# Patient Record
Sex: Female | Born: 1955
Health system: Southern US, Community
[De-identification: ages and names within clinical notes are randomized; demographics above are authoritative.]

## PROBLEM LIST (undated history)

## (undated) DIAGNOSIS — Z9889 Other specified postprocedural states: Secondary | ICD-10-CM

## (undated) DIAGNOSIS — T4145XA Adverse effect of unspecified anesthetic, initial encounter: Secondary | ICD-10-CM

## (undated) DIAGNOSIS — S83519A Sprain of anterior cruciate ligament of unspecified knee, initial encounter: Secondary | ICD-10-CM

## (undated) DIAGNOSIS — H2101 Hyphema, right eye: Secondary | ICD-10-CM

## (undated) DIAGNOSIS — Z853 Personal history of malignant neoplasm of breast: Secondary | ICD-10-CM

## (undated) DIAGNOSIS — R112 Nausea with vomiting, unspecified: Secondary | ICD-10-CM

## (undated) DIAGNOSIS — T8859XA Other complications of anesthesia, initial encounter: Secondary | ICD-10-CM

## (undated) DIAGNOSIS — E079 Disorder of thyroid, unspecified: Secondary | ICD-10-CM

## (undated) DIAGNOSIS — I10 Essential (primary) hypertension: Secondary | ICD-10-CM

## (undated) DIAGNOSIS — M1990 Unspecified osteoarthritis, unspecified site: Secondary | ICD-10-CM

## (undated) DIAGNOSIS — C801 Malignant (primary) neoplasm, unspecified: Secondary | ICD-10-CM

## (undated) DIAGNOSIS — M5124 Other intervertebral disc displacement, thoracic region: Secondary | ICD-10-CM

## (undated) HISTORY — DX: Essential (primary) hypertension: I10

## (undated) HISTORY — PX: ABDOMINAL HYSTERECTOMY: SHX81

## (undated) HISTORY — PX: BREAST SURGERY: SHX581

## (undated) HISTORY — DX: Unspecified osteoarthritis, unspecified site: M19.90

## (undated) HISTORY — DX: Personal history of malignant neoplasm of breast: Z85.3

## (undated) HISTORY — DX: Other intervertebral disc displacement, thoracic region: M51.24

## (undated) HISTORY — DX: Hyphema, right eye: H21.01

## (undated) HISTORY — PX: COSMETIC SURGERY: SHX468

## (undated) HISTORY — DX: Disorder of thyroid, unspecified: E07.9

## (undated) HISTORY — PX: SPINE SURGERY: SHX786

## (undated) HISTORY — DX: Sprain of anterior cruciate ligament of unspecified knee, initial encounter: S83.519A

## (undated) HISTORY — DX: Malignant (primary) neoplasm, unspecified: C80.1

---
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
## 1982-10-20 HISTORY — PX: BACK SURGERY: SHX140

## 1983-08-21 DIAGNOSIS — M5124 Other intervertebral disc displacement, thoracic region: Secondary | ICD-10-CM

## 1983-08-21 HISTORY — DX: Other intervertebral disc displacement, thoracic region: M51.24

## 1996-05-27 DIAGNOSIS — D229 Melanocytic nevi, unspecified: Secondary | ICD-10-CM

## 1996-05-27 HISTORY — DX: Melanocytic nevi, unspecified: D22.9

## 1999-05-03 ENCOUNTER — Encounter: Payer: Self-pay | Admitting: *Deleted

## 1999-05-03 ENCOUNTER — Ambulatory Visit (HOSPITAL_COMMUNITY): Admission: RE | Admit: 1999-05-03 | Discharge: 1999-05-03 | Payer: Self-pay

## 1999-07-07 ENCOUNTER — Encounter: Payer: Self-pay | Admitting: Internal Medicine

## 1999-07-07 ENCOUNTER — Emergency Department (HOSPITAL_COMMUNITY): Admission: EM | Admit: 1999-07-07 | Discharge: 1999-07-07 | Payer: Self-pay | Admitting: Internal Medicine

## 1999-09-16 ENCOUNTER — Encounter (INDEPENDENT_AMBULATORY_CARE_PROVIDER_SITE_OTHER): Payer: Self-pay | Admitting: Specialist

## 1999-09-16 ENCOUNTER — Inpatient Hospital Stay (HOSPITAL_COMMUNITY): Admission: AD | Admit: 1999-09-16 | Discharge: 1999-09-18 | Payer: Self-pay | Admitting: Obstetrics and Gynecology

## 2000-04-30 ENCOUNTER — Other Ambulatory Visit: Admission: RE | Admit: 2000-04-30 | Discharge: 2000-04-30 | Payer: Self-pay | Admitting: Obstetrics and Gynecology

## 2000-08-07 ENCOUNTER — Emergency Department (HOSPITAL_COMMUNITY): Admission: EM | Admit: 2000-08-07 | Discharge: 2000-08-08 | Payer: Self-pay | Admitting: Emergency Medicine

## 2000-10-20 DIAGNOSIS — S83519A Sprain of anterior cruciate ligament of unspecified knee, initial encounter: Secondary | ICD-10-CM

## 2000-10-20 HISTORY — PX: OTHER SURGICAL HISTORY: SHX169

## 2000-10-20 HISTORY — DX: Sprain of anterior cruciate ligament of unspecified knee, initial encounter: S83.519A

## 2001-02-21 ENCOUNTER — Emergency Department (HOSPITAL_COMMUNITY): Admission: EM | Admit: 2001-02-21 | Discharge: 2001-02-21 | Payer: Self-pay | Admitting: Emergency Medicine

## 2001-02-21 ENCOUNTER — Encounter: Payer: Self-pay | Admitting: Emergency Medicine

## 2001-03-25 ENCOUNTER — Ambulatory Visit (HOSPITAL_BASED_OUTPATIENT_CLINIC_OR_DEPARTMENT_OTHER): Admission: RE | Admit: 2001-03-25 | Discharge: 2001-03-25 | Payer: Self-pay | Admitting: Orthopedic Surgery

## 2001-05-06 ENCOUNTER — Other Ambulatory Visit: Admission: RE | Admit: 2001-05-06 | Discharge: 2001-05-06 | Payer: Self-pay | Admitting: Obstetrics and Gynecology

## 2002-10-20 HISTORY — PX: TUBAL LIGATION: SHX77

## 2003-01-11 ENCOUNTER — Encounter: Payer: Self-pay | Admitting: Obstetrics and Gynecology

## 2003-01-11 ENCOUNTER — Encounter: Admission: RE | Admit: 2003-01-11 | Discharge: 2003-01-11 | Payer: Self-pay | Admitting: Obstetrics and Gynecology

## 2003-02-03 ENCOUNTER — Encounter: Admission: RE | Admit: 2003-02-03 | Discharge: 2003-02-03 | Payer: Self-pay | Admitting: Obstetrics and Gynecology

## 2003-02-03 ENCOUNTER — Encounter: Payer: Self-pay | Admitting: Obstetrics and Gynecology

## 2003-02-07 ENCOUNTER — Encounter: Payer: Self-pay | Admitting: Obstetrics and Gynecology

## 2003-02-07 ENCOUNTER — Ambulatory Visit (HOSPITAL_COMMUNITY): Admission: RE | Admit: 2003-02-07 | Discharge: 2003-02-07 | Payer: Self-pay | Admitting: Obstetrics and Gynecology

## 2003-03-24 ENCOUNTER — Encounter: Payer: Self-pay | Admitting: Obstetrics and Gynecology

## 2003-03-24 ENCOUNTER — Encounter: Admission: RE | Admit: 2003-03-24 | Discharge: 2003-03-24 | Payer: Self-pay | Admitting: Obstetrics and Gynecology

## 2003-05-17 ENCOUNTER — Inpatient Hospital Stay (HOSPITAL_COMMUNITY): Admission: AD | Admit: 2003-05-17 | Discharge: 2003-05-17 | Payer: Self-pay | Admitting: Internal Medicine

## 2003-07-07 ENCOUNTER — Inpatient Hospital Stay (HOSPITAL_COMMUNITY): Admission: AD | Admit: 2003-07-07 | Discharge: 2003-07-09 | Payer: Self-pay | Admitting: Obstetrics and Gynecology

## 2003-09-13 ENCOUNTER — Ambulatory Visit (HOSPITAL_COMMUNITY): Admission: RE | Admit: 2003-09-13 | Discharge: 2003-09-13 | Payer: Self-pay | Admitting: Obstetrics and Gynecology

## 2009-10-20 DIAGNOSIS — C801 Malignant (primary) neoplasm, unspecified: Secondary | ICD-10-CM

## 2009-10-20 HISTORY — DX: Malignant (primary) neoplasm, unspecified: C80.1

## 2009-11-29 ENCOUNTER — Encounter: Admission: RE | Admit: 2009-11-29 | Discharge: 2009-11-29 | Payer: Self-pay | Admitting: Obstetrics and Gynecology

## 2009-11-30 ENCOUNTER — Encounter: Admission: RE | Admit: 2009-11-30 | Discharge: 2009-11-30 | Payer: Self-pay | Admitting: Obstetrics and Gynecology

## 2009-12-07 ENCOUNTER — Ambulatory Visit: Payer: Self-pay | Admitting: Oncology

## 2009-12-10 ENCOUNTER — Encounter: Admission: RE | Admit: 2009-12-10 | Discharge: 2009-12-10 | Payer: Self-pay | Admitting: Obstetrics and Gynecology

## 2009-12-12 LAB — CBC WITH DIFFERENTIAL/PLATELET
BASO%: 0.4 % (ref 0.0–2.0)
Basophils Absolute: 0 10*3/uL (ref 0.0–0.1)
EOS%: 3.1 % (ref 0.0–7.0)
HCT: 40.2 % (ref 34.8–46.6)
HGB: 13.8 g/dL (ref 11.6–15.9)
LYMPH%: 25.3 % (ref 14.0–49.7)
MCH: 32.3 pg (ref 25.1–34.0)
MCHC: 34.4 g/dL (ref 31.5–36.0)
NEUT%: 64 % (ref 38.4–76.8)
Platelets: 236 10*3/uL (ref 145–400)

## 2009-12-12 LAB — COMPREHENSIVE METABOLIC PANEL
ALT: 20 U/L (ref 0–35)
AST: 19 U/L (ref 0–37)
BUN: 15 mg/dL (ref 6–23)
CO2: 29 mEq/L (ref 19–32)
Calcium: 8.9 mg/dL (ref 8.4–10.5)
Chloride: 106 mEq/L (ref 96–112)
Creatinine, Ser: 0.85 mg/dL (ref 0.40–1.20)
Total Bilirubin: 0.8 mg/dL (ref 0.3–1.2)

## 2009-12-12 LAB — LACTATE DEHYDROGENASE: LDH: 136 U/L (ref 94–250)

## 2009-12-13 ENCOUNTER — Ambulatory Visit (HOSPITAL_COMMUNITY): Admission: RE | Admit: 2009-12-13 | Discharge: 2009-12-13 | Payer: Self-pay | Admitting: Oncology

## 2009-12-17 ENCOUNTER — Ambulatory Visit (HOSPITAL_COMMUNITY): Admission: RE | Admit: 2009-12-17 | Discharge: 2009-12-17 | Payer: Self-pay | Admitting: Surgery

## 2009-12-17 HISTORY — PX: PORTACATH PLACEMENT: SHX2246

## 2009-12-20 ENCOUNTER — Ambulatory Visit: Admission: RE | Admit: 2009-12-20 | Discharge: 2009-12-20 | Payer: Self-pay | Admitting: Oncology

## 2009-12-20 ENCOUNTER — Encounter: Payer: Self-pay | Admitting: Oncology

## 2010-01-02 ENCOUNTER — Ambulatory Visit: Payer: Self-pay | Admitting: Oncology

## 2010-01-02 LAB — BASIC METABOLIC PANEL
BUN: 18 mg/dL (ref 6–23)
Chloride: 102 mEq/L (ref 96–112)
Potassium: 3.9 mEq/L (ref 3.5–5.3)
Sodium: 136 mEq/L (ref 135–145)

## 2010-01-02 LAB — CBC WITH DIFFERENTIAL/PLATELET
BASO%: 0.2 % (ref 0.0–2.0)
EOS%: 1.6 % (ref 0.0–7.0)
MCH: 32 pg (ref 25.1–34.0)
MCHC: 34 g/dL (ref 31.5–36.0)
MONO#: 0.1 10*3/uL (ref 0.1–0.9)
NEUT%: 87.8 % — ABNORMAL HIGH (ref 38.4–76.8)
RBC: 4.28 10*6/uL (ref 3.70–5.45)
RDW: 13.2 % (ref 11.2–14.5)
WBC: 11.5 10*3/uL — ABNORMAL HIGH (ref 3.9–10.3)
lymph#: 1.1 10*3/uL (ref 0.9–3.3)

## 2010-01-02 LAB — TECHNOLOGIST REVIEW

## 2010-01-04 LAB — CBC WITH DIFFERENTIAL/PLATELET
Basophils Absolute: 0.1 10*3/uL (ref 0.0–0.1)
Eosinophils Absolute: 0.1 10*3/uL (ref 0.0–0.5)
HGB: 13.6 g/dL (ref 11.6–15.9)
LYMPH%: 8.9 % — ABNORMAL LOW (ref 14.0–49.7)
MCV: 92 fL (ref 79.5–101.0)
MONO#: 2.3 10*3/uL — ABNORMAL HIGH (ref 0.1–0.9)
NEUT#: 16.7 10*3/uL — ABNORMAL HIGH (ref 1.5–6.5)
Platelets: 148 10*3/uL (ref 145–400)
RBC: 4.36 10*6/uL (ref 3.70–5.45)
WBC: 21 10*3/uL — ABNORMAL HIGH (ref 3.9–10.3)

## 2010-01-04 LAB — BASIC METABOLIC PANEL
CO2: 22 mEq/L (ref 19–32)
Calcium: 9.8 mg/dL (ref 8.4–10.5)
Creatinine, Ser: 0.99 mg/dL (ref 0.40–1.20)
Potassium: 4 mEq/L (ref 3.5–5.3)
Sodium: 137 mEq/L (ref 135–145)

## 2010-01-11 LAB — CBC WITH DIFFERENTIAL/PLATELET
Basophils Absolute: 0 10*3/uL (ref 0.0–0.1)
Eosinophils Absolute: 0 10*3/uL (ref 0.0–0.5)
HGB: 12 g/dL (ref 11.6–15.9)
MCV: 92 fL (ref 79.5–101.0)
MONO#: 0.4 10*3/uL (ref 0.1–0.9)
NEUT#: 4.4 10*3/uL (ref 1.5–6.5)
RDW: 13.3 % (ref 11.2–14.5)
WBC: 6 10*3/uL (ref 3.9–10.3)
lymph#: 1.2 10*3/uL (ref 0.9–3.3)
nRBC: 0 % (ref 0–0)

## 2010-01-11 LAB — BASIC METABOLIC PANEL
CO2: 26 mEq/L (ref 19–32)
Chloride: 107 mEq/L (ref 96–112)
Glucose, Bld: 103 mg/dL — ABNORMAL HIGH (ref 70–99)
Potassium: 4 mEq/L (ref 3.5–5.3)
Sodium: 140 mEq/L (ref 135–145)

## 2010-01-25 LAB — CBC WITH DIFFERENTIAL/PLATELET
BASO%: 0.7 % (ref 0.0–2.0)
EOS%: 0.3 % (ref 0.0–7.0)
HCT: 37.7 % (ref 34.8–46.6)
LYMPH%: 7.8 % — ABNORMAL LOW (ref 14.0–49.7)
MCH: 32.9 pg (ref 25.1–34.0)
MCHC: 34.6 g/dL (ref 31.5–36.0)
MONO#: 0 10*3/uL — ABNORMAL LOW (ref 0.1–0.9)
NEUT%: 91.1 % — ABNORMAL HIGH (ref 38.4–76.8)
Platelets: 235 10*3/uL (ref 145–400)
RBC: 3.96 10*6/uL (ref 3.70–5.45)
WBC: 6.9 10*3/uL (ref 3.9–10.3)

## 2010-01-25 LAB — BASIC METABOLIC PANEL
Calcium: 8.5 mg/dL (ref 8.4–10.5)
Creatinine, Ser: 0.78 mg/dL (ref 0.40–1.20)
Sodium: 139 mEq/L (ref 135–145)

## 2010-02-01 ENCOUNTER — Ambulatory Visit: Payer: Self-pay | Admitting: Oncology

## 2010-02-01 LAB — CBC WITH DIFFERENTIAL/PLATELET
Basophils Absolute: 0 10*3/uL (ref 0.0–0.1)
EOS%: 0.7 % (ref 0.0–7.0)
Eosinophils Absolute: 0.1 10*3/uL (ref 0.0–0.5)
HCT: 37.4 % (ref 34.8–46.6)
HGB: 12.6 g/dL (ref 11.6–15.9)
MCH: 31.5 pg (ref 25.1–34.0)
MONO#: 0.9 10*3/uL (ref 0.1–0.9)
NEUT#: 5.3 10*3/uL (ref 1.5–6.5)
NEUT%: 70.9 % (ref 38.4–76.8)
RDW: 14.4 % (ref 11.2–14.5)
WBC: 7.4 10*3/uL (ref 3.9–10.3)
lymph#: 1.2 10*3/uL (ref 0.9–3.3)

## 2010-02-01 LAB — BASIC METABOLIC PANEL
BUN: 17 mg/dL (ref 6–23)
CO2: 24 mEq/L (ref 19–32)
Chloride: 104 mEq/L (ref 96–112)
Potassium: 4.1 mEq/L (ref 3.5–5.3)

## 2010-02-08 LAB — COMPREHENSIVE METABOLIC PANEL
Alkaline Phosphatase: 98 U/L (ref 39–117)
BUN: 16 mg/dL (ref 6–23)
Glucose, Bld: 106 mg/dL — ABNORMAL HIGH (ref 70–99)
Sodium: 142 mEq/L (ref 135–145)
Total Bilirubin: 0.3 mg/dL (ref 0.3–1.2)

## 2010-02-08 LAB — CBC WITH DIFFERENTIAL/PLATELET
Basophils Absolute: 0 10*3/uL (ref 0.0–0.1)
EOS%: 0.1 % (ref 0.0–7.0)
Eosinophils Absolute: 0 10*3/uL (ref 0.0–0.5)
LYMPH%: 17.6 % (ref 14.0–49.7)
MCH: 31.4 pg (ref 25.1–34.0)
MCV: 93.7 fL (ref 79.5–101.0)
MONO%: 6.3 % (ref 0.0–14.0)
NEUT#: 5.4 10*3/uL (ref 1.5–6.5)
Platelets: 100 10*3/uL — ABNORMAL LOW (ref 145–400)
RBC: 3.82 10*6/uL (ref 3.70–5.45)

## 2010-02-15 LAB — CBC WITH DIFFERENTIAL/PLATELET
Basophils Absolute: 0 10*3/uL (ref 0.0–0.1)
Eosinophils Absolute: 0 10*3/uL (ref 0.0–0.5)
HGB: 12.2 g/dL (ref 11.6–15.9)
LYMPH%: 5.3 % — ABNORMAL LOW (ref 14.0–49.7)
MCV: 94 fL (ref 79.5–101.0)
MONO%: 1 % (ref 0.0–14.0)
NEUT#: 10.3 10*3/uL — ABNORMAL HIGH (ref 1.5–6.5)
NEUT%: 93.6 % — ABNORMAL HIGH (ref 38.4–76.8)
Platelets: 204 10*3/uL (ref 145–400)

## 2010-02-15 LAB — BASIC METABOLIC PANEL
BUN: 23 mg/dL (ref 6–23)
CO2: 21 mEq/L (ref 19–32)
Calcium: 9 mg/dL (ref 8.4–10.5)
Creatinine, Ser: 0.78 mg/dL (ref 0.40–1.20)
Glucose, Bld: 158 mg/dL — ABNORMAL HIGH (ref 70–99)
Sodium: 138 mEq/L (ref 135–145)

## 2010-02-22 LAB — CBC WITH DIFFERENTIAL/PLATELET
Basophils Absolute: 0 10*3/uL (ref 0.0–0.1)
Eosinophils Absolute: 0 10*3/uL (ref 0.0–0.5)
HCT: 36.6 % (ref 34.8–46.6)
HGB: 12.5 g/dL (ref 11.6–15.9)
MCH: 32.1 pg (ref 25.1–34.0)
MONO#: 1.5 10*3/uL — ABNORMAL HIGH (ref 0.1–0.9)
NEUT#: 9.6 10*3/uL — ABNORMAL HIGH (ref 1.5–6.5)
NEUT%: 75 % (ref 38.4–76.8)
WBC: 12.8 10*3/uL — ABNORMAL HIGH (ref 3.9–10.3)
lymph#: 1.7 10*3/uL (ref 0.9–3.3)

## 2010-02-22 LAB — BASIC METABOLIC PANEL
BUN: 23 mg/dL (ref 6–23)
CO2: 27 mEq/L (ref 19–32)
Chloride: 103 mEq/L (ref 96–112)
Creatinine, Ser: 0.8 mg/dL (ref 0.40–1.20)
Glucose, Bld: 79 mg/dL (ref 70–99)

## 2010-02-25 ENCOUNTER — Ambulatory Visit (HOSPITAL_COMMUNITY): Admission: RE | Admit: 2010-02-25 | Discharge: 2010-02-25 | Payer: Self-pay | Admitting: Oncology

## 2010-03-01 LAB — CBC WITH DIFFERENTIAL/PLATELET
Eosinophils Absolute: 0 10*3/uL (ref 0.0–0.5)
HCT: 35 % (ref 34.8–46.6)
LYMPH%: 14.9 % (ref 14.0–49.7)
MCV: 96.1 fL (ref 79.5–101.0)
MONO%: 6.5 % (ref 0.0–14.0)
NEUT#: 5.4 10*3/uL (ref 1.5–6.5)
NEUT%: 77.8 % — ABNORMAL HIGH (ref 38.4–76.8)
Platelets: 117 10*3/uL — ABNORMAL LOW (ref 145–400)
RBC: 3.65 10*6/uL — ABNORMAL LOW (ref 3.70–5.45)

## 2010-03-05 ENCOUNTER — Ambulatory Visit: Payer: Self-pay | Admitting: Oncology

## 2010-03-07 LAB — CBC WITH DIFFERENTIAL/PLATELET
BASO%: 0.1 % (ref 0.0–2.0)
EOS%: 0 % (ref 0.0–7.0)
HCT: 34.8 % (ref 34.8–46.6)
LYMPH%: 5.9 % — ABNORMAL LOW (ref 14.0–49.7)
MCH: 33.5 pg (ref 25.1–34.0)
MCHC: 34.8 g/dL (ref 31.5–36.0)
MCV: 96.1 fL (ref 79.5–101.0)
MONO%: 0.6 % (ref 0.0–14.0)
NEUT%: 93.4 % — ABNORMAL HIGH (ref 38.4–76.8)
Platelets: 118 10*3/uL — ABNORMAL LOW (ref 145–400)
RBC: 3.62 10*6/uL — ABNORMAL LOW (ref 3.70–5.45)
WBC: 7.9 10*3/uL (ref 3.9–10.3)

## 2010-03-07 LAB — COMPREHENSIVE METABOLIC PANEL
ALT: 22 U/L (ref 0–35)
Alkaline Phosphatase: 82 U/L (ref 39–117)
Creatinine, Ser: 0.84 mg/dL (ref 0.40–1.20)
Sodium: 138 mEq/L (ref 135–145)
Total Bilirubin: 0.5 mg/dL (ref 0.3–1.2)
Total Protein: 6.9 g/dL (ref 6.0–8.3)

## 2010-03-15 LAB — CBC WITH DIFFERENTIAL/PLATELET
BASO%: 0.2 % (ref 0.0–2.0)
Eosinophils Absolute: 0 10*3/uL (ref 0.0–0.5)
HCT: 34 % — ABNORMAL LOW (ref 34.8–46.6)
LYMPH%: 17.1 % (ref 14.0–49.7)
MCHC: 33.8 g/dL (ref 31.5–36.0)
MCV: 95.8 fL (ref 79.5–101.0)
MONO#: 1.2 10*3/uL — ABNORMAL HIGH (ref 0.1–0.9)
MONO%: 12.3 % (ref 0.0–14.0)
NEUT%: 70.3 % (ref 38.4–76.8)
Platelets: 138 10*3/uL — ABNORMAL LOW (ref 145–400)
RBC: 3.55 10*6/uL — ABNORMAL LOW (ref 3.70–5.45)
WBC: 10 10*3/uL (ref 3.9–10.3)

## 2010-03-20 HISTORY — PX: KNEE ARTHROSCOPY WITH ANTERIOR CRUCIATE LIGAMENT (ACL) REPAIR: SHX5644

## 2010-03-22 LAB — CBC WITH DIFFERENTIAL/PLATELET
Basophils Absolute: 0.1 10*3/uL (ref 0.0–0.1)
EOS%: 0.3 % (ref 0.0–7.0)
Eosinophils Absolute: 0 10*3/uL (ref 0.0–0.5)
HCT: 31.5 % — ABNORMAL LOW (ref 34.8–46.6)
HGB: 10.9 g/dL — ABNORMAL LOW (ref 11.6–15.9)
MCH: 34 pg (ref 25.1–34.0)
MCV: 98.4 fL (ref 79.5–101.0)
NEUT#: 4.3 10*3/uL (ref 1.5–6.5)
NEUT%: 71.3 % (ref 38.4–76.8)
lymph#: 1.2 10*3/uL (ref 0.9–3.3)

## 2010-03-29 LAB — CBC WITH DIFFERENTIAL/PLATELET
Basophils Absolute: 0 10*3/uL (ref 0.0–0.1)
EOS%: 0 % (ref 0.0–7.0)
Eosinophils Absolute: 0 10*3/uL (ref 0.0–0.5)
HGB: 10.9 g/dL — ABNORMAL LOW (ref 11.6–15.9)
LYMPH%: 6.2 % — ABNORMAL LOW (ref 14.0–49.7)
MCH: 33.4 pg (ref 25.1–34.0)
MCV: 99.1 fL (ref 79.5–101.0)
MONO%: 1.4 % (ref 0.0–14.0)
NEUT#: 6.4 10*3/uL (ref 1.5–6.5)
NEUT%: 92.4 % — ABNORMAL HIGH (ref 38.4–76.8)
Platelets: 124 10*3/uL — ABNORMAL LOW (ref 145–400)
RDW: 18 % — ABNORMAL HIGH (ref 11.2–14.5)

## 2010-03-29 LAB — COMPREHENSIVE METABOLIC PANEL
ALT: 16 U/L (ref 0–35)
Albumin: 4.3 g/dL (ref 3.5–5.2)
CO2: 20 mEq/L (ref 19–32)
Calcium: 8.8 mg/dL (ref 8.4–10.5)
Chloride: 108 mEq/L (ref 96–112)
Potassium: 4 mEq/L (ref 3.5–5.3)
Sodium: 140 mEq/L (ref 135–145)
Total Protein: 7 g/dL (ref 6.0–8.3)

## 2010-04-05 ENCOUNTER — Ambulatory Visit: Payer: Self-pay | Admitting: Oncology

## 2010-04-05 LAB — CBC WITH DIFFERENTIAL/PLATELET
BASO%: 0.3 % (ref 0.0–2.0)
LYMPH%: 15.4 % (ref 14.0–49.7)
MCHC: 33.8 g/dL (ref 31.5–36.0)
MONO#: 0.4 10*3/uL (ref 0.1–0.9)
Platelets: 146 10*3/uL (ref 145–400)
RBC: 3.32 10*6/uL — ABNORMAL LOW (ref 3.70–5.45)
WBC: 6.4 10*3/uL (ref 3.9–10.3)
lymph#: 1 10*3/uL (ref 0.9–3.3)

## 2010-04-11 ENCOUNTER — Encounter: Payer: Self-pay | Admitting: Oncology

## 2010-04-11 ENCOUNTER — Ambulatory Visit: Admission: RE | Admit: 2010-04-11 | Discharge: 2010-04-11 | Payer: Self-pay | Admitting: Oncology

## 2010-04-12 LAB — CBC WITH DIFFERENTIAL/PLATELET
BASO%: 1.1 % (ref 0.0–2.0)
HCT: 32.3 % — ABNORMAL LOW (ref 34.8–46.6)
MCHC: 34.4 g/dL (ref 31.5–36.0)
MONO#: 0.5 10*3/uL (ref 0.1–0.9)
NEUT%: 79.2 % — ABNORMAL HIGH (ref 38.4–76.8)
WBC: 8.8 10*3/uL (ref 3.9–10.3)
lymph#: 1.2 10*3/uL (ref 0.9–3.3)

## 2010-04-19 LAB — COMPREHENSIVE METABOLIC PANEL
Albumin: 4.2 g/dL (ref 3.5–5.2)
Alkaline Phosphatase: 83 U/L (ref 39–117)
CO2: 21 mEq/L (ref 19–32)
Glucose, Bld: 123 mg/dL — ABNORMAL HIGH (ref 70–99)
Potassium: 4.1 mEq/L (ref 3.5–5.3)
Sodium: 139 mEq/L (ref 135–145)
Total Protein: 7.1 g/dL (ref 6.0–8.3)

## 2010-04-19 LAB — CBC WITH DIFFERENTIAL/PLATELET
BASO%: 0.6 % (ref 0.0–2.0)
EOS%: 0 % (ref 0.0–7.0)
Eosinophils Absolute: 0 10*3/uL (ref 0.0–0.5)
MCH: 34.8 pg — ABNORMAL HIGH (ref 25.1–34.0)
MCV: 103 fL — ABNORMAL HIGH (ref 79.5–101.0)
MONO%: 2.8 % (ref 0.0–14.0)
NEUT#: 10.3 10*3/uL — ABNORMAL HIGH (ref 1.5–6.5)
RBC: 3.21 10*6/uL — ABNORMAL LOW (ref 3.70–5.45)
RDW: 18.5 % — ABNORMAL HIGH (ref 11.2–14.5)

## 2010-04-23 ENCOUNTER — Ambulatory Visit (HOSPITAL_COMMUNITY): Admission: RE | Admit: 2010-04-23 | Discharge: 2010-04-23 | Payer: Self-pay | Admitting: Oncology

## 2010-04-26 LAB — CBC WITH DIFFERENTIAL/PLATELET
Eosinophils Absolute: 0 10*3/uL (ref 0.0–0.5)
MONO#: 1.1 10*3/uL — ABNORMAL HIGH (ref 0.1–0.9)
NEUT#: 6.6 10*3/uL — ABNORMAL HIGH (ref 1.5–6.5)
RBC: 3.12 10*6/uL — ABNORMAL LOW (ref 3.70–5.45)
RDW: 16.1 % — ABNORMAL HIGH (ref 11.2–14.5)
WBC: 9 10*3/uL (ref 3.9–10.3)
lymph#: 1.3 10*3/uL (ref 0.9–3.3)

## 2010-05-03 LAB — CBC WITH DIFFERENTIAL/PLATELET
Eosinophils Absolute: 0 10*3/uL (ref 0.0–0.5)
HCT: 32.7 % — ABNORMAL LOW (ref 34.8–46.6)
LYMPH%: 17.6 % (ref 14.0–49.7)
MONO#: 0.6 10*3/uL (ref 0.1–0.9)
NEUT#: 5.9 10*3/uL (ref 1.5–6.5)
NEUT%: 74.2 % (ref 38.4–76.8)
Platelets: 103 10*3/uL — ABNORMAL LOW (ref 145–400)
WBC: 8 10*3/uL (ref 3.9–10.3)
lymph#: 1.4 10*3/uL (ref 0.9–3.3)
nRBC: 0 % (ref 0–0)

## 2010-05-22 ENCOUNTER — Encounter (INDEPENDENT_AMBULATORY_CARE_PROVIDER_SITE_OTHER): Payer: Self-pay | Admitting: Surgery

## 2010-05-22 ENCOUNTER — Inpatient Hospital Stay (HOSPITAL_COMMUNITY): Admission: RE | Admit: 2010-05-22 | Discharge: 2010-05-24 | Payer: Self-pay | Admitting: Surgery

## 2010-05-22 ENCOUNTER — Other Ambulatory Visit: Payer: Self-pay | Admitting: Surgery

## 2010-05-22 HISTORY — PX: MASTECTOMY: SHX3

## 2010-05-28 ENCOUNTER — Ambulatory Visit: Admission: RE | Admit: 2010-05-28 | Discharge: 2010-08-19 | Payer: Self-pay | Admitting: Radiation Oncology

## 2010-05-30 ENCOUNTER — Ambulatory Visit: Payer: Self-pay | Admitting: Oncology

## 2010-06-03 LAB — COMPREHENSIVE METABOLIC PANEL
Albumin: 3.8 g/dL (ref 3.5–5.2)
CO2: 26 mEq/L (ref 19–32)
Calcium: 9.3 mg/dL (ref 8.4–10.5)
Glucose, Bld: 112 mg/dL — ABNORMAL HIGH (ref 70–99)
Potassium: 4 mEq/L (ref 3.5–5.3)
Sodium: 141 mEq/L (ref 135–145)
Total Protein: 6.2 g/dL (ref 6.0–8.3)

## 2010-06-03 LAB — CBC WITH DIFFERENTIAL/PLATELET
Eosinophils Absolute: 0.2 10*3/uL (ref 0.0–0.5)
HGB: 11.4 g/dL — ABNORMAL LOW (ref 11.6–15.9)
LYMPH%: 15.3 % (ref 14.0–49.7)
MONO#: 0.3 10*3/uL (ref 0.1–0.9)
NEUT#: 3.2 10*3/uL (ref 1.5–6.5)
Platelets: 192 10*3/uL (ref 145–400)
RBC: 3.16 10*6/uL — ABNORMAL LOW (ref 3.70–5.45)
RDW: 13.6 % (ref 11.2–14.5)
WBC: 4.5 10*3/uL (ref 3.9–10.3)

## 2010-06-03 LAB — LACTATE DEHYDROGENASE: LDH: 153 U/L (ref 94–250)

## 2010-06-28 LAB — CBC WITH DIFFERENTIAL/PLATELET
BASO%: 0.7 % (ref 0.0–2.0)
LYMPH%: 23 % (ref 14.0–49.7)
MCH: 33.3 pg (ref 25.1–34.0)
MCHC: 33.3 g/dL (ref 31.5–36.0)
MCV: 100 fL (ref 79.5–101.0)
MONO%: 7.6 % (ref 0.0–14.0)
Platelets: 144 10*3/uL — ABNORMAL LOW (ref 145–400)
RBC: 3.81 10*6/uL (ref 3.70–5.45)

## 2010-06-28 LAB — COMPREHENSIVE METABOLIC PANEL
ALT: 9 U/L (ref 0–35)
Alkaline Phosphatase: 53 U/L (ref 39–117)
Glucose, Bld: 79 mg/dL (ref 70–99)
Sodium: 142 mEq/L (ref 135–145)
Total Bilirubin: 0.4 mg/dL (ref 0.3–1.2)
Total Protein: 6.1 g/dL (ref 6.0–8.3)

## 2010-07-03 ENCOUNTER — Ambulatory Visit: Payer: Self-pay | Admitting: Oncology

## 2010-07-05 LAB — CBC WITH DIFFERENTIAL/PLATELET
Basophils Absolute: 0 10*3/uL (ref 0.0–0.1)
Eosinophils Absolute: 0.1 10*3/uL (ref 0.0–0.5)
LYMPH%: 29.4 % (ref 14.0–49.7)
MCV: 99.7 fL (ref 79.5–101.0)
MONO%: 6.5 % (ref 0.0–14.0)
NEUT#: 2.4 10*3/uL (ref 1.5–6.5)
NEUT%: 60.4 % (ref 38.4–76.8)
Platelets: 146 10*3/uL (ref 145–400)
RBC: 3.97 10*6/uL (ref 3.70–5.45)
nRBC: 0 % (ref 0–0)

## 2010-07-12 LAB — CBC WITH DIFFERENTIAL/PLATELET
BASO%: 0.4 % (ref 0.0–2.0)
Basophils Absolute: 0 10*3/uL (ref 0.0–0.1)
EOS%: 2.8 % (ref 0.0–7.0)
Eosinophils Absolute: 0.1 10*3/uL (ref 0.0–0.5)
HCT: 39.1 % (ref 34.8–46.6)
HGB: 13.1 g/dL (ref 11.6–15.9)
LYMPH%: 22.3 % (ref 14.0–49.7)
MCH: 32.9 pg (ref 25.1–34.0)
MCHC: 33.5 g/dL (ref 31.5–36.0)
MCV: 98.2 fL (ref 79.5–101.0)
MONO#: 0.3 10*3/uL (ref 0.1–0.9)
MONO%: 6.5 % (ref 0.0–14.0)
NEUT#: 3.4 10*3/uL (ref 1.5–6.5)
NEUT%: 68 % (ref 38.4–76.8)
Platelets: 139 10*3/uL — ABNORMAL LOW (ref 145–400)
RBC: 3.98 10*6/uL (ref 3.70–5.45)
RDW: 12 % (ref 11.2–14.5)
WBC: 4.9 10*3/uL (ref 3.9–10.3)
lymph#: 1.1 10*3/uL (ref 0.9–3.3)

## 2010-07-19 LAB — CBC WITH DIFFERENTIAL/PLATELET
Basophils Absolute: 0 10*3/uL (ref 0.0–0.1)
Eosinophils Absolute: 0.2 10*3/uL (ref 0.0–0.5)
HCT: 37.6 % (ref 34.8–46.6)
HGB: 12.7 g/dL (ref 11.6–15.9)
LYMPH%: 22.6 % (ref 14.0–49.7)
MONO#: 0.3 10*3/uL (ref 0.1–0.9)
NEUT#: 2.4 10*3/uL (ref 1.5–6.5)
NEUT%: 63.4 % (ref 38.4–76.8)
Platelets: 138 10*3/uL — ABNORMAL LOW (ref 145–400)
RBC: 3.88 10*6/uL (ref 3.70–5.45)
WBC: 3.7 10*3/uL — ABNORMAL LOW (ref 3.9–10.3)

## 2010-07-19 LAB — COMPREHENSIVE METABOLIC PANEL
BUN: 17 mg/dL (ref 6–23)
CO2: 29 mEq/L (ref 19–32)
Glucose, Bld: 96 mg/dL (ref 70–99)
Sodium: 141 mEq/L (ref 135–145)
Total Bilirubin: 0.8 mg/dL (ref 0.3–1.2)
Total Protein: 6.9 g/dL (ref 6.0–8.3)

## 2010-07-25 ENCOUNTER — Ambulatory Visit: Payer: Self-pay | Admitting: Internal Medicine

## 2010-07-25 ENCOUNTER — Ambulatory Visit: Admission: RE | Admit: 2010-07-25 | Discharge: 2010-07-25 | Payer: Self-pay | Admitting: Oncology

## 2010-07-25 ENCOUNTER — Encounter: Payer: Self-pay | Admitting: Oncology

## 2010-07-26 LAB — CBC WITH DIFFERENTIAL/PLATELET
BASO%: 0.2 % (ref 0.0–2.0)
Basophils Absolute: 0 10*3/uL (ref 0.0–0.1)
EOS%: 3.1 % (ref 0.0–7.0)
Eosinophils Absolute: 0.1 10*3/uL (ref 0.0–0.5)
HCT: 39.1 % (ref 34.8–46.6)
HGB: 13.4 g/dL (ref 11.6–15.9)
LYMPH%: 18.2 % (ref 14.0–49.7)
MCH: 33.1 pg (ref 25.1–34.0)
MCHC: 34.3 g/dL (ref 31.5–36.0)
MCV: 96.5 fL (ref 79.5–101.0)
MONO#: 0.5 10*3/uL (ref 0.1–0.9)
MONO%: 10 % (ref 0.0–14.0)
NEUT#: 3.1 10*3/uL (ref 1.5–6.5)
NEUT%: 68.5 % (ref 38.4–76.8)
Platelets: 151 10*3/uL (ref 145–400)
RBC: 4.05 10*6/uL (ref 3.70–5.45)
RDW: 12.1 % (ref 11.2–14.5)
WBC: 4.5 10*3/uL (ref 3.9–10.3)
lymph#: 0.8 10*3/uL — ABNORMAL LOW (ref 0.9–3.3)

## 2010-08-02 ENCOUNTER — Ambulatory Visit: Payer: Self-pay | Admitting: Oncology

## 2010-08-02 LAB — CBC WITH DIFFERENTIAL/PLATELET
BASO%: 0.7 % (ref 0.0–2.0)
Basophils Absolute: 0 10*3/uL (ref 0.0–0.1)
EOS%: 2.9 % (ref 0.0–7.0)
Eosinophils Absolute: 0.1 10*3/uL (ref 0.0–0.5)
HCT: 38.5 % (ref 34.8–46.6)
HGB: 13 g/dL (ref 11.6–15.9)
LYMPH%: 17.4 % (ref 14.0–49.7)
MCH: 32.4 pg (ref 25.1–34.0)
MCHC: 33.8 g/dL (ref 31.5–36.0)
MCV: 96 fL (ref 79.5–101.0)
MONO#: 0.4 10*3/uL (ref 0.1–0.9)
MONO%: 7.9 % (ref 0.0–14.0)
NEUT#: 3.2 10*3/uL (ref 1.5–6.5)
NEUT%: 71.1 % (ref 38.4–76.8)
Platelets: 135 10*3/uL — ABNORMAL LOW (ref 145–400)
RBC: 4.01 10*6/uL (ref 3.70–5.45)
RDW: 12.2 % (ref 11.2–14.5)
WBC: 4.4 10*3/uL (ref 3.9–10.3)
lymph#: 0.8 10*3/uL — ABNORMAL LOW (ref 0.9–3.3)
nRBC: 0 % (ref 0–0)

## 2010-08-09 LAB — CBC WITH DIFFERENTIAL/PLATELET
BASO%: 0.5 % (ref 0.0–2.0)
Basophils Absolute: 0 10*3/uL (ref 0.0–0.1)
EOS%: 2.5 % (ref 0.0–7.0)
Eosinophils Absolute: 0.2 10*3/uL (ref 0.0–0.5)
HCT: 39.8 % (ref 34.8–46.6)
HGB: 13.5 g/dL (ref 11.6–15.9)
LYMPH%: 11.5 % — ABNORMAL LOW (ref 14.0–49.7)
MCH: 32.2 pg (ref 25.1–34.0)
MCHC: 33.9 g/dL (ref 31.5–36.0)
MCV: 95 fL (ref 79.5–101.0)
MONO#: 0.5 10*3/uL (ref 0.1–0.9)
MONO%: 8.3 % (ref 0.0–14.0)
NEUT#: 4.9 10*3/uL (ref 1.5–6.5)
NEUT%: 77.2 % — ABNORMAL HIGH (ref 38.4–76.8)
Platelets: 137 10*3/uL — ABNORMAL LOW (ref 145–400)
RBC: 4.19 10*6/uL (ref 3.70–5.45)
RDW: 12.4 % (ref 11.2–14.5)
WBC: 6.4 10*3/uL (ref 3.9–10.3)
lymph#: 0.7 10*3/uL — ABNORMAL LOW (ref 0.9–3.3)
nRBC: 0 % (ref 0–0)

## 2010-08-14 LAB — CBC WITH DIFFERENTIAL/PLATELET
BASO%: 0.3 % (ref 0.0–2.0)
Basophils Absolute: 0 10*3/uL (ref 0.0–0.1)
EOS%: 3.6 % (ref 0.0–7.0)
Eosinophils Absolute: 0.1 10*3/uL (ref 0.0–0.5)
HCT: 36.5 % (ref 34.8–46.6)
HGB: 12.7 g/dL (ref 11.6–15.9)
LYMPH%: 15.4 % (ref 14.0–49.7)
MCH: 33.4 pg (ref 25.1–34.0)
MCHC: 34.7 g/dL (ref 31.5–36.0)
MCV: 96.2 fL (ref 79.5–101.0)
MONO#: 0.3 10*3/uL (ref 0.1–0.9)
MONO%: 7.5 % (ref 0.0–14.0)
NEUT#: 3 10*3/uL (ref 1.5–6.5)
NEUT%: 73.2 % (ref 38.4–76.8)
Platelets: 157 10*3/uL (ref 145–400)
RBC: 3.8 10*6/uL (ref 3.70–5.45)
RDW: 12.6 % (ref 11.2–14.5)
WBC: 4.1 10*3/uL (ref 3.9–10.3)
lymph#: 0.6 10*3/uL — ABNORMAL LOW (ref 0.9–3.3)

## 2010-08-14 LAB — COMPREHENSIVE METABOLIC PANEL
ALT: 14 U/L (ref 0–35)
AST: 16 U/L (ref 0–37)
Albumin: 3.5 g/dL (ref 3.5–5.2)
Alkaline Phosphatase: 56 U/L (ref 39–117)
BUN: 21 mg/dL (ref 6–23)
CO2: 26 mEq/L (ref 19–32)
Calcium: 8.8 mg/dL (ref 8.4–10.5)
Chloride: 105 mEq/L (ref 96–112)
Creatinine, Ser: 0.83 mg/dL (ref 0.40–1.20)
Glucose, Bld: 109 mg/dL — ABNORMAL HIGH (ref 70–99)
Potassium: 3.4 mEq/L — ABNORMAL LOW (ref 3.5–5.3)
Sodium: 139 mEq/L (ref 135–145)
Total Bilirubin: 0.4 mg/dL (ref 0.3–1.2)
Total Protein: 6.7 g/dL (ref 6.0–8.3)

## 2010-08-14 LAB — CANCER ANTIGEN 27.29: CA 27.29: 32 U/mL (ref 0–39)

## 2010-09-04 ENCOUNTER — Ambulatory Visit: Payer: Self-pay | Admitting: Oncology

## 2010-09-06 LAB — CBC WITH DIFFERENTIAL/PLATELET
BASO%: 0.6 % (ref 0.0–2.0)
HCT: 37.8 % (ref 34.8–46.6)
MCHC: 34.4 g/dL (ref 31.5–36.0)
MONO#: 0.4 10*3/uL (ref 0.1–0.9)
NEUT%: 66.5 % (ref 38.4–76.8)
RBC: 4.09 10*6/uL (ref 3.70–5.45)
RDW: 12.9 % (ref 11.2–14.5)
WBC: 5.1 10*3/uL (ref 3.9–10.3)
lymph#: 1 10*3/uL (ref 0.9–3.3)

## 2010-09-06 LAB — BASIC METABOLIC PANEL
CO2: 28 mEq/L (ref 19–32)
Chloride: 104 mEq/L (ref 96–112)
Potassium: 3.8 mEq/L (ref 3.5–5.3)
Sodium: 141 mEq/L (ref 135–145)

## 2010-09-27 LAB — CBC WITH DIFFERENTIAL/PLATELET
Eosinophils Absolute: 0.2 10*3/uL (ref 0.0–0.5)
MONO#: 0.4 10*3/uL (ref 0.1–0.9)
NEUT#: 3.3 10*3/uL (ref 1.5–6.5)
RBC: 4.04 10*6/uL (ref 3.70–5.45)
RDW: 13.7 % (ref 11.2–14.5)
WBC: 4.8 10*3/uL (ref 3.9–10.3)
lymph#: 0.9 10*3/uL (ref 0.9–3.3)
nRBC: 0 % (ref 0–0)

## 2010-10-16 ENCOUNTER — Ambulatory Visit: Payer: Self-pay | Admitting: Oncology

## 2010-10-18 LAB — COMPREHENSIVE METABOLIC PANEL
ALT: 19 U/L (ref 0–35)
CO2: 27 mEq/L (ref 19–32)
Calcium: 9.1 mg/dL (ref 8.4–10.5)
Chloride: 108 mEq/L (ref 96–112)
Potassium: 3.7 mEq/L (ref 3.5–5.3)
Sodium: 141 mEq/L (ref 135–145)
Total Bilirubin: 0.6 mg/dL (ref 0.3–1.2)
Total Protein: 6.3 g/dL (ref 6.0–8.3)

## 2010-10-18 LAB — CBC WITH DIFFERENTIAL/PLATELET
BASO%: 0.4 % (ref 0.0–2.0)
LYMPH%: 16.2 % (ref 14.0–49.7)
MCHC: 34.4 g/dL (ref 31.5–36.0)
MONO#: 0.3 10*3/uL (ref 0.1–0.9)
Platelets: 154 10*3/uL (ref 145–400)
RBC: 4.06 10*6/uL (ref 3.70–5.45)
WBC: 5.7 10*3/uL (ref 3.9–10.3)

## 2010-10-22 ENCOUNTER — Other Ambulatory Visit: Payer: Self-pay | Admitting: Oncology

## 2010-10-22 ENCOUNTER — Ambulatory Visit
Admission: RE | Admit: 2010-10-22 | Discharge: 2010-10-22 | Payer: Self-pay | Source: Home / Self Care | Attending: Oncology | Admitting: Oncology

## 2010-10-30 ENCOUNTER — Encounter (INDEPENDENT_AMBULATORY_CARE_PROVIDER_SITE_OTHER): Payer: Self-pay | Admitting: Surgery

## 2010-10-30 ENCOUNTER — Ambulatory Visit (HOSPITAL_COMMUNITY)
Admission: RE | Admit: 2010-10-30 | Discharge: 2010-10-31 | Payer: Self-pay | Source: Home / Self Care | Attending: Surgery | Admitting: Surgery

## 2010-10-30 HISTORY — PX: MASTECTOMY: SHX3

## 2010-11-04 LAB — DIFFERENTIAL
Basophils Absolute: 0 10*3/uL (ref 0.0–0.1)
Basophils Relative: 0 % (ref 0–1)
Eosinophils Absolute: 0.4 10*3/uL (ref 0.0–0.7)
Eosinophils Relative: 7 % — ABNORMAL HIGH (ref 0–5)
Lymphocytes Relative: 17 % (ref 12–46)
Lymphs Abs: 0.9 10*3/uL (ref 0.7–4.0)
Monocytes Absolute: 0.3 10*3/uL (ref 0.1–1.0)
Monocytes Relative: 5 % (ref 3–12)
Neutro Abs: 3.6 10*3/uL (ref 1.7–7.7)
Neutrophils Relative %: 70 % (ref 43–77)

## 2010-11-04 LAB — CBC
HCT: 38.6 % (ref 36.0–46.0)
Hemoglobin: 12.8 g/dL (ref 12.0–15.0)
MCH: 31.9 pg (ref 26.0–34.0)
MCHC: 33.2 g/dL (ref 30.0–36.0)
MCV: 96.3 fL (ref 78.0–100.0)
Platelets: 195 10*3/uL (ref 150–400)
RBC: 4.01 MIL/uL (ref 3.87–5.11)
RDW: 13.6 % (ref 11.5–15.5)
WBC: 5.1 10*3/uL (ref 4.0–10.5)

## 2010-11-04 LAB — URINALYSIS, ROUTINE W REFLEX MICROSCOPIC
Bilirubin Urine: NEGATIVE
Ketones, ur: NEGATIVE mg/dL
Leukocytes, UA: NEGATIVE
Nitrite: NEGATIVE
Protein, ur: NEGATIVE mg/dL
Specific Gravity, Urine: 1.01 (ref 1.005–1.030)
Urine Glucose, Fasting: NEGATIVE mg/dL
Urobilinogen, UA: 0.2 mg/dL (ref 0.0–1.0)
pH: 5.5 (ref 5.0–8.0)

## 2010-11-04 LAB — BASIC METABOLIC PANEL
BUN: 16 mg/dL (ref 6–23)
CO2: 29 mEq/L (ref 19–32)
Calcium: 9.6 mg/dL (ref 8.4–10.5)
Chloride: 105 mEq/L (ref 96–112)
Creatinine, Ser: 0.92 mg/dL (ref 0.4–1.2)
GFR calc Af Amer: >60 mL/min (ref >60)
GFR calc non Af Amer: >60 mL/min (ref >60)
Glucose, Bld: 99 mg/dL (ref 70–99)
Potassium: 3.9 mEq/L (ref 3.5–5.1)
Sodium: 142 mEq/L (ref 135–145)

## 2010-11-04 LAB — SURGICAL PCR SCREEN
MRSA, PCR: NEGATIVE
Staphylococcus aureus: POSITIVE — AB

## 2010-11-04 LAB — URINE MICROSCOPIC-ADD ON

## 2010-11-08 ENCOUNTER — Ambulatory Visit: Payer: Self-pay | Admitting: Oncology

## 2010-11-08 LAB — CBC WITH DIFFERENTIAL/PLATELET
BASO%: 0.4 % (ref 0.0–2.0)
Basophils Absolute: 0 10*3/uL (ref 0.0–0.1)
EOS%: 7.8 % — ABNORMAL HIGH (ref 0.0–7.0)
Eosinophils Absolute: 0.4 10*3/uL (ref 0.0–0.5)
HCT: 35.5 % (ref 34.8–46.6)
HGB: 12 g/dL (ref 11.6–15.9)
LYMPH%: 19.5 % (ref 14.0–49.7)
MCH: 31.6 pg (ref 25.1–34.0)
MCHC: 33.8 g/dL (ref 31.5–36.0)
MCV: 93.4 fL (ref 79.5–101.0)
MONO#: 0.4 10*3/uL (ref 0.1–0.9)
MONO%: 6.9 % (ref 0.0–14.0)
NEUT#: 3.4 10*3/uL (ref 1.5–6.5)
NEUT%: 65.4 % (ref 38.4–76.8)
Platelets: 163 10*3/uL (ref 145–400)
RBC: 3.8 10*6/uL (ref 3.70–5.45)
RDW: 13.7 % (ref 11.2–14.5)
WBC: 5.2 10*3/uL (ref 3.9–10.3)
lymph#: 1 10*3/uL (ref 0.9–3.3)

## 2010-11-22 ENCOUNTER — Ambulatory Visit (HOSPITAL_COMMUNITY)
Admission: EM | Admit: 2010-11-22 | Discharge: 2010-11-22 | Disposition: A | Discharge status: Home / Self Care | Payer: BC Managed Care – PPO | Attending: Emergency Medicine | Admitting: Emergency Medicine

## 2010-11-22 ENCOUNTER — Emergency Department (HOSPITAL_COMMUNITY): Payer: BC Managed Care – PPO

## 2010-11-22 DIAGNOSIS — R0602 Shortness of breath: Secondary | ICD-10-CM | POA: Insufficient documentation

## 2010-11-22 DIAGNOSIS — Z853 Personal history of malignant neoplasm of breast: Secondary | ICD-10-CM | POA: Insufficient documentation

## 2010-11-22 DIAGNOSIS — R079 Chest pain, unspecified: Secondary | ICD-10-CM | POA: Insufficient documentation

## 2010-11-22 MED ORDER — IOHEXOL 300 MG/ML  SOLN: 100.0000 mL | Freq: Once | INTRAMUSCULAR | Status: AC | PRN

## 2010-11-29 ENCOUNTER — Other Ambulatory Visit: Payer: Self-pay | Admitting: Oncology

## 2010-11-29 ENCOUNTER — Encounter (HOSPITAL_BASED_OUTPATIENT_CLINIC_OR_DEPARTMENT_OTHER): Payer: BC Managed Care – PPO | Admitting: Oncology

## 2010-11-29 DIAGNOSIS — C50419 Malignant neoplasm of upper-outer quadrant of unspecified female breast: Secondary | ICD-10-CM

## 2010-11-29 DIAGNOSIS — Z5112 Encounter for antineoplastic immunotherapy: Secondary | ICD-10-CM

## 2010-11-29 LAB — CBC WITH DIFFERENTIAL/PLATELET
BASO%: 0.1 % (ref 0.0–2.0)
LYMPH%: 15.1 % (ref 14.0–49.7)
MCHC: 33.5 g/dL (ref 31.5–36.0)
MCV: 95.2 fL (ref 79.5–101.0)
MONO#: 0.2 10*3/uL (ref 0.1–0.9)
MONO%: 3.7 % (ref 0.0–14.0)
Platelets: 188 10*3/uL (ref 145–400)
RBC: 3.96 10*6/uL (ref 3.70–5.45)
RDW: 13.3 % (ref 11.2–14.5)
WBC: 5.7 10*3/uL (ref 3.9–10.3)

## 2010-11-29 LAB — COMPREHENSIVE METABOLIC PANEL
ALT: 18 U/L (ref 0–35)
Alkaline Phosphatase: 76 U/L (ref 39–117)
Sodium: 142 mEq/L (ref 135–145)
Total Bilirubin: 0.6 mg/dL (ref 0.3–1.2)
Total Protein: 6.6 g/dL (ref 6.0–8.3)

## 2010-12-27 ENCOUNTER — Encounter (HOSPITAL_BASED_OUTPATIENT_CLINIC_OR_DEPARTMENT_OTHER): Payer: BC Managed Care – PPO | Admitting: Oncology

## 2010-12-27 ENCOUNTER — Other Ambulatory Visit: Payer: Self-pay | Admitting: Physician Assistant

## 2010-12-27 DIAGNOSIS — Z5112 Encounter for antineoplastic immunotherapy: Secondary | ICD-10-CM

## 2010-12-27 DIAGNOSIS — C50419 Malignant neoplasm of upper-outer quadrant of unspecified female breast: Secondary | ICD-10-CM

## 2010-12-27 LAB — CBC WITH DIFFERENTIAL/PLATELET
Basophils Absolute: 0 10*3/uL (ref 0.0–0.1)
EOS%: 6.1 % (ref 0.0–7.0)
Eosinophils Absolute: 0.3 10*3/uL (ref 0.0–0.5)
HCT: 38.8 % (ref 34.8–46.6)
HGB: 13.3 g/dL (ref 11.6–15.9)
MCH: 32 pg (ref 25.1–34.0)
MCV: 93.4 fL (ref 79.5–101.0)
MONO%: 6.1 % (ref 0.0–14.0)
NEUT%: 64 % (ref 38.4–76.8)

## 2010-12-27 LAB — COMPREHENSIVE METABOLIC PANEL
ALT: 18 U/L (ref 0–35)
AST: 16 U/L (ref 0–37)
Albumin: 4.4 g/dL (ref 3.5–5.2)
Calcium: 9.5 mg/dL (ref 8.4–10.5)
Chloride: 104 mEq/L (ref 96–112)
Potassium: 4 mEq/L (ref 3.5–5.3)

## 2011-01-03 LAB — CBC
MCH: 35.4 pg — ABNORMAL HIGH (ref 26.0–34.0)
MCHC: 33.1 g/dL (ref 30.0–36.0)
MCV: 106.8 fL — ABNORMAL HIGH (ref 78.0–100.0)
Platelets: 174 10*3/uL (ref 150–400)
RDW: 14.6 % (ref 11.5–15.5)

## 2011-01-03 LAB — URINALYSIS, ROUTINE W REFLEX MICROSCOPIC
Glucose, UA: NEGATIVE mg/dL
Specific Gravity, Urine: 1.013 (ref 1.005–1.030)
Urobilinogen, UA: 0.2 mg/dL (ref 0.0–1.0)

## 2011-01-03 LAB — SURGICAL PCR SCREEN
MRSA, PCR: NEGATIVE
Staphylococcus aureus: POSITIVE — AB

## 2011-01-03 LAB — COMPREHENSIVE METABOLIC PANEL
Albumin: 3.8 g/dL (ref 3.5–5.2)
BUN: 12 mg/dL (ref 6–23)
Calcium: 9.6 mg/dL (ref 8.4–10.5)
Creatinine, Ser: 0.83 mg/dL (ref 0.4–1.2)
Total Bilirubin: 0.7 mg/dL (ref 0.3–1.2)
Total Protein: 7 g/dL (ref 6.0–8.3)

## 2011-01-03 LAB — URINE MICROSCOPIC-ADD ON

## 2011-01-08 LAB — GLUCOSE, CAPILLARY: Glucose-Capillary: 85 mg/dL (ref 70–99)

## 2011-01-10 LAB — CBC
HCT: 40.1 % (ref 36.0–46.0)
Platelets: 182 10*3/uL (ref 150–400)
WBC: 4.7 10*3/uL (ref 4.0–10.5)

## 2011-01-10 LAB — BASIC METABOLIC PANEL
BUN: 16 mg/dL (ref 6–23)
Calcium: 9.1 mg/dL (ref 8.4–10.5)
GFR calc non Af Amer: >60 mL/min (ref >60)
Potassium: 4.4 mEq/L (ref 3.5–5.1)

## 2011-01-30 ENCOUNTER — Other Ambulatory Visit: Payer: Self-pay | Admitting: Oncology

## 2011-01-30 ENCOUNTER — Encounter (HOSPITAL_BASED_OUTPATIENT_CLINIC_OR_DEPARTMENT_OTHER): Payer: BC Managed Care – PPO | Admitting: Oncology

## 2011-01-30 DIAGNOSIS — Z5112 Encounter for antineoplastic immunotherapy: Secondary | ICD-10-CM

## 2011-01-30 DIAGNOSIS — C50419 Malignant neoplasm of upper-outer quadrant of unspecified female breast: Secondary | ICD-10-CM

## 2011-01-30 LAB — CBC WITH DIFFERENTIAL/PLATELET
Basophils Absolute: 0 10*3/uL (ref 0.0–0.1)
Eosinophils Absolute: 0.2 10*3/uL (ref 0.0–0.5)
HCT: 40.4 % (ref 34.8–46.6)
HGB: 13.8 g/dL (ref 11.6–15.9)
MCH: 31.5 pg (ref 25.1–34.0)
MONO#: 0.3 10*3/uL (ref 0.1–0.9)
NEUT#: 2.7 10*3/uL (ref 1.5–6.5)
NEUT%: 68.3 % (ref 38.4–76.8)
RDW: 12.9 % (ref 11.2–14.5)
WBC: 4 10*3/uL (ref 3.9–10.3)
lymph#: 0.8 10*3/uL — ABNORMAL LOW (ref 0.9–3.3)

## 2011-01-30 LAB — COMPREHENSIVE METABOLIC PANEL
Albumin: 3.8 g/dL (ref 3.5–5.2)
BUN: 19 mg/dL (ref 6–23)
CO2: 25 mEq/L (ref 19–32)
Calcium: 9.7 mg/dL (ref 8.4–10.5)
Chloride: 106 mEq/L (ref 96–112)
Creatinine, Ser: 1.09 mg/dL (ref 0.40–1.20)
Glucose, Bld: 106 mg/dL — ABNORMAL HIGH (ref 70–99)
Potassium: 4.3 mEq/L (ref 3.5–5.3)

## 2011-01-30 LAB — VITAMIN D 25 HYDROXY (VIT D DEFICIENCY, FRACTURES): Vit D, 25-Hydroxy: 32 ng/mL (ref 30–89)

## 2011-01-30 LAB — LACTATE DEHYDROGENASE: LDH: 159 U/L (ref 94–250)

## 2011-02-20 HISTORY — PX: HERNIA REPAIR: SHX51

## 2011-02-20 HISTORY — PX: RECONSTRUCTION BREAST W/ TRAM FLAP: SUR1079

## 2011-03-07 NOTE — Discharge Summary (Signed)
NAME:  Andrea Castro, Andrea Castro                         ACCOUNT NO.:  [id number]   MEDICAL RECORD NO.:  [id number]                   PATIENT TYPE:  INP   LOCATION:  9133                                 FACILITY:  [facility]   PHYSICIAN:  Malachi Pro. Ambrose Mantle, M.D.              DATE OF BIRTH:  03/20/1956   DATE OF ADMISSION:  07/07/2003  DATE OF DISCHARGE:  07/09/2003                                 DISCHARGE SUMMARY   HISTORY OF PRESENT ILLNESS:  A 55 year old white married female para 2-0-2-2  gravida 5. [facility] July 17, 2003 by ultrasound. Admitted for induction of  labor because of elevated blood pressure and history of precipitous labor at  home.   LABORATORY DATA:  Blood group and type O+. Negative antibody. Non-reactive  serology. Rubella immune. Hepatitis B surface antigen negative. HIV  negative. GC and Chlamydia negative. Triple screen Down syndrome risk 1:162.  Amnio showed normal alpha fetoprotein and chromosomes. The 1 hour Glucola  was 112. Group B strep was negative.   Vaginal ultrasound and abdominal ultrasound at 10 weeks and 18 weeks showed  due dates of July 17, 2003 and July 12, 2003.   HOSPITAL COURSE:  Prenatal care was complicated by labile high blood  pressure. Last pregnancy delivered at home after a precipitous labor. On the  office visit, on the day of admission, the patient's blood pressure was  174/102 and 146/96. Cervix was 2 cm, 40%, vertex at a minus 2. The patient  was admitted. She had no headache or epigastric pain.   ALLERGIES:  No known drug allergies.   PAST MEDICAL HISTORY:  In 1983, laminectomy. In 1996, suction D&C. In 2002,  an ACL repair. No illnesses.   FAMILY HISTORY:  Parents with high blood pressure. Maternal grandmother with  diabetes. Maternal grandfather with kidney failure.   SOCIAL HISTORY:  No alcohol, tobacco, and drugs.   OBSTETRIC HISTORY:  In 1996 the patient had a non-viable intrauterine  pregnancy, terminated by suction D&C.  In April of 1997, a 6 pound 6 ounce  female complicated by pre-eclampsia and post partum hemorrhage. In November  of 2000, a 6 pound 5 ounce female, delivered at home. In 1999, she had a  spontaneous abortion.   PHYSICAL EXAMINATION:  VITAL SIGNS: On admission vital signs were normal  except for blood pressure of 142/103.  ABDOMEN: Soft. Fundal height 37 cm. Fetal heart tones reactive.  PELVIC: Cervix 2 cm, 40%.  EXTREMITIES: Deep tendon reflexes 2.   HOSPITAL COURSE CONTINUED:  Pitocin was begun. At 7:20 p.m., the Pitocin was  at 4 milliunits a minute, contractions every 4 minutes, cervix 2 cm, 50%,  vertex, at a minus 2 to minus 3 and artificial rupture of the membranes  produced clear fluid. By 9:45 p.m., the Pitocin was at 7 milliunits a  minute, cervix 4 to 5 cm. The patient reached full dilatation, pushed well  and delivered  spontaneously OA with a second degree midline laceration by  Dr. Ambrose Mantle, a living female infant 8 pounds 1 ounce with Apgar's of 8 at 1 and  9 at 5 minutes. There was 1 loop of nuchal cord. The placenta was intact.  Uterus was normal. Rectal negative. Second degree midline laceration  repaired with 2-0 Vicryl. Blood loss about 400 cc. Postpartum, the patient  did well and was discharged on the second postpartum day. However, the baby  is not ready for discharge and so, the patient is going to room in.  Initial  hemoglobin 13.1. Hematocrit 37.0. White count 8300. Platelet count 152,000.  Followup hemoglobin 12.3. Platelet count 121,000. PIH labs were normal. Uric  acid was 6.2. RPR was nonreactive.   FINAL DIAGNOSES:  1. Intrauterine pregnancy  at 39 weeks, delivered OA.  2. Labile hypertension.   OPERATION:  Spontaneous delivery OA. Second degree midline laceration  repair.   CONDITION ON DISCHARGE:  Improved.   SPECIAL INSTRUCTIONS:  Regular discharge instruction booklet.   DISCHARGE MEDICATIONS:  Percocet 5/325 #16 tablets, 1 every 4 to 6 hours as   needed for pain is given at discharge.   DISPOSITION:  The patient will room in until the baby is ready for  discharge. She is given a copy of our routine instructions.                                                Malachi Pro. Ambrose Mantle, M.D.    TFH/MEDQ  D:  07/09/2003  T:  07/09/2003  Job:  [id number]

## 2011-03-07 NOTE — Op Note (Signed)
NAME:  Andrea Castro, Andrea Castro                         ACCOUNT NO.:  [id number]   MEDICAL RECORD NO.:  [id number]                   PATIENT TYPE:  AMB   LOCATION:  DAY                                  FACILITY:  [facility]   PHYSICIAN:  Malachi Pro. Ambrose Mantle, M.D.              DATE OF BIRTH:  11-24-1955   DATE OF PROCEDURE:  09/13/2003  DATE OF DISCHARGE:                                 OPERATIVE REPORT   PREOPERATIVE DIAGNOSIS:  Voluntary sterilization.   POSTOPERATIVE DIAGNOSIS:  Voluntary sterilization.   OPERATION:  Laparoscopic tubal cauterization.   OPERATOR:  Malachi Pro. Ambrose Mantle, M.D.   ANESTHESIA:  General anesthesia.   The patient was brought to the operating room and placed under satisfactory  general anesthesia, placed in the Allen stirrups in lithotomy position.  The  abdomen, vulva, vagina, and urethra were prepped with Betadine solution.  The bladder was entered with a [place] catheter.  The abdomen was then draped  as a sterile field.  A small incision was made in the inferior portion of  the umbilicus.  A Veress cannula was placed into the abdominal cavity.  Aspiration revealed it was not in a blood vessel, not in bowel or urinary  contents.  Saline was pushed in, none could be recovered.  I then inserted  over 3 L of CO2 with normal filling pressure of 8 mmHg, placed the  laparoscopic trocar, and placed a suprapubic trocar under direct vision.  I  inspected the entire abdomen.  The liver was smooth.  The gallbladder looked  normal.  There was no evidence of inflammation in the abdominal cavity.  The  posterior cul-de-sac was normal except for some pigment just medial to the  left uterosacral ligament, it could have been endometriosis. The anterior  cul-de-sac was normal, as was the uterus. The right tube and ovary and the  left tube and ovary were all normal.  I cauterized each tube in three or  four consecutive locations with the bipolar current at 4.5 until the meter  read 0.   There was good blanching.  At one point with the Kleppinger forceps  sticking to the mesosalpinx, it tore the mesosalpinx just slightly but it  was not a through-and-through tear, so I cauterized this area.  At this  point the procedure was complete.  No complication.  I removed the lower  suprapubic trocar, inspected the entry site.  There was no bleeding.  I then  removed the laparoscope through the umbilical incision and there was no  bleeding in the abdominal wall.  The umbilical incision was closed with  interrupted 3-0 plain catgut.  The suprapubic incision was closed with Steri-  Strips.  The Hulka cannula was removed from the uterus.  It had been placed  before the surgery into the uterus and attached to the anterior cervical  lip.  Blood loss was about 10 mL.  The patient tolerated the  procedure well.  She was returned to recovery in satisfactory condition.                                               Malachi Pro. Ambrose Mantle, M.D.    TFH/MEDQ  D:  09/13/2003  T:  09/13/2003  Job:  [id number]

## 2011-03-12 IMAGING — CT CT ABD-PELV W/ CM
2 of 5 series · 16 of 46 positions shown, 18 images · IV contrast (agent unspecified)
Comparison: PET of same date.  Dictated separately.  Breast MR
12/10/2009.

CT CHEST

CLINICAL DATA: New diagnosis of left-sided breast cancer.

CT CHEST, ABDOMEN AND PELVIS WITH CONTRAST
TECHNIQUE: Contiguous axial images of the chest abdomen and pelvis
were obtained after IV contrast administration.
Contrast: 100  ml [id number]

[Series 2: cap with st · axial · 0.88mm/px · z∈[-634,-74]mm · 13 of 128 slices shown, 15 images]
[im 8/128  soft-tissue]
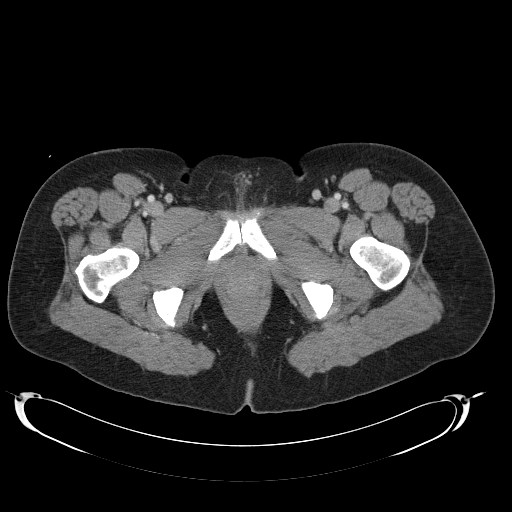
[im 8/128  bone]
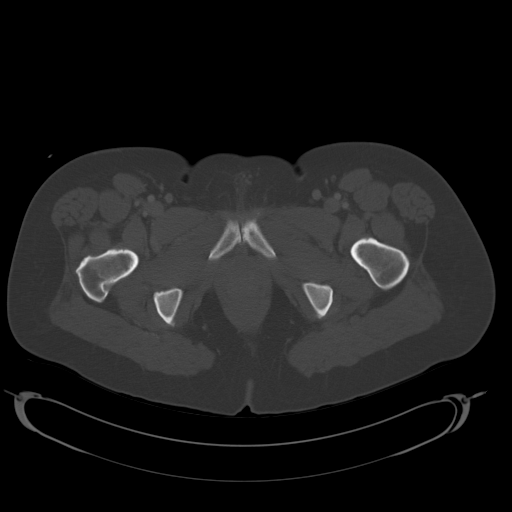
[im 15/128  soft-tissue]
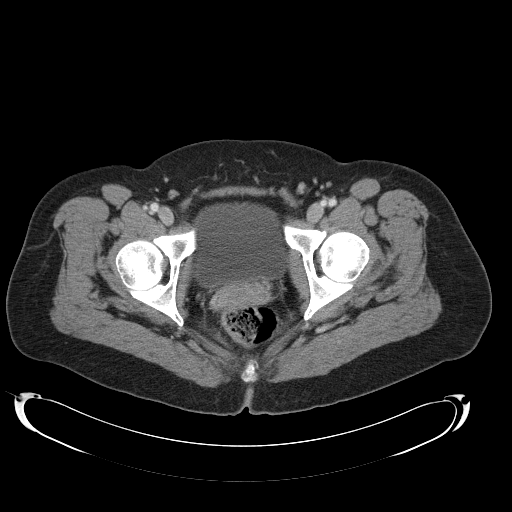
[im 29/128  soft-tissue]
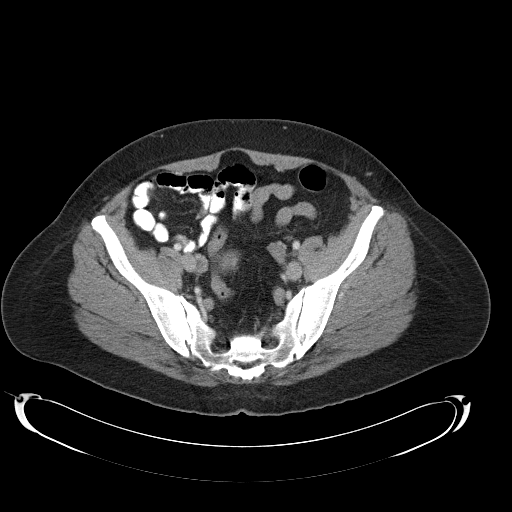
[im 36/128  soft-tissue]
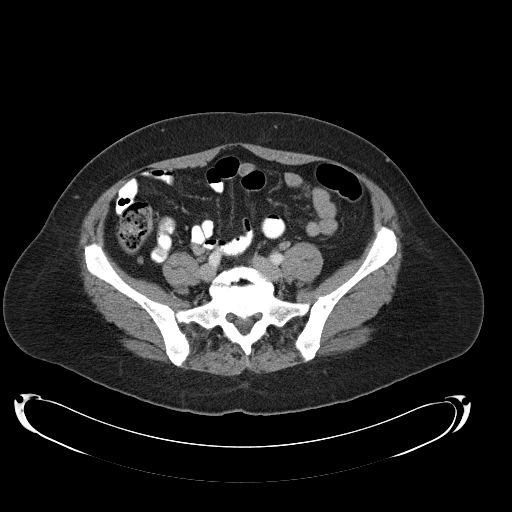
[im 43/128  soft-tissue]
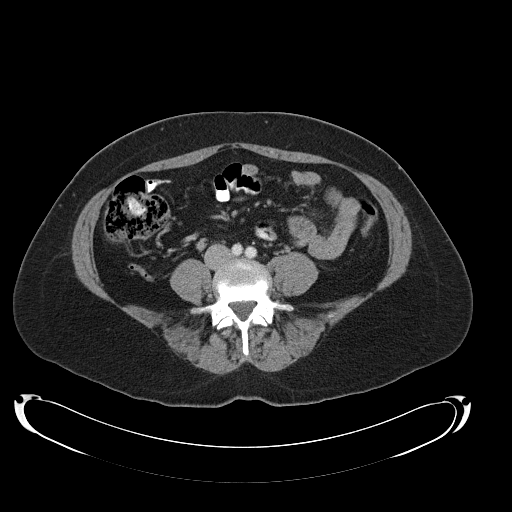
[im 57/128  soft-tissue]
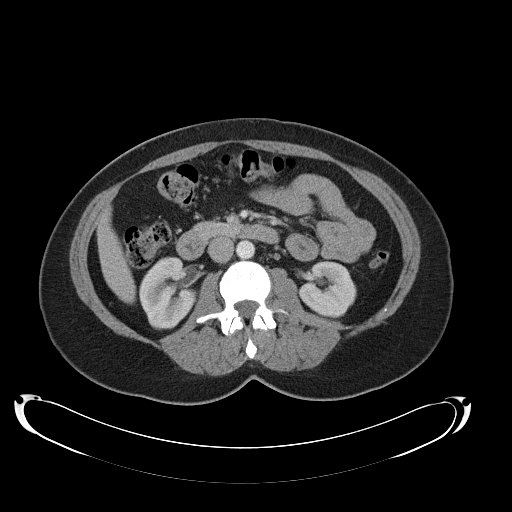
[im 64/128  soft-tissue]
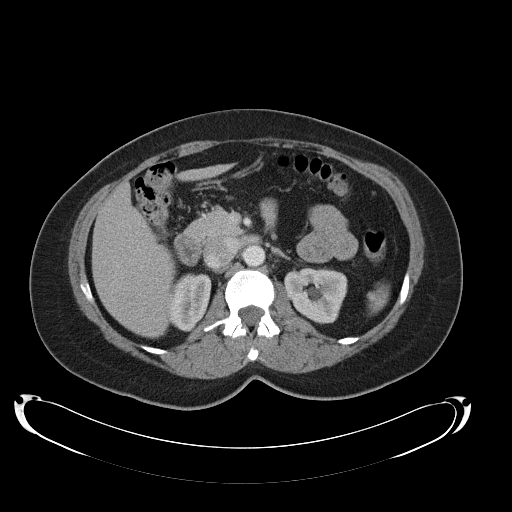
[im 71/128  soft-tissue]
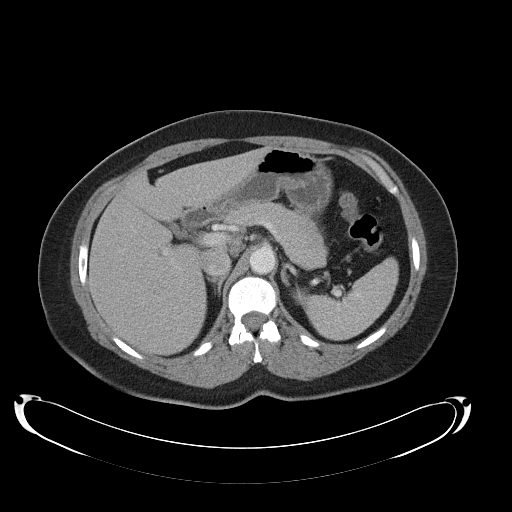
[im 85/128  soft-tissue]
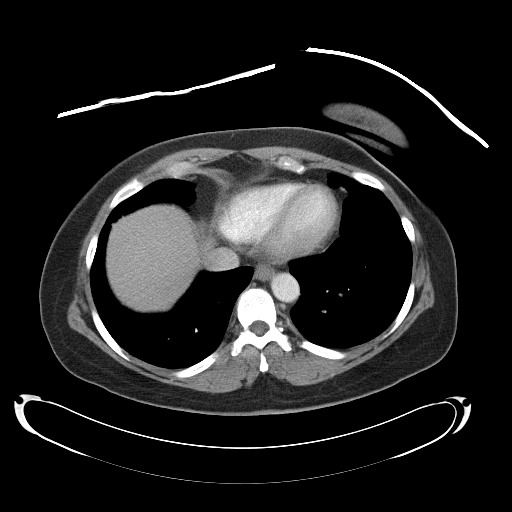
[im 85/128  bone]
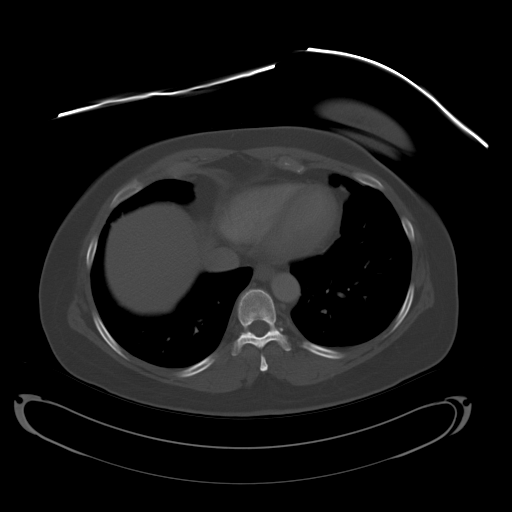
[im 92/128  soft-tissue]
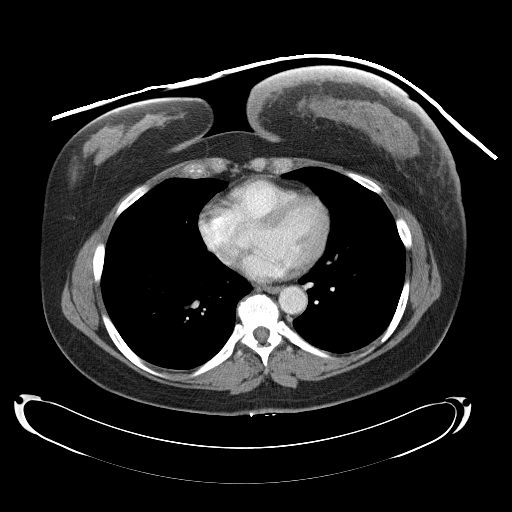
[im 99/128  soft-tissue]
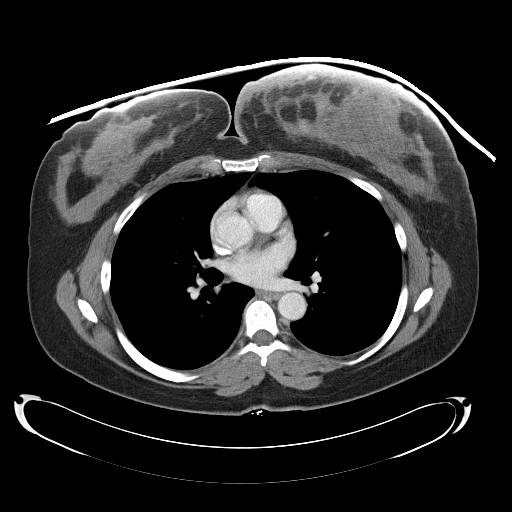
[im 113/128  soft-tissue]
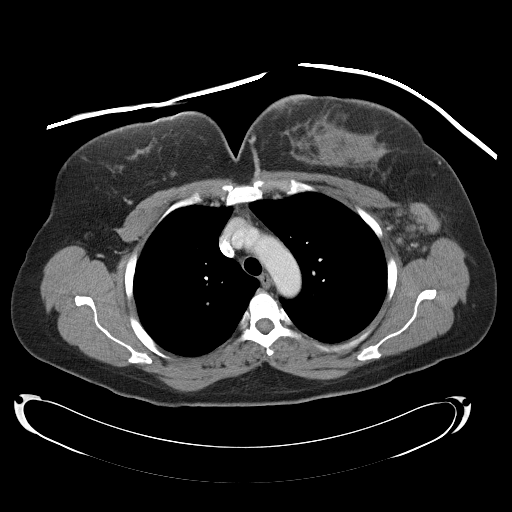
[im 120/128  soft-tissue]
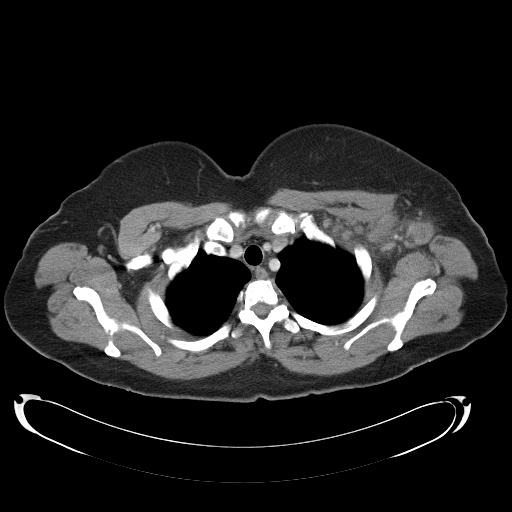

[Series 602: <mpr thick range> · coronal · 1.24mm/px · 3 of 89 slices shown]
[im 30/89  soft-tissue]
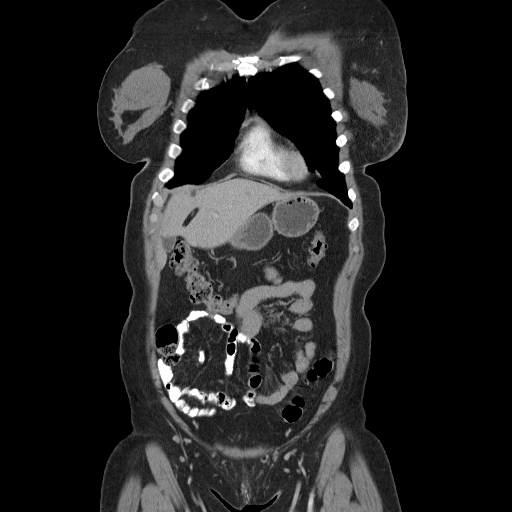
[im 40/89  soft-tissue]
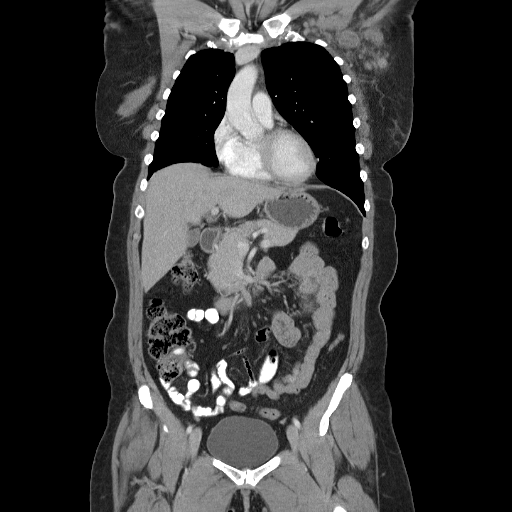
[im 49/89  soft-tissue]
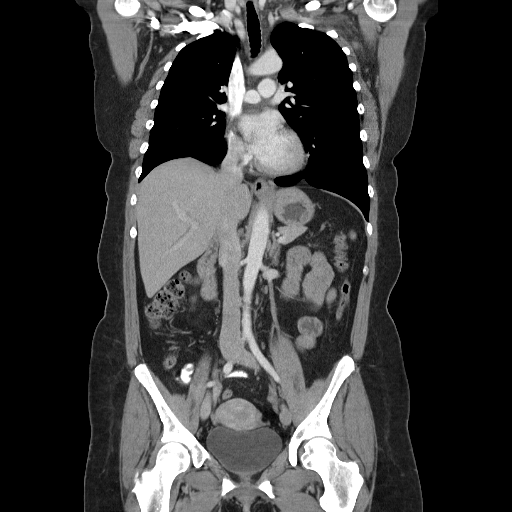

[16 of 46 positions shown; findings below may reference images not displayed]

FINDINGS: Lung windows demonstrate  left apical lung nodule which
measures 4 mm on image 8.
Clear right lung.

Soft tissue windows demonstrate no right axillary adenopathy.  Left
breast primary with overlying skin thickening.  Left axillary and
subpectoral nodal metastasis as described at PET.  Index left
axillary node measures 1.8 x 2.6 cm on image 11.  Left subpectoral
nodes measure 1.0 cm including on image 8.

Normal heart size without pericardial or pleural effusion.  No
middle mediastinal or hilar adenopathy.  No internal mammary
adenopathy.
IMPRESSION: 1.  Left breast primary with left axillary and subpectoral nodal
metastasis.
2.  Tiny left apical lung nodule.  This warrants followup
attention.

CT ABDOMEN AND PELVIS
FINDINGS: 2 hepatic cysts, corresponding to T2 hyperintensity on
recent breast MRI.  No suspicious liver lesion.

Small splenule.  Normal stomach, pancreas, gallbladder, biliary
tract, adrenal glands, right kidney.  Tiny left renal lesion is
likely a cyst. No retroperitoneal or retrocrural adenopathy.

Normal colon, appendix, and terminal ileum.  Normal small bowel
without ascites.

  No pelvic adenopathy.    Normal urinary bladder and uterus.  No
adnexal mass.  No significant free fluid.  Degenerative disc
disease involves the lumbosacral junction.  Degenerative sclerosis
about the sacroiliac joints bilaterally.
IMPRESSION: 1. No acute process or evidence of metastatic disease in the
abdomen or pelvis.
2.  PhePD hyperintense foci within the liver on breast MR
correspond to tiny hepatic cysts.  No suspicious hepatic lesion.

## 2011-05-13 ENCOUNTER — Other Ambulatory Visit: Payer: Self-pay | Admitting: Oncology

## 2011-05-13 ENCOUNTER — Encounter (HOSPITAL_BASED_OUTPATIENT_CLINIC_OR_DEPARTMENT_OTHER): Payer: BC Managed Care – PPO | Admitting: Oncology

## 2011-05-13 DIAGNOSIS — Z5112 Encounter for antineoplastic immunotherapy: Secondary | ICD-10-CM

## 2011-05-13 DIAGNOSIS — C50419 Malignant neoplasm of upper-outer quadrant of unspecified female breast: Secondary | ICD-10-CM

## 2011-05-13 LAB — CBC WITH DIFFERENTIAL/PLATELET
BASO%: 0.2 % (ref 0.0–2.0)
EOS%: 4.8 % (ref 0.0–7.0)
HCT: 39.2 % (ref 34.8–46.6)
LYMPH%: 20.9 % (ref 14.0–49.7)
MCH: 31.8 pg (ref 25.1–34.0)
MCHC: 34 g/dL (ref 31.5–36.0)
MONO#: 0.4 10*3/uL (ref 0.1–0.9)
NEUT%: 66.8 % (ref 38.4–76.8)
RBC: 4.19 10*6/uL (ref 3.70–5.45)
WBC: 5.1 10*3/uL (ref 3.9–10.3)
lymph#: 1.1 10*3/uL (ref 0.9–3.3)

## 2011-05-14 LAB — COMPREHENSIVE METABOLIC PANEL
ALT: 10 U/L (ref 0–35)
AST: 13 U/L (ref 0–37)
CO2: 25 mEq/L (ref 19–32)
Creatinine, Ser: 0.86 mg/dL (ref 0.50–1.10)
Sodium: 140 mEq/L (ref 135–145)
Total Bilirubin: 0.4 mg/dL (ref 0.3–1.2)
Total Protein: 7.2 g/dL (ref 6.0–8.3)

## 2011-06-26 ENCOUNTER — Encounter (HOSPITAL_BASED_OUTPATIENT_CLINIC_OR_DEPARTMENT_OTHER): Payer: BC Managed Care – PPO | Admitting: Oncology

## 2011-06-26 DIAGNOSIS — Z452 Encounter for adjustment and management of vascular access device: Secondary | ICD-10-CM

## 2011-06-26 DIAGNOSIS — C50419 Malignant neoplasm of upper-outer quadrant of unspecified female breast: Secondary | ICD-10-CM

## 2011-07-02 ENCOUNTER — Encounter (INDEPENDENT_AMBULATORY_CARE_PROVIDER_SITE_OTHER): Payer: Self-pay | Admitting: Surgery

## 2011-07-15 ENCOUNTER — Encounter (INDEPENDENT_AMBULATORY_CARE_PROVIDER_SITE_OTHER): Payer: Self-pay | Admitting: Surgery

## 2011-07-15 ENCOUNTER — Encounter (INDEPENDENT_AMBULATORY_CARE_PROVIDER_SITE_OTHER): Payer: Self-pay | Admitting: General Surgery

## 2011-07-15 DIAGNOSIS — C50412 Malignant neoplasm of upper-outer quadrant of left female breast: Secondary | ICD-10-CM

## 2011-07-15 DIAGNOSIS — Z170 Estrogen receptor positive status [ER+]: Secondary | ICD-10-CM | POA: Insufficient documentation

## 2011-07-17 ENCOUNTER — Encounter (INDEPENDENT_AMBULATORY_CARE_PROVIDER_SITE_OTHER): Payer: Self-pay | Admitting: Surgery

## 2011-07-17 ENCOUNTER — Ambulatory Visit (INDEPENDENT_AMBULATORY_CARE_PROVIDER_SITE_OTHER): Payer: BC Managed Care – PPO | Admitting: Surgery

## 2011-07-17 VITALS — BP 124/88 | HR 68 | Temp 97.9°F | Resp 16 | Ht 64.25 in | Wt 180.4 lb

## 2011-07-17 DIAGNOSIS — Z853 Personal history of malignant neoplasm of breast: Secondary | ICD-10-CM

## 2011-07-17 NOTE — Progress Notes (Signed)
NAME: [name]       DOB: February 05, 1956           DATE: 07/17/2011       MRN: [id number]   Andrea Castro is a 55 y.o.Marland Kitchenfemale who presents for routine followup of her Left breast cancer diagnosed in 2011 and treated with neoadjuvantchemo and left mastectomy. She then had Right prophylactic mastectomy and bilateral reconstructions. She has no problems or concerns on either side.  PFSH: She has had no significant changes since the last visit here.  ROS: There have been no significant changes since the last visit here  EXAM: General: The patient is alert, oriented, generally healty appearing, NAD. Mood and affect are normal.  Breasts:  She is status post bilateral mastectomies with bilateral flap reconstruction. There is no evidence of recurrent tumor and I think she's had an excellent cosmetic result so far. There is clearly some additional work he'll need to be done but I think this is more just touchup-type work  Lymphatics: She has no axillary or supraclavicular adenopathy on either side.  Extremities: Full ROM of the surgical side with no lymphedema noted.  Data Reviewed: Notes from the oncologist  Impression: Doing well, with no evidence of recurrent cancer or new cancer  Plan: RTC PRN

## 2011-07-17 NOTE — Patient Instructions (Signed)
You should consider having mammogram and or an MRI of your breast since you have had flap reconstructions. This should be done about a year after your reconstructions.  When your oncologist says it's okay to take your port out you can call and we can schedule to have this done as an outpatient under local anesthesia. Alternatively, you can have your plastic surgeon remove it when he is doing some of your "touchup" work.  Come to see me if you're having any problems or concerns.

## 2011-07-18 DIAGNOSIS — C50919 Malignant neoplasm of unspecified site of unspecified female breast: Secondary | ICD-10-CM | POA: Insufficient documentation

## 2011-07-28 DIAGNOSIS — Z9889 Other specified postprocedural states: Secondary | ICD-10-CM

## 2011-07-28 HISTORY — DX: Other specified postprocedural states: Z98.890

## 2011-08-08 ENCOUNTER — Encounter (HOSPITAL_BASED_OUTPATIENT_CLINIC_OR_DEPARTMENT_OTHER): Payer: BC Managed Care – PPO | Admitting: Oncology

## 2011-08-08 DIAGNOSIS — C50419 Malignant neoplasm of upper-outer quadrant of unspecified female breast: Secondary | ICD-10-CM

## 2011-08-08 DIAGNOSIS — Z452 Encounter for adjustment and management of vascular access device: Secondary | ICD-10-CM

## 2011-08-21 HISTORY — PX: OTHER SURGICAL HISTORY: SHX169

## 2011-09-17 ENCOUNTER — Telehealth: Payer: Self-pay | Admitting: *Deleted

## 2011-09-17 NOTE — Telephone Encounter (Signed)
patient requested to change her flush appointment to 09-22-2011 patient confirmed over the phone

## 2011-09-22 ENCOUNTER — Ambulatory Visit (HOSPITAL_BASED_OUTPATIENT_CLINIC_OR_DEPARTMENT_OTHER): Payer: BC Managed Care – PPO

## 2011-09-22 DIAGNOSIS — C50919 Malignant neoplasm of unspecified site of unspecified female breast: Secondary | ICD-10-CM

## 2011-09-22 DIAGNOSIS — Z452 Encounter for adjustment and management of vascular access device: Secondary | ICD-10-CM

## 2011-09-22 DIAGNOSIS — C50419 Malignant neoplasm of upper-outer quadrant of unspecified female breast: Secondary | ICD-10-CM

## 2011-09-22 MED ORDER — HEPARIN SOD (PORK) LOCK FLUSH 100 UNIT/ML IV SOLN
500.0000 [IU] | Freq: Once | INTRAVENOUS | Status: AC
  Administered 2011-09-22: 500 [IU] via INTRAVENOUS
  Filled 2011-09-22: qty 5

## 2011-09-22 MED ORDER — SODIUM CHLORIDE 0.9 % IJ SOLN
10.0000 mL | INTRAMUSCULAR | Status: DC | PRN
  Administered 2011-09-22: 10 mL via INTRAVENOUS
  Filled 2011-09-22: qty 10

## 2011-09-22 NOTE — Patient Instructions (Signed)
Call MD for problems 

## 2011-09-25 ENCOUNTER — Telehealth: Payer: Self-pay | Admitting: Oncology

## 2011-09-25 NOTE — Telephone Encounter (Signed)
called pts home lmovm with appts for jan2013. asked pt to rtn call to confirm appts

## 2011-09-26 ENCOUNTER — Telehealth: Payer: Self-pay | Admitting: Oncology

## 2011-09-26 NOTE — Telephone Encounter (Signed)
pt rtn call and confirmed appts for jan2013 °

## 2011-11-20 ENCOUNTER — Ambulatory Visit (HOSPITAL_BASED_OUTPATIENT_CLINIC_OR_DEPARTMENT_OTHER): Payer: BC Managed Care – PPO | Admitting: Oncology

## 2011-11-20 ENCOUNTER — Other Ambulatory Visit (HOSPITAL_BASED_OUTPATIENT_CLINIC_OR_DEPARTMENT_OTHER): Payer: BC Managed Care – PPO | Admitting: Lab

## 2011-11-20 ENCOUNTER — Other Ambulatory Visit: Payer: Self-pay | Admitting: Physician Assistant

## 2011-11-20 VITALS — BP 144/91 | HR 72 | Temp 98.1°F | Ht 64.25 in | Wt 187.4 lb

## 2011-11-20 DIAGNOSIS — Z923 Personal history of irradiation: Secondary | ICD-10-CM

## 2011-11-20 DIAGNOSIS — C50419 Malignant neoplasm of upper-outer quadrant of unspecified female breast: Secondary | ICD-10-CM

## 2011-11-20 DIAGNOSIS — Z853 Personal history of malignant neoplasm of breast: Secondary | ICD-10-CM

## 2011-11-20 DIAGNOSIS — Z978 Presence of other specified devices: Secondary | ICD-10-CM

## 2011-11-20 DIAGNOSIS — Z9221 Personal history of antineoplastic chemotherapy: Secondary | ICD-10-CM

## 2011-11-20 LAB — CBC WITH DIFFERENTIAL/PLATELET
Basophils Absolute: 0 10*3/uL (ref 0.0–0.1)
EOS%: 2.5 % (ref 0.0–7.0)
HGB: 14 g/dL (ref 11.6–15.9)
LYMPH%: 22.3 % (ref 14.0–49.7)
MCH: 31.6 pg (ref 25.1–34.0)
MCV: 92.8 fL (ref 79.5–101.0)
MONO%: 6 % (ref 0.0–14.0)
NEUT%: 68.7 % (ref 38.4–76.8)
Platelets: 197 10*3/uL (ref 145–400)
RDW: 13.2 % (ref 11.2–14.5)

## 2011-11-20 LAB — COMPREHENSIVE METABOLIC PANEL
Albumin: 4.2 g/dL (ref 3.5–5.2)
BUN: 20 mg/dL (ref 6–23)
Calcium: 9.6 mg/dL (ref 8.4–10.5)
Chloride: 105 mEq/L (ref 96–112)
Creatinine, Ser: 1 mg/dL (ref 0.50–1.10)
Glucose, Bld: 96 mg/dL (ref 70–99)
Potassium: 4.4 mEq/L (ref 3.5–5.3)

## 2011-11-20 NOTE — Patient Instructions (Signed)
1. Dr Jamey Ripa to remove your port 2. Take additional vitamin D , vitamin d3  2000 units/day 3. Bone density before you return in  6months 4. You will arrange to have a colonoscopy 5. We will arrange for a mammogram

## 2011-11-20 NOTE — Progress Notes (Signed)
Hematology and Oncology Follow Up Visit  Andrea Castro [id number] 24-Dec-1955 57 y.o. 11/20/2011 9:19 AM PCP  Dr Tamela Oddi             Dr Maud Deed  1. Principle Diagnosis: History of locally-advanced inflammatory breast cancer, estrogen receptor negative, progesterone receptor negative, HER-2 positive, status post 6 cycles of TCH, status post mastectomy and node dissection with residual cancer in 2 out of 26 involved lymph nodes. 2. Status post radiation therapy to chest wall completed 08/19/2010 and status post completion of Herceptin therapy March 2012. 3.      History of bilateral transverse rectus abdominis myocutaneous flap reconstructions May 2012  Interim History:  There have been no intercurrent illness, hospitalizations or medication changes.she continues to work for [employer] and has no complaints. Her port is still in place and will be removed in march.  Medications: I have reviewed the patient's current medications.  Allergies: No Known Allergies  Past Medical History, Surgical history, Social history, and Family History were reviewed and updated.  Review of Systems: Constitutional:  Negative for fever, chills, night sweats, anorexia, weight loss, pain. Cardiovascular: no chest pain or dyspnea on exertion Respiratory: no cough, shortness of breath, or wheezing Neurological: negative Dermatological: negative ENT: negative Skin Gastrointestinal: no abdominal pain, change in bowel habits, or black or bloody stools Genito-Urinary: no dysuria, trouble voiding, or hematuria Hematological and Lymphatic: negative Breast: positive for - lt chest wall discomfort Musculoskeletal: negative Remaining ROS negative.  Physical Exam: Blood pressure 144/91, pulse 72, temperature 98.1 F (36.7 C), height 5' 4.25" (1.632 m), weight 187 lb 6.4 oz (85.004 kg). ECOG: 0 General appearance: alert, cooperative and appears stated age Head: Normocephalic, without obvious abnormality,  atraumatic Neck: no adenopathy, no carotid bruit, no JVD, supple, symmetrical, trachea midline and thyroid not enlarged, symmetric, no tenderness/mass/nodules Lymph nodes: Cervical, supraclavicular, and axillary nodes normal. Cardiac : regular rate and rhythm, no murmurs or gallops Pulmonary:clear to auscultation bilaterally and normal percussion bilaterally Breasts: inspection negative, no nipple discharge or bleeding, no masses or nodularity palpable, s/p bilateral tram flaps, with no evidence of recurrence..  Abdomen:soft, non-tender; bowel sounds normal; no masses,  no organomegaly Extremities negative Neuro: alert, oriented, normal speech, no focal findings or movement disorder noted  Lab Results: Lab Results  Component Value Date   WBC 4.8 11/20/2011   HGB 14.0 11/20/2011   HCT 41.0 11/20/2011   MCV 92.8 11/20/2011   PLT 197 11/20/2011     Chemistry      Component Value Date/Time   NA 140 05/13/2011 1039   NA 140 05/13/2011 1039   NA 140 05/13/2011 1039   K 4.2 05/13/2011 1039   K 4.2 05/13/2011 1039   K 4.2 05/13/2011 1039   CL 106 05/13/2011 1039   CL 106 05/13/2011 1039   CL 106 05/13/2011 1039   CO2 25 05/13/2011 1039   CO2 25 05/13/2011 1039   CO2 25 05/13/2011 1039   BUN 27* 05/13/2011 1039   BUN 27* 05/13/2011 1039   BUN 27* 05/13/2011 1039   CREATININE 0.86 05/13/2011 1039   CREATININE 0.86 05/13/2011 1039   CREATININE 0.86 05/13/2011 1039      Component Value Date/Time   CALCIUM 9.4 05/13/2011 1039   CALCIUM 9.4 05/13/2011 1039   CALCIUM 9.4 05/13/2011 1039   ALKPHOS 75 05/13/2011 1039   ALKPHOS 75 05/13/2011 1039   ALKPHOS 75 05/13/2011 1039   AST 13 05/13/2011 1039   AST 13 05/13/2011 1039  AST 13 05/13/2011 1039   ALT 10 05/13/2011 1039   ALT 10 05/13/2011 1039   ALT 10 05/13/2011 1039   BILITOT 0.4 05/13/2011 1039   BILITOT 0.4 05/13/2011 1039   BILITOT 0.4 05/13/2011 1039      .pathology. Radiological Studies: chest X-ray n/a Mammogram n/a Bone  density n/a  Impression and Plan: Andrea Castro is doing well, we will ask dr Jamey Ripa to remove her port.I have asked that she take additional vitamin d and will check a bone density test before she returns. She needs a colonoscopy as well.  More than 50% of the visit was spent in patient-related counselling   Pierce Crane, MD 1/31/20139:19 AM

## 2011-11-21 ENCOUNTER — Other Ambulatory Visit (INDEPENDENT_AMBULATORY_CARE_PROVIDER_SITE_OTHER): Payer: Self-pay | Admitting: Surgery

## 2012-01-09 ENCOUNTER — Encounter (HOSPITAL_BASED_OUTPATIENT_CLINIC_OR_DEPARTMENT_OTHER)
Admission: RE | Disposition: A | Discharge status: Home / Self Care | Payer: Self-pay | Source: Ambulatory Visit | Attending: Surgery

## 2012-01-09 ENCOUNTER — Ambulatory Visit (HOSPITAL_BASED_OUTPATIENT_CLINIC_OR_DEPARTMENT_OTHER)
Admission: RE | Admit: 2012-01-09 | Discharge: 2012-01-09 | Disposition: A | Discharge status: Home / Self Care | Payer: BC Managed Care – PPO | Source: Ambulatory Visit | Attending: Surgery | Admitting: Surgery

## 2012-01-09 DIAGNOSIS — Z853 Personal history of malignant neoplasm of breast: Secondary | ICD-10-CM

## 2012-01-09 DIAGNOSIS — Z452 Encounter for adjustment and management of vascular access device: Secondary | ICD-10-CM | POA: Insufficient documentation

## 2012-01-09 HISTORY — PX: PORT-A-CATH REMOVAL: SHX5289

## 2012-01-09 SURGERY — MINOR REMOVAL PORT-A-CATH
Anesthesia: LOCAL | Site: Chest | Wound class: Clean

## 2012-01-09 MED ORDER — LIDOCAINE-EPINEPHRINE (PF) 1 %-1:200000 IJ SOLN
INTRAMUSCULAR | Status: DC | PRN
  Administered 2012-01-09: 8.7 mL

## 2012-01-09 MED ORDER — SODIUM BICARBONATE 4 % IV SOLN
INTRAVENOUS | Status: DC | PRN
  Administered 2012-01-09: 1.3 mL via INTRAVENOUS

## 2012-01-09 SURGICAL SUPPLY — 28 items
BENZOIN TINCTURE PRP APPL 2/3 (GAUZE/BANDAGES/DRESSINGS) IMPLANT
BLADE SURG 15 STRL LF DISP TIS (BLADE) ×1 IMPLANT
BLADE SURG 15 STRL SS (BLADE) ×1
CHLORAPREP W/TINT 26ML (MISCELLANEOUS) ×2 IMPLANT
CLOTH BEACON ORANGE TIMEOUT ST (SAFETY) ×2 IMPLANT
DERMABOND ADVANCED (GAUZE/BANDAGES/DRESSINGS) ×1
DERMABOND ADVANCED .7 DNX12 (GAUZE/BANDAGES/DRESSINGS) ×1 IMPLANT
DRSG TEGADERM 4X4.75 (GAUZE/BANDAGES/DRESSINGS) IMPLANT
ELECT REM PT RETURN 9FT ADLT (ELECTROSURGICAL)
ELECTRODE REM PT RTRN 9FT ADLT (ELECTROSURGICAL) IMPLANT
GAUZE SPONGE 4X4 12PLY STRL LF (GAUZE/BANDAGES/DRESSINGS) IMPLANT
GAUZE SPONGE 4X4 16PLY XRAY LF (GAUZE/BANDAGES/DRESSINGS) IMPLANT
GLOVE EUDERMIC 7 POWDERFREE (GLOVE) ×2 IMPLANT
GLOVE SKINSENSE NS SZ7.0 (GLOVE) ×1
GLOVE SKINSENSE STRL SZ7.0 (GLOVE) ×1 IMPLANT
MARKER SKIN DUAL TIP RULER LAB (MISCELLANEOUS) ×2 IMPLANT
NDL SAFETY ECLIPSE 18X1.5 (NEEDLE) IMPLANT
NEEDLE HYPO 18GX1.5 SHARP (NEEDLE)
NEEDLE HYPO 25X1 1.5 SAFETY (NEEDLE) ×2 IMPLANT
PENCIL BUTTON HOLSTER BLD 10FT (ELECTRODE) IMPLANT
STRIP CLOSURE SKIN 1/2X4 (GAUZE/BANDAGES/DRESSINGS) IMPLANT
SUT MNCRL AB 4-0 PS2 18 (SUTURE) ×2 IMPLANT
SUT VIC AB 3-0 FS2 27 (SUTURE) IMPLANT
SUT VIC AB 4-0 BRD 54 (SUTURE) IMPLANT
SUT VIC AB 4-0 P-3 18XBRD (SUTURE) IMPLANT
SUT VIC AB 4-0 P3 18 (SUTURE)
SUT VIC AB 4-0 SH 18 (SUTURE) ×2 IMPLANT
SYR CONTROL 10ML LL (SYRINGE) ×2 IMPLANT

## 2012-01-09 NOTE — Op Note (Signed)
KYREN VAUX May 07, 1956 [id number] 12/03/2011  Preoperative diagnosis: Un-Needed PAC  Postoperative diagnosis: Same  Procedure: Portacath Removal  Surgeon: Currie Paris, MD, FACS  Anesthesia:local   Clinical History and Indications: The patient has finished her chemotherapy and no longer needs a port. She wishes to have it removed.  Procedure: The patient was seen in the preoperative area and we confirmed the plans for the procedure as noted above. The Port-A-Cath site was identified and marked. The patient had no further questions.  The patient was then taken into the procedure room. The timeout was done. The area over the Port-A-Cath was anesthetized with 1% Xylocaine with epinephrine. I waited about 10 minutes and then the area was prepped and draped.  The old scar was opened. The capsule around the port opened and the port identified. The holding sutures were cut. The catheter was backed partially out of its tract. A figure 8 3-0 Vicryl suture was placed, the tubing removed, and the suture tied down to prevent backbleeding.  The port was then removed from its pocket. I made sure everything was dry. The incision was closed with 3-0 Vicryl, 4-0 Monocryl subcuticular, and Dermabond.  The patient tolerated the procedure well. There were no complications.  Currie Paris, MD, FACS 01/09/2012 3:42 PM

## 2012-01-09 NOTE — Discharge Instructions (Signed)
You may resume all normal activities. You may shower tomorrow. The sutures all dissolve and are under the skin. Call the office for any problems or concerns

## 2012-01-09 NOTE — H&P (Signed)
  Patient has completed therapy and comes to the minor procedure room for [facility] removal under local. \Patient has all questions answered and wishes to proceed.

## 2012-01-12 ENCOUNTER — Encounter (HOSPITAL_BASED_OUTPATIENT_CLINIC_OR_DEPARTMENT_OTHER): Payer: Self-pay | Admitting: Surgery

## 2012-02-03 ENCOUNTER — Encounter (INDEPENDENT_AMBULATORY_CARE_PROVIDER_SITE_OTHER): Payer: BC Managed Care – PPO | Admitting: Surgery

## 2012-02-11 ENCOUNTER — Encounter (INDEPENDENT_AMBULATORY_CARE_PROVIDER_SITE_OTHER): Payer: Self-pay | Admitting: Surgery

## 2012-05-21 ENCOUNTER — Ambulatory Visit: Payer: BC Managed Care – PPO | Admitting: Oncology

## 2012-05-21 ENCOUNTER — Other Ambulatory Visit: Payer: BC Managed Care – PPO | Admitting: Lab

## 2012-06-22 ENCOUNTER — Ambulatory Visit
Admission: RE | Admit: 2012-06-22 | Discharge: 2012-06-22 | Disposition: A | Discharge status: Home / Self Care | Payer: BC Managed Care – PPO | Source: Ambulatory Visit | Attending: Oncology | Admitting: Oncology

## 2012-06-22 DIAGNOSIS — Z853 Personal history of malignant neoplasm of breast: Secondary | ICD-10-CM

## 2012-06-25 ENCOUNTER — Encounter: Payer: Self-pay | Admitting: Gastroenterology

## 2012-07-01 ENCOUNTER — Ambulatory Visit: Payer: BC Managed Care – PPO | Admitting: Oncology

## 2012-07-01 ENCOUNTER — Other Ambulatory Visit: Payer: BC Managed Care – PPO | Admitting: Lab

## 2012-08-04 ENCOUNTER — Ambulatory Visit: Payer: BC Managed Care – PPO | Admitting: Oncology

## 2012-08-05 ENCOUNTER — Telehealth: Payer: Self-pay | Admitting: Oncology

## 2012-08-05 ENCOUNTER — Other Ambulatory Visit (HOSPITAL_BASED_OUTPATIENT_CLINIC_OR_DEPARTMENT_OTHER): Payer: BC Managed Care – PPO | Admitting: Lab

## 2012-08-05 ENCOUNTER — Ambulatory Visit (HOSPITAL_BASED_OUTPATIENT_CLINIC_OR_DEPARTMENT_OTHER): Payer: BC Managed Care – PPO | Admitting: Oncology

## 2012-08-05 VITALS — BP 143/92 | HR 77 | Temp 98.7°F | Resp 20 | Ht 64.0 in | Wt 196.6 lb

## 2012-08-05 DIAGNOSIS — Z170 Estrogen receptor positive status [ER+]: Secondary | ICD-10-CM

## 2012-08-05 DIAGNOSIS — Z853 Personal history of malignant neoplasm of breast: Secondary | ICD-10-CM

## 2012-08-05 DIAGNOSIS — C50419 Malignant neoplasm of upper-outer quadrant of unspecified female breast: Secondary | ICD-10-CM

## 2012-08-05 LAB — CBC WITH DIFFERENTIAL/PLATELET
BASO%: 0.6 % (ref 0.0–2.0)
EOS%: 2.6 % (ref 0.0–7.0)
HCT: 40.7 % (ref 34.8–46.6)
LYMPH%: 27.7 % (ref 14.0–49.7)
MCH: 31.5 pg (ref 25.1–34.0)
MCHC: 33.7 g/dL (ref 31.5–36.0)
NEUT%: 62.7 % (ref 38.4–76.8)
Platelets: 198 10*3/uL (ref 145–400)
RBC: 4.36 10*6/uL (ref 3.70–5.45)

## 2012-08-05 LAB — COMPREHENSIVE METABOLIC PANEL (CC13)
Alkaline Phosphatase: 100 U/L (ref 40–150)
BUN: 20 mg/dL (ref 7.0–26.0)
Creatinine: 0.8 mg/dL (ref 0.6–1.1)
Glucose: 91 mg/dl (ref 70–99)
Total Bilirubin: 0.4 mg/dL (ref 0.20–1.20)

## 2012-08-05 NOTE — Telephone Encounter (Signed)
gve the pt her April 2014 appt calendar °

## 2012-08-05 NOTE — Progress Notes (Signed)
Hematology and Oncology Follow Up Visit  Andrea Castro [id number] 02-13-1956 56 y.o. 08/05/2012 2:52 PM PCP  Dr Tamela Oddi             Dr Maud Deed  1. Principle Diagnosis: History of locally-advanced inflammatory breast cancer, estrogen receptor negative, progesterone receptor negative, HER-2 positive, status post 6 cycles of TCH, status post mastectomy and node dissection with residual cancer in 2 out of 26 involved lymph nodes. 2. Status post radiation therapy to chest wall completed 08/19/2010 and status post completion of Herceptin therapy March 2012. 3.      History of bilateral transverse rectus abdominis myocutaneous flap reconstructions May 2012  Interim History:  There have been no intercurrent illness, hospitalizations or medication changes.she continues to work for [employer] and has no complaints.   Medications: I have reviewed the patient's current medications.  Allergies: No Known Allergies  Past Medical History, Surgical history, Social history, and Family History were reviewed and updated.  Review of Systems: Constitutional:  Negative for fever, chills, night sweats, anorexia, weight loss, pain. Cardiovascular: no chest pain or dyspnea on exertion Respiratory: no cough, shortness of breath, or wheezing Neurological: negative Dermatological: negative ENT: negative Skin Gastrointestinal: no abdominal pain, change in bowel habits, or black or bloody stools Genito-Urinary: no dysuria, trouble voiding, or hematuria Hematological and Lymphatic: negative Breast: positive for - lt chest wall discomfort Musculoskeletal: negative Remaining ROS negative.  Physical Exam: Blood pressure 143/92, pulse 77, temperature 98.7 F (37.1 C), temperature source Oral, resp. rate 20, height 5\' 4"  (1.626 m), weight 196 lb 9.6 oz (89.177 kg). ECOG: 0 General appearance: alert, cooperative and appears stated age Head: Normocephalic, without obvious abnormality, atraumatic Neck:  no adenopathy, no carotid bruit, no JVD, supple, symmetrical, trachea midline and thyroid not enlarged, symmetric, no tenderness/mass/nodules Lymph nodes: Cervical, supraclavicular, and axillary nodes normal. Cardiac : regular rate and rhythm, no murmurs or gallops Pulmonary:clear to auscultation bilaterally and normal percussion bilaterally Breasts: inspection negative, no nipple discharge or bleeding, no masses or nodularity palpable, s/p bilateral tram flaps, with no evidence of recurrence..  Abdomen:soft, non-tender; bowel sounds normal; no masses,  no organomegaly Extremities negative Neuro: alert, oriented, normal speech, no focal findings or movement disorder noted  Lab Results: Lab Results  Component Value Date   WBC 5.8 08/05/2012   HGB 13.7 08/05/2012   HCT 40.7 08/05/2012   MCV 93.5 08/05/2012   PLT 198 08/05/2012     Chemistry      Component Value Date/Time   NA 141 11/20/2011 0836   K 4.4 11/20/2011 0836   CL 105 11/20/2011 0836   CO2 27 11/20/2011 0836   BUN 20 11/20/2011 0836   CREATININE 1.00 11/20/2011 0836      Component Value Date/Time   CALCIUM 9.6 11/20/2011 0836   ALKPHOS 88 11/20/2011 0836   AST 17 11/20/2011 0836   ALT 17 11/20/2011 0836   BILITOT 0.5 11/20/2011 0836      .pathology. Radiological Studies: chest X-ray n/a Mammogram n/a Bone density n/a  Impression and Plan: Ms Kindel is doing well, she had a recent bone density test which was normal. I will see her in 6 months.  More than 50% of the visit was spent in patient-related counselling   Pierce Crane, MD 10/17/20132:52 PM

## 2012-08-06 LAB — VITAMIN D 25 HYDROXY (VIT D DEFICIENCY, FRACTURES): Vit D, 25-Hydroxy: 32 ng/mL (ref 30–89)

## 2012-08-13 ENCOUNTER — Encounter: Payer: BC Managed Care – PPO | Admitting: Gastroenterology

## 2012-08-20 ENCOUNTER — Encounter: Payer: BC Managed Care – PPO | Admitting: Gastroenterology

## 2012-09-10 ENCOUNTER — Encounter: Payer: Self-pay | Admitting: Gastroenterology

## 2012-09-10 ENCOUNTER — Ambulatory Visit (AMBULATORY_SURGERY_CENTER): Payer: BC Managed Care – PPO | Admitting: *Deleted

## 2012-09-10 VITALS — Ht 65.1 in | Wt 195.0 lb

## 2012-09-10 DIAGNOSIS — Z1211 Encounter for screening for malignant neoplasm of colon: Secondary | ICD-10-CM

## 2012-09-10 MED ORDER — MOVIPREP 100 G PO SOLR: ORAL | Status: DC

## 2012-09-13 ENCOUNTER — Encounter: Payer: BC Managed Care – PPO | Admitting: Gastroenterology

## 2012-09-20 ENCOUNTER — Encounter: Payer: BC Managed Care – PPO | Admitting: Gastroenterology

## 2012-09-24 ENCOUNTER — Ambulatory Visit (AMBULATORY_SURGERY_CENTER): Payer: BC Managed Care – PPO | Admitting: Gastroenterology

## 2012-09-24 ENCOUNTER — Encounter: Payer: Self-pay | Admitting: Gastroenterology

## 2012-09-24 VITALS — BP 134/76 | HR 69 | Temp 97.8°F | Resp 17 | Ht 65.0 in | Wt 195.0 lb

## 2012-09-24 DIAGNOSIS — K5730 Diverticulosis of large intestine without perforation or abscess without bleeding: Secondary | ICD-10-CM

## 2012-09-24 DIAGNOSIS — Z1211 Encounter for screening for malignant neoplasm of colon: Secondary | ICD-10-CM

## 2012-09-24 MED ORDER — SODIUM CHLORIDE 0.9 % IV SOLN: 500.0000 mL | INTRAVENOUS | Status: DC

## 2012-09-24 NOTE — Progress Notes (Signed)
Patient did not experience any of the following events: a burn prior to discharge; a fall within the facility; wrong site/side/patient/procedure/implant event; or a hospital transfer or hospital admission upon discharge from the facility. (G8907) Patient did not have preoperative order for IV antibiotic SSI prophylaxis. (G8918)  

## 2012-09-24 NOTE — Progress Notes (Signed)
1430 a/ox3 pleased report to [employer]

## 2012-09-24 NOTE — Op Note (Signed)
[facility] [address] [place], [address]   COLONOSCOPY PROCEDURE REPORT  PATIENT: Andrea Castro, Andrea Castro  MR#: [id number] BIRTHDATE: 11-24-1955 , 56  yrs. old GENDER: Female ENDOSCOPIST: Louis Meckel, MD REFERRED XB:[id number] Ambrose Mantle, M.D. PROCEDURE DATE:  09/24/2012 PROCEDURE:   Colonoscopy, diagnostic ASA CLASS:   Class II INDICATIONS:average risk screening. MEDICATIONS: MAC sedation, administered by CRNA and Propofol (Diprivan) 160 mg IV  DESCRIPTION OF PROCEDURE:   After the risks benefits and alternatives of the procedure were thoroughly explained, informed consent was obtained.  A digital rectal exam revealed no abnormalities of the rectum.   The LB CF-[id number]  endoscope was introduced through the anus and advanced to the cecum, which was identified by both the appendix and ileocecal valve. No adverse events experienced.   The quality of the prep was Suprep excellent The instrument was then slowly withdrawn as the colon was fully examined.      COLON FINDINGS: Moderate diverticulosis was noted in the sigmoid colon.   The colon mucosa was otherwise normal.  Retroflexed views revealed no abnormalities. The time to cecum=2 minutes 07 seconds. Withdrawal time=7 minutes 02 seconds.  The scope was withdrawn and the procedure completed. COMPLICATIONS: There were no complications.  ENDOSCOPIC IMPRESSION: 1.   Moderate diverticulosis was noted in the sigmoid colon 2.   The colon mucosa was otherwise normal  RECOMMENDATIONS: Continue current colorectal screening recommendations for "routine risk" patients with a repeat colonoscopy in 10 years.   eSigned:  Louis Meckel, MD 09/24/2012 2:27 PM   cc:

## 2012-09-24 NOTE — Patient Instructions (Addendum)
YOU HAD AN ENDOSCOPIC PROCEDURE TODAY AT THE [facility]: Refer to the procedure report that was given to you for any specific questions about what was found during the examination.  If the procedure report does not answer your questions, please call your gastroenterologist to clarify.  If you requested that your care partner not be given the details of your procedure findings, then the procedure report has been included in a sealed envelope for you to review at your convenience later.  YOU SHOULD EXPECT: Some feelings of bloating in the abdomen. Passage of more gas than usual.  Walking can help get rid of the air that was put into your GI tract during the procedure and reduce the bloating. If you had a lower endoscopy (such as a colonoscopy or flexible sigmoidoscopy) you may notice spotting of blood in your stool or on the toilet paper. If you underwent a bowel prep for your procedure, then you may not have a normal bowel movement for a few days.  DIET: Your first meal following the procedure should be a light meal and then it is ok to progress to your normal diet.  A half-sandwich or bowl of soup is an example of a good first meal.  Heavy or fried foods are harder to digest and may make you feel nauseous or bloated.  Likewise meals heavy in dairy and vegetables can cause extra gas to form and this can also increase the bloating.  Drink plenty of fluids but you should avoid alcoholic beverages for 24 hours.  ACTIVITY: Your care partner should take you home directly after the procedure.  You should plan to take it easy, moving slowly for the rest of the day.  You can resume normal activity the day after the procedure however you should NOT DRIVE or use heavy machinery for 24 hours (because of the sedation medicines used during the test).    SYMPTOMS TO REPORT IMMEDIATELY: A gastroenterologist can be reached at any hour.  During normal business hours, 8:30 AM to 5:00 PM Monday through Friday,  call [phone].  After hours and on weekends, please call the GI answering service at [phone] who will take a message and have the physician on call contact you.   Following lower endoscopy (colonoscopy or flexible sigmoidoscopy):  Excessive amounts of blood in the stool  Significant tenderness or worsening of abdominal pains  Swelling of the abdomen that is new, acute  Fever of 100F or higher  FOLLOW UP: If any biopsies were taken you will be contacted by phone or by letter within the next 1-3 weeks.  Call your gastroenterologist if you have not heard about the biopsies in 3 weeks.  Our staff will call the home number listed on your records the next business day following your procedure to check on you and address any questions or concerns that you may have at that time regarding the information given to you following your procedure. This is a courtesy call and so if there is no answer at the home number and we have not heard from you through the emergency physician on call, we will assume that you have returned to your regular daily activities without incident.  SIGNATURES/CONFIDENTIALITY: You and/or your care partner have signed paperwork which will be entered into your electronic medical record.  These signatures attest to the fact that that the information above on your After Visit Summary has been reviewed and is understood.  Full responsibility of the confidentiality of this   discharge information lies with you and/or your care-partner.  Resume medications. Information given on diverticulosis and high fiber diet with discharge instructions. 

## 2012-09-27 ENCOUNTER — Telehealth: Payer: Self-pay | Admitting: *Deleted

## 2012-09-27 NOTE — Telephone Encounter (Signed)
Left message on number given in admitting per pt. ewm

## 2012-12-13 ENCOUNTER — Telehealth: Payer: Self-pay | Admitting: *Deleted

## 2012-12-13 NOTE — Telephone Encounter (Signed)
Left vm for pt to return call to r/s f/u appt with new provider.

## 2012-12-21 ENCOUNTER — Encounter: Payer: Self-pay | Admitting: *Deleted

## 2012-12-21 NOTE — Progress Notes (Signed)
Awaiting patient response I have cancelled her appts. 

## 2012-12-21 NOTE — Progress Notes (Signed)
Left message, Mailed letter, Awaiting pt response.

## 2013-02-11 ENCOUNTER — Ambulatory Visit: Payer: BC Managed Care – PPO | Admitting: Oncology

## 2013-02-11 ENCOUNTER — Other Ambulatory Visit: Payer: BC Managed Care – PPO | Admitting: Lab

## 2013-03-17 ENCOUNTER — Telehealth (INDEPENDENT_AMBULATORY_CARE_PROVIDER_SITE_OTHER): Payer: Self-pay | Admitting: General Surgery

## 2013-03-17 NOTE — Telephone Encounter (Signed)
Patient calling to see what kind of compression sleeve she needs. I advised that she would usually be measured for the sleeve with the physical therapists and they would tell her what kind of sleeve she needs. She will contact them.

## 2013-04-12 ENCOUNTER — Telehealth: Payer: Self-pay | Admitting: *Deleted

## 2013-04-12 NOTE — Telephone Encounter (Signed)
Pt left me a message about getting an appt w/ new provider since Dr. Donnie Coffin is no longer here and I called and left her a message to call me back so I can get her scheduled.

## 2013-04-19 ENCOUNTER — Telehealth: Payer: Self-pay | Admitting: *Deleted

## 2013-04-19 NOTE — Telephone Encounter (Signed)
Called pt to get her reestablished w/ a new provider since Dr. Donnie Coffin is no longer here and got no message, no machine.  Will try back later.

## 2013-05-25 ENCOUNTER — Telehealth: Payer: Self-pay | Admitting: *Deleted

## 2013-05-25 NOTE — Telephone Encounter (Signed)
Pt left me a message about getting scheduled w/ a new provider since Dr. Donnie Coffin is no longer here and I called and left a message w/ her son to have her to call me so I can get her scheduled.

## 2013-06-01 ENCOUNTER — Telehealth: Payer: Self-pay | Admitting: *Deleted

## 2013-06-01 NOTE — Telephone Encounter (Signed)
Pt returned our calls and requested to see Dr. Darnelle Catalan.  Confirmed 06/14/13 appt w/ pt.  Mailed calendar to pt.

## 2013-06-14 ENCOUNTER — Ambulatory Visit (HOSPITAL_BASED_OUTPATIENT_CLINIC_OR_DEPARTMENT_OTHER): Payer: BC Managed Care – PPO | Admitting: Lab

## 2013-06-14 ENCOUNTER — Telehealth: Payer: Self-pay | Admitting: *Deleted

## 2013-06-14 ENCOUNTER — Encounter: Payer: Self-pay | Admitting: Family

## 2013-06-14 ENCOUNTER — Ambulatory Visit (HOSPITAL_BASED_OUTPATIENT_CLINIC_OR_DEPARTMENT_OTHER): Payer: BC Managed Care – PPO | Admitting: Family

## 2013-06-14 VITALS — BP 137/84 | HR 67 | Temp 98.5°F | Resp 20 | Ht 65.0 in | Wt 192.1 lb

## 2013-06-14 DIAGNOSIS — Z853 Personal history of malignant neoplasm of breast: Secondary | ICD-10-CM

## 2013-06-14 DIAGNOSIS — C50419 Malignant neoplasm of upper-outer quadrant of unspecified female breast: Secondary | ICD-10-CM

## 2013-06-14 LAB — CBC WITH DIFFERENTIAL/PLATELET
Basophils Absolute: 0 10*3/uL (ref 0.0–0.1)
Eosinophils Absolute: 0.1 10*3/uL (ref 0.0–0.5)
HCT: 41 % (ref 34.8–46.6)
HGB: 14.1 g/dL (ref 11.6–15.9)
LYMPH%: 21.3 % (ref 14.0–49.7)
MCV: 91.5 fL (ref 79.5–101.0)
MONO%: 6.1 % (ref 0.0–14.0)
NEUT#: 3.9 10*3/uL (ref 1.5–6.5)
NEUT%: 69.6 % (ref 38.4–76.8)
Platelets: 196 10*3/uL (ref 145–400)
RDW: 13.5 % (ref 11.2–14.5)

## 2013-06-14 LAB — COMPREHENSIVE METABOLIC PANEL (CC13)
Albumin: 3.8 g/dL (ref 3.5–5.0)
Alkaline Phosphatase: 86 U/L (ref 40–150)
BUN: 20.6 mg/dL (ref 7.0–26.0)
Creatinine: 0.9 mg/dL (ref 0.6–1.1)
Glucose: 82 mg/dl (ref 70–140)
Potassium: 4.3 mEq/L (ref 3.5–5.1)

## 2013-06-14 LAB — LACTATE DEHYDROGENASE (CC13): LDH: 184 U/L (ref 125–245)

## 2013-06-14 MED ORDER — ANASTROZOLE 1 MG PO TABS: 1.0000 mg | ORAL_TABLET | Freq: Every day | ORAL | Status: DC

## 2013-06-14 NOTE — Progress Notes (Addendum)
[facility]  [phone] [phone]  OFFICE PROGRESS NOTE   ID: Andrea Castro   DOB: 03-01-1956  MR#: [id number]  ZHY#:[id number]   PCP: Andrea Castro, M.D. GYN: Andrea Castro, M.D. SU:  Andrea Castro, M.D. RAD ONC: Andrea Castro, M.D. PLA SU: Andrea Castro, M.D. [facility])   HISTORY OF PRESENT ILLNESS: From Andrea Castro's the patient evaluation note dated 12/12/2009: "This is a delightful 57 year old woman from [place] referred by Andrea Castro for evaluation and treatment of breast cancer.  This woman has been in a reasonably good health.  She had noted a cystic abnormality in the left axilla, which she ultimately lanced.  She developed a rash over her breast and tried using Cloderm for this.  She sought some attention at a local dermatology office via the PAs there.  They were concerned about the rash in the actual breast itself.  She was referred to Andrea Castro for exam.  He in turn referred her for diagnostic mammogram, left breast ultrasound on 11/29/2009.  Physical exam at that time showed extensive erythema, peau d'orange changes left breast, most prominent in the medial upper left breast, palpable thickening upper left medial breast, palpable firmness in the left axilla.  The mammogram was compared to one performed in 1998.  This showed skin thickening left breast, numerous pleomorphic calcifications upper outer left breast abutting the area of 5.0 x 4.5 x 5.6 cm, enlarged lymph nodes in the left axilla were seen.  An ultrasound-guided biopsy of the mass and calcifications and enlarged lymph node was performed 11/30/2009.  This showed a grade 3/3 invasive ductal cancer, which was ER and PR negative, proliferative index 28%, HER-2 was 2.65.  A lymph node that was biopsied also showed metastatic disease.  The patient is here for consideration of neoadjuvant therapy."  Her subsequent history is as detailed below.   INTERVAL  HISTORY: Andrea Castro and I saw Andrea Castro today for followup of invasive ductal carcinoma of the left breast.  The patient was last seen by Andrea Castro on 08/05/2012.  Since her last office visit, the patient has been doing relatively well.  She is establishing herself with Andrea Castro service today.   REVIEW OF SYSTEMS: A 10 point review of systems was completed and is negative except right shoulder pain that radiates to her neck.  She first experienced the pain approximately one year ago, again around the holidays in 09/2012, and most recently the pain started again from 03/2013 through 05/2013.  She states the shoulder pain is relieved when she takes NSAIDs.  She states that her left breast is still numb since surgery.  She has ongoing hot flashes and night sweats.  The patient denies any other symptomatology including fatigue, fever or chills, headache, vision changes, swollen glands, cough or shortness of breath, chest pain or discomfort, nausea/vomiting/diarrhea/constipation, change in urinary or bowel habits, unusual bleeding or bruising or any other symptomatology.   PAST MEDICAL HISTORY: Past Medical History  Diagnosis Date  . History of breast cancer   . Arthritis   . Cancer 2011    left breast  . Ruptured disc, thoracic 08/1983  . ACL (anterior cruciate ligament) tear 2002  . Hyphema of right eye     PAST SURGICAL HISTORY: Past Surgical History  Procedure Laterality Date  . Mastectomy  10/30/10    right, total  . Portacath placement  12/17/09  . Back surgery  1984    ruptured disc  .  Torn ligament  2002    left knee  . Tubal ligation  2004  . Mastectomy  05/22/10    left cancer/chemo and radiation  . Reconstruction breast w/ tram flap  02/20/11    bilateral  . Hernia repair  02/20/11    ventral hernia  . Port-a-cath removal  01/09/2012    Procedure: MINOR REMOVAL PORT-A-CATH;  Surgeon: Andrea Castro;  Location: [facility];  Service: General;   Laterality: N/A;  . Nipple construction  08/2011    tattoo at another date  . Knee arthroscopy with anterior cruciate ligament (acl) repair Left 03/2010    FAMILY HISTORY Family History  Problem Relation Age of Onset  . COPD Father   . Hypertension Father   . Hypertension Mother   . Parkinson's disease Mother     GYNECOLOGIC HISTORY: G5, P3, 2 miscarriages.  Menarche age 34.  Age of parity 35.  Her last menstrual period was in 11/2009.  She did have a previous uterine biopsy.  She used birth control pills from the ages of 57 through 22.   SOCIAL HISTORY: Andrea Castro were married in 1979.  Her husband Andrea Castro has worked at  [employer] since 1972.  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  The patient and her husband are originally from [place].  They have 3 children - two sons and one daughter.   There is no other history of breast or ovarian cancer in the family.  In her spare time she enjoys spending time with her family, walking/running, and being outdoors.   ADVANCED DIRECTIVES: Not on file  HEALTH MAINTENANCE: History  Substance Use Topics  . Smoking status: Never Smoker   . Smokeless tobacco: Never Used  . Alcohol Use: No    Colonoscopy: 09/24/2012 PAP: Not on file Bone density:  The patient's last bone density scan on 06/22/2012 showed a T score of 1.7 (normal). Lipid panel:  Not on file  No Known Allergies  Current Outpatient Prescriptions  Medication Sig Dispense Refill  . aspirin 325 MG tablet Take 325 mg by mouth daily.      . Biotin 2500 MCG CAPS Take 2,500 mcg by mouth daily.       . calcium carbonate (OS-CAL) 600 MG TABS Take 600 mg by mouth 2 (two) times daily with a meal.        . Cholecalciferol (VITAMIN D-3) 1000 UNITS CAPS Take 2,000 Units by mouth daily.       . Ferrous Gluconate (IRON) 240 (27 FE) MG TABS Take 240 mg by mouth as needed.       . fish oil-omega-3 fatty acids 1000 MG capsule Take 1 g by  mouth 2 (two) times daily.       . Glucosamine HCl 1000 MG TABS Take 1,000 mg by mouth 2 (two) times daily.       . Multiple Vitamin (MULTIVITAMIN PO) Take by mouth daily.        Marland Kitchen anastrozole (ARIMIDEX) 1 MG tablet Take 1 tablet (1 mg total) by mouth daily.  90 tablet  5   No current facility-administered medications for this visit.    OBJECTIVE: Filed Vitals:   06/14/13 0951  BP: 137/84  Pulse: 67  Temp: 98.5 F (36.9 C)  Resp: 20     Body mass index is 31.97 kg/(m^2).      ECOG FS: 1 - Symptomatic but completely ambulatory  General  appearance: Alert, cooperative, well nourished, no apparent distress Head: Normocephalic, without obvious abnormality, atraumatic Eyes: Conjunctivae/corneas clear, PERRLA, EOMI, right pupil is larger than the left pupil Nose: Nares, septum and mucosa are normal, no drainage or sinus tenderness Neck: No adenopathy, supple, symmetrical, trachea midline, thyroid not enlarged, no tenderness Resp: Clear to auscultation bilaterally Cardio: Regular rate and rhythm, S1, S2 normal, no murmur, click, rub or gallop Breasts: Bilateral breast reconstruction with TRAM flaps, bilateral well-healed surgical scars, bilateral axillary fullness  GI: Soft, distended, non-tender, hypoactive bowel sounds, no organomegaly Skin: Numerous nevi on trunk area Extremities: Extremities normal, atraumatic, no cyanosis or edema Lymph nodes: Cervical, supraclavicular, and axillary nodes normal Neurologic: Grossly normal    LAB RESULTS: Lab Results  Component Value Date   WBC 5.6 06/14/2013   NEUTROABS 3.9 06/14/2013   HGB 14.1 06/14/2013   HCT 41.0 06/14/2013   MCV 91.5 06/14/2013   PLT 196 06/14/2013      Chemistry      Component Value Date/Time   NA 142 06/14/2013 1131   NA 141 11/20/2011 0836   K 4.3 06/14/2013 1131   K 4.4 11/20/2011 0836   CL 105 08/05/2012 1355   CL 105 11/20/2011 0836   CO2 26 06/14/2013 1131   CO2 27 11/20/2011 0836   BUN 20.6 06/14/2013 1131   BUN  20 11/20/2011 0836   CREATININE 0.9 06/14/2013 1131   CREATININE 1.00 11/20/2011 0836      Component Value Date/Time   CALCIUM 10.5* 06/14/2013 1131   CALCIUM 9.6 11/20/2011 0836   ALKPHOS 86 06/14/2013 1131   ALKPHOS 88 11/20/2011 0836   AST 16 06/14/2013 1131   AST 17 11/20/2011 0836   ALT 21 06/14/2013 1131   ALT 17 11/20/2011 0836   BILITOT 0.45 06/14/2013 1131   BILITOT 0.5 11/20/2011 0836      Lab Results  Component Value Date   LABCA2 50* 08/05/2012    Urinalysis    Component Value Date/Time   COLORURINE YELLOW 10/25/2010 1028   APPEARANCEUR CLEAR 10/25/2010 1028   LABSPEC 1.010 10/25/2010 1028   PHURINE 5.5 10/25/2010 1028   GLUCOSEU NEGATIVE 05/20/2010 1300   HGBUR TRACE* 10/25/2010 1028   BILIRUBINUR NEGATIVE 10/25/2010 1028   KETONESUR NEGATIVE 10/25/2010 1028   PROTEINUR NEGATIVE 10/25/2010 1028   UROBILINOGEN 0.2 10/25/2010 1028   NITRITE NEGATIVE 10/25/2010 1028   LEUKOCYTESUR NEGATIVE 10/25/2010 1028    STUDIES: 1.  The patient's last bilateral digital screening mammogram on 06/22/2012 showed the patient has had bilateral mastectomy and TRAM flap reconstructions.  Views of the TRAM flaps are negative. No mammographic evidence of malignancy.  Benign findings.  2.  The patient's last bone density scan on 06/22/2012 showed a T score of 1.7 (normal).  ASSESSMENT: 57 y.o. [place], [place] woman: 1.  The patient had a left breast needle core biopsy of the 1 o'clock position with left axillary needle core biopsy on 11/30/2009 which showed in the left breast invasive ductal carcinoma in the lymph node metastatic carcinoma, estrogen receptor negative, progesterone receptor negative, Ki-67 40%, HER-2/neu by CISH showed amplification with a ratio of 2.65.  2.  The patient had a bilateral breast MRI on 12/10/2009 which showed there is a dominant heterogeneously enhancing irregular mass with washout kinetics and central biopsy clip artifact in the middle third of the upper outer left breast  at approximately 1 o'clock position that  measures 6.0 x 4.2 x 3.7 cm and corresponds to the biopsy-proven invasive ductal  carcinoma.  Extending anteriorly, medially, superiorly, and inferiorly from the dominant mass is asymmetric nodular nonmass-like enhancement that demonstrate a combination of washout and plateau kinetics and involves all four quadrants of the left breast.  On T2-weighted imaging, there is abnormal asymmetric edema throughout the parenchyma of the left breast and there is marked skin thickening of the entire left breast, most prominent medially, corresponding to the area of peau d'orange seen on clinical exam. The skin thickness measures up to 1.3 cm, and the skin of the medial left breast abnormally enhances. There are multiple abnormal left axillary lymph nodes. The largest node or conglomerate of nodes is deep to the pectoralis minor muscle that measures 3.0 x 2.8 cm maximal diameter.  Additionally, there are multiple level I axillary lymph nodes suspicious for metastatic involvement.  No interpectoral or internal mammary lymphadenopathy is identified. The right axillary lymph nodes are normal in appearance. No mass, suspicious area of enhancement, skin thickening, or edema is identified in the right breast.  The skin of the right breast is normal in appearance. In the left lobe of the liver is a 6 mm T2 bright oval lesion. In the right lobe of the liver is a 4 mm T2 bright oval lesion (clinical stage IIIB, T4 N1).  A subsequent CT of the abdomen and pelvis in 11/2009 showed that the liver lesions were hepatic cysts.  3.  The patient had echocardiogram on 12/20/2009 which showed an estimated ejection fraction of 60%.  4.  The patient started neoadjuvant chemotherapy with TCH (Taxotere/Carboplatin/Herceptin) x 6 cycles from 12/28/2009 through 04/19/2010 with Neulasta support.  Anti-HER-2 therapy with Herceptin continued until 12/27/2010.  5.  Status post left breast modified radical  mastectomy with axillary contents on 05/22/2010 for a stage I, ypTX , ypN1MI, MX, residual ductal carcinoma was identified within angiolymphatic and dense stromal fibrosis with microcalcifications, all surgical margins were negative for tumor, estrogen receptor 3% positive, progesterone receptor negative, Ki-67 40%, HER-2/neu by CISH showed amplification with a ratio 2.58, 2/27 metastatic left axillary lymph nodes, with 2 isolated tumor cells identified.  6.  The patient had radiation therapy from 07/09/2010 through 08/19/2010.    7.  Status post right breast simple mastectomy on 10/30/2010 which showed benign breast parenchyma with stromal fibrosis and scattered microcalcifications.  8.  Breast reconstruction surgery with bilateral transverse rectus abdominis myocutaneous flap in 02/2011.  9.  The patient did not want to pursue genetic testing.  Castro: Andrea Castro had a estrogen receptor positive breast cancer but has not received antiestrogen therapy to date.  Andrea Castro would like the patient to start antiestrogen therapy with Anastrozole 1 mg by mouth daily today. An electronic prescription for Anastrozole #90 with 5 refills was sent to the patient's pharmacy.  Andrea Castro and the patient had a discussion about the benefits and common side effects associated with this medication.  The common side effects associated with aromatase inhibitors include vaginal dryness, hot flashes,night sweats and possible bone thinning.  The patient was encouraged to continue taking calcium carbonate with vitamin D3 by mouth twice daily in addition to walking daily for possible bone thinning.    We Castro to see Andrea Castro again in 3 months to assess for tolerance of Anastrozole.  We will check laboratories of CBC, CMP, LDH, and vitamin D level at that time.    All questions were answered.  The patient was encouraged to contact us in the interim with any problems, questions or concerns.   Adela Lank  Ardath Lepak,  NP-C 06/16/2013, 4:18 PM  ADDENDUM: This 57 year old [place] woman is established herself in my practice today. We reviewed her diagnosis, treatment history, and prognosis.  In brief: She presented with locally advanced left breast cancer (peu d'orange) and left breast and left axilla lymph node biopsy February of 2011; both were positive for an invasive ductal carcinoma, grade 2, estrogen and progesterone receptor negative, with an MIB-1 of 40%, but HER-2 amplification by CISH with a ratio of 2.65. Clinically, this was a T4, N1, stage IIIB tumor.  She was treated neoadjuvant [place] with docetaxel, cyclophosphamide and trastuzumab x6, completed in July of 2011. Trastuzumab was continued until March of 2012.  In August of 2011 she underwent left modified radical mastectomy showing, in the breast, evidence of residual lymphovascular invasion but no measurable mass. 2 of 27 axillary lymph nodes were involved, one of them microscopically (ypTX, ypN1). Repeat prognostic panel showed the tumor a GEN to be HER-2 positive with a ratio of 2.58, and progesterone receptor negative. There was however some estrogen receptor positivity at 3%.  Adjuvantly the patient completed radiation therapy December of 2011. She underwent prophylactic right mastectomy January of 2012, with benign pathology.  Andrea Castro understands that with only 3% positivity, the contribution of antiestrogen screw risk reduction in her situation is not likely to be great. On the other hand if she can tolerate these agents well, they may reduce the risk of recurrence to some extent. We did discuss the possible toxicities side effects and complications of anastrozole and she is interested in trying this agent.  Accordingly she will be started on anastrozole now. She will see [place] again in 3 months to make sure she is tolerating it well. Note that she had a normal bone density in September of 2013.  Sitlali had bilateral TRAM flaps at [facility] in  may of 2012. I do not believe she will need any further radiologic evaluation of her breasts, but can be followed by physical exam alone. The patient has a good understanding of the overall Castro, and is very much in agreement with that.  I personally saw this patient and performed a substantive portion of this encounter with the listed APP documented above.   Lowella Dell, Castro

## 2013-06-14 NOTE — Patient Instructions (Addendum)
Please contact us at [phone] if you have any questions or concerns.  Please continue to do well and enjoy life!!!  Get plenty of rest, drink plenty of water, exercise daily (walking), eat a balanced diet.  Take calcium daily in addition to vitamin D3 daily.   Complete monthly self-breast examinations.  Have a clinical breast exam by a physician every year.

## 2013-06-14 NOTE — Telephone Encounter (Signed)
appts made and printed...td 

## 2013-06-15 ENCOUNTER — Other Ambulatory Visit: Payer: Self-pay | Admitting: Oncology

## 2013-06-16 ENCOUNTER — Encounter: Payer: Self-pay | Admitting: Family

## 2013-06-16 ENCOUNTER — Telehealth: Payer: Self-pay | Admitting: Family

## 2013-06-16 NOTE — Telephone Encounter (Deleted)
Error

## 2013-06-16 NOTE — Progress Notes (Signed)
This encounter was created in error - please disregard.

## 2013-06-19 ENCOUNTER — Encounter: Payer: Self-pay | Admitting: Family

## 2013-06-19 NOTE — Progress Notes (Signed)
This encounter was created in error - please disregard.

## 2013-09-22 ENCOUNTER — Ambulatory Visit (HOSPITAL_BASED_OUTPATIENT_CLINIC_OR_DEPARTMENT_OTHER): Payer: BC Managed Care – PPO | Admitting: Oncology

## 2013-09-22 ENCOUNTER — Other Ambulatory Visit: Payer: Self-pay | Admitting: Oncology

## 2013-09-22 ENCOUNTER — Other Ambulatory Visit (HOSPITAL_BASED_OUTPATIENT_CLINIC_OR_DEPARTMENT_OTHER): Payer: BC Managed Care – PPO | Admitting: Lab

## 2013-09-22 ENCOUNTER — Telehealth: Payer: Self-pay | Admitting: *Deleted

## 2013-09-22 VITALS — BP 144/99 | HR 60 | Temp 98.8°F | Resp 18 | Ht 65.0 in | Wt 192.4 lb

## 2013-09-22 DIAGNOSIS — Z853 Personal history of malignant neoplasm of breast: Secondary | ICD-10-CM

## 2013-09-22 DIAGNOSIS — M8580 Other specified disorders of bone density and structure, unspecified site: Secondary | ICD-10-CM

## 2013-09-22 DIAGNOSIS — C50419 Malignant neoplasm of upper-outer quadrant of unspecified female breast: Secondary | ICD-10-CM

## 2013-09-22 DIAGNOSIS — Z171 Estrogen receptor negative status [ER-]: Secondary | ICD-10-CM

## 2013-09-22 DIAGNOSIS — C773 Secondary and unspecified malignant neoplasm of axilla and upper limb lymph nodes: Secondary | ICD-10-CM

## 2013-09-22 LAB — COMPREHENSIVE METABOLIC PANEL (CC13)
ALT: 19 U/L (ref 0–55)
Anion Gap: 10 mEq/L (ref 3–11)
CO2: 26 mEq/L (ref 22–29)
Calcium: 9.9 mg/dL (ref 8.4–10.4)
Chloride: 107 mEq/L (ref 98–109)
Sodium: 143 mEq/L (ref 136–145)
Total Protein: 7.6 g/dL (ref 6.4–8.3)

## 2013-09-22 LAB — CBC WITH DIFFERENTIAL/PLATELET
BASO%: 0.7 % (ref 0.0–2.0)
HCT: 42.1 % (ref 34.8–46.6)
MCHC: 33.3 g/dL (ref 31.5–36.0)
MONO#: 0.4 10*3/uL (ref 0.1–0.9)
RBC: 4.43 10*6/uL (ref 3.70–5.45)
WBC: 5.9 10*3/uL (ref 3.9–10.3)
lymph#: 1.2 10*3/uL (ref 0.9–3.3)

## 2013-09-22 NOTE — Progress Notes (Signed)
[facility]  Telephone:(336) [phone] Fax:(336) [phone]  OFFICE PROGRESS NOTE   ID: Andrea Castro   DOB: 1955-12-20  MR#: [id number]  JYN#:[id number]   PCP: Pearson Grippe, M.D.  GYN: Tracey Harries, M.D. SU:  Cyndia Bent, M.D. RAD ONC: Lurline Hare, M.D. PLA SU: Tamela Oddi, M.D. [facility])   HISTORY OF PRESENT ILLNESS: From Dr. Theron Arista Rubin's intake note dated 12/12/2009:  "This is a delightful 57 year old woman from [place] referred by Dr. Jamey Ripa for evaluation and treatment of breast cancer.  This woman has been in a reasonably good health.  She had noted a cystic abnormality in the left axilla, which she ultimately lanced.  She developed a rash over her breast and tried using Cloderm for this.  She sought some attention at a local dermatology office via the PAs there.  They were concerned about the rash in the actual breast itself.  She was referred to Dr. Ambrose Mantle for exam.  He in turn referred her for diagnostic mammogram, left breast ultrasound on 11/29/2009.  Physical exam at that time showed extensive erythema, peau d'orange changes left breast, most prominent in the medial upper left breast, palpable thickening upper left medial breast, palpable firmness in the left axilla.  The mammogram was compared to one performed in 1998.  This showed skin thickening left breast, numerous pleomorphic calcifications upper outer left breast abutting the area of 5.0 x 4.5 x 5.6 cm, enlarged lymph nodes in the left axilla were seen.  An ultrasound-guided biopsy of the mass and calcifications and enlarged lymph node was performed 11/30/2009.  This showed a grade 3/3 invasive ductal cancer, which was ER and PR negative, proliferative index 28%, HER-2 was 2.65.  A lymph node that was biopsied also showed metastatic disease.  The patient is here for consideration of neoadjuvant therapy."    Her subsequent history is as detailed below.   INTERVAL HISTORY:  Andrea Castro (57) returns today for followup of her breast cancer. She started anastrozole 3 months ago. She is tolerating it well, with mild hot flashes and no significant vaginal dryness problems. She was able to get up for less than $10 a month. She is trying to walk 30 minutes most days as her T4 with exercise  REVIEW OF SYSTEMS: Stephonie denies unusual headaches, visual changes, nausea, vomiting, stiff neck, dizziness, or gait imbalance. There has been no cough, phlegm production, or pleurisy, no chest pain or pressure, and no change in bowel or bladder habits. Has been no fever, rash, bleeding, unexplained fatigue or unexplained weight loss. A detailed review of systems was otherwise entirely negative.    PAST MEDICAL HISTORY: Past Medical History  Diagnosis Date  . History of breast cancer   . Arthritis   . Cancer 2011    left breast  . Ruptured disc, thoracic 08/1983  . ACL (anterior cruciate ligament) tear 2002  . Hyphema of right eye     PAST SURGICAL HISTORY: Past Surgical History  Procedure Laterality Date  . Mastectomy  10/30/10    right, total  . Portacath placement  12/17/09  . Back surgery  1984    ruptured disc  . Torn ligament  2002    left knee  . Tubal ligation  2004  . Mastectomy  05/22/10    left cancer/chemo and radiation  . Reconstruction breast w/ tram flap  02/20/11    bilateral  . Hernia repair  02/20/11    ventral hernia  . Port-a-cath removal  01/09/2012    Procedure: MINOR REMOVAL PORT-A-CATH;  Surgeon: Currie Paris, MD;  Location: [facility];  Service: General;  Laterality: N/A;  . Nipple construction  08/2011    tattoo at another date  . Knee arthroscopy with anterior cruciate ligament (acl) repair Left 03/2010    FAMILY HISTORY Family History  Problem Relation Age of Onset  . COPD Father   . Hypertension Father   . Hypertension Mother   . Parkinson's disease Mother    There is no history of breast or ovarian cancer in the  family.  GYNECOLOGIC HISTORY: G5, P3, 2 miscarriages.  Menarche age 43.  Age of parity 89.  Her last menstrual period was in 11/2009.  She did have a previous uterine biopsy.  She used birth control pills from the ages of 70 through 36.   SOCIAL HISTORY: (Updated December 2014 Mr. and Mrs. Skalicky were married in 1979.  Her husband Sharl Ma has worked at  [employer] since 1972.  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  The patient and her husband are originally from [place].  They have 3 children - a daughter aged 48 and sons aged 70 and 73. All are doing great in school.    In her spare time she enjoys spending time with her family, walking/running, and being outdoors.   ADVANCED DIRECTIVES: Not on file  HEALTH MAINTENANCE: History  Substance Use Topics  . Smoking status: Never Smoker   . Smokeless tobacco: Never Used  . Alcohol Use: No    Colonoscopy: 09/24/2012 PAP: Not on file Bone density:  The patient's last bone density scan on 06/22/2012 showed a T score of 1.7 (normal). Lipid panel:  Not on file  No Known Allergies  Current Outpatient Prescriptions  Medication Sig Dispense Refill  . anastrozole (ARIMIDEX) 1 MG tablet Take 1 tablet (1 mg total) by mouth daily.  90 tablet  5  . aspirin 325 MG tablet Take 325 mg by mouth daily.      . Biotin 2500 MCG CAPS Take 2,500 mcg by mouth daily.       . calcium carbonate (OS-CAL) 600 MG TABS Take 600 mg by mouth 2 (two) times daily with a meal.        . Cholecalciferol (VITAMIN D-3) 1000 UNITS CAPS Take 2,000 Units by mouth daily.       . Ferrous Gluconate (IRON) 240 (27 FE) MG TABS Take 240 mg by mouth as needed.       . fish oil-omega-3 fatty acids 1000 MG capsule Take 1 g by mouth 2 (two) times daily.       . Glucosamine HCl 1000 MG TABS Take 1,000 mg by mouth 2 (two) times daily.       . Multiple Vitamin (MULTIVITAMIN PO) Take by mouth daily.         No current facility-administered  medications for this visit.    OBJECTIVE: Middle-aged white woman in no acute distress Filed Vitals:   09/22/13 0848  BP: 144/99  Pulse: 60  Temp: 98.8 F (37.1 C)  Resp: 18     Body mass index is 32.02 kg/(m^2).      ECOG FS: 0 - Asymptomatic  Sclerae unicteric, pupils round and equal Oropharynx clear and moist-- no thrush No cervical or supraclavicular adenopathy Lungs no rales or rhonchi Heart regular rate and rhythm Abd soft, nontender, positive bowel sounds MSK no focal spinal tenderness, no upper  extremity lymphedema Neuro: nonfocal, well oriented, appropriate affect Breasts: Status post bilateral mastectomies with bilateral flap reconstruction. There are no suspicious findings on either side. Both axillae are benign.     LAB RESULTS: Lab Results  Component Value Date   WBC 5.9 09/22/2013   NEUTROABS 4.1 09/22/2013   HGB 14.0 09/22/2013   HCT 42.1 09/22/2013   MCV 95.0 09/22/2013   PLT 188 09/22/2013      Chemistry      Component Value Date/Time   NA 142 06/14/2013 1131   NA 141 11/20/2011 0836   K 4.3 06/14/2013 1131   K 4.4 11/20/2011 0836   CL 105 08/05/2012 1355   CL 105 11/20/2011 0836   CO2 26 06/14/2013 1131   CO2 27 11/20/2011 0836   BUN 20.6 06/14/2013 1131   BUN 20 11/20/2011 0836   CREATININE 0.9 06/14/2013 1131   CREATININE 1.00 11/20/2011 0836      Component Value Date/Time   CALCIUM 10.5* 06/14/2013 1131   CALCIUM 9.6 11/20/2011 0836   ALKPHOS 86 06/14/2013 1131   ALKPHOS 88 11/20/2011 0836   AST 16 06/14/2013 1131   AST 17 11/20/2011 0836   ALT 21 06/14/2013 1131   ALT 17 11/20/2011 0836   BILITOT 0.45 06/14/2013 1131   BILITOT 0.5 11/20/2011 0836      Lab Results  Component Value Date   LABCA2 50* 08/05/2012    Urinalysis    Component Value Date/Time   COLORURINE YELLOW 10/25/2010 1028   APPEARANCEUR CLEAR 10/25/2010 1028   LABSPEC 1.010 10/25/2010 1028   PHURINE 5.5 10/25/2010 1028   GLUCOSEU NEGATIVE 05/20/2010 1300   HGBUR TRACE* 10/25/2010 1028    BILIRUBINUR NEGATIVE 10/25/2010 1028   KETONESUR NEGATIVE 10/25/2010 1028   PROTEINUR NEGATIVE 10/25/2010 1028   UROBILINOGEN 0.2 10/25/2010 1028   NITRITE NEGATIVE 10/25/2010 1028   LEUKOCYTESUR NEGATIVE 10/25/2010 1028    STUDIES: last bone density scan on 06/22/2012 showed a T score of 1.7 (normal).  ASSESSMENT: 57 y.o. [place] woman:  1.  The patient had a left breast upper outer quadrant and left axillary needle core biopsy on 11/30/2009, both positive for a clinical T3 N1-2, stage IIIA invasive ductal carcinoma, estrogen receptor negative, progesterone receptor negative, Ki-67 40%, HER-2/neu amplified by CISH with a ratio of 2.65.  2.  received neoadjuvant chemotherapy with docetaxel, carboplatin and trastuzumab x 6 cycles from 12/28/2009 through 04/19/2010 with Neulasta support.  Trastuzumab was continued to complete one year (2tol 12/27/2010).  3.  Status post left modified radical mastectomy 05/22/2010 for a ypTX , ypN1 residual invasive ductal carcinoma, estrogen receptor 3% positive, progesterone receptor negative, Ki-67 40%, HER-2/neu by CISH again amplified with a ratio 2.58,  4.  The patient had radiation therapy from 07/09/2010 through 08/19/2010.    5.  Status post right simple mastectomy on 10/30/2010 with benign pathology  6.  status post reconstruction  with bilateral transverse rectus abdominis myocutaneous flaps in 02/2011.  7.  The patient declined genetic testing.  8. anastrozole started August 2014  PLAN: Enolia is tolerating the anastrozole well, and though we both understand the likely benefits are small, so long as there are no significant side effects and cost is not an issue, the plan will be to continue that for 5 years.  I am going to see her again in October of 2015. She will have her next bone density in September of 2015. At this point I am delighted that she will soon be 3 years  out from her left mastectomy with no evidence of disease recurrence. She knows to  call for any problems that may develop before her next visit here.    Lowella Dell, MD 09/22/2013 9:37 AM

## 2013-09-22 NOTE — Telephone Encounter (Signed)
appts made and printed...td 

## 2013-09-23 NOTE — Addendum Note (Signed)
Addended by: Billey Co on: 09/23/2013 11:54 AM   Modules accepted: Orders

## 2013-12-01 ENCOUNTER — Telehealth (INDEPENDENT_AMBULATORY_CARE_PROVIDER_SITE_OTHER): Payer: Self-pay | Admitting: *Deleted

## 2013-12-01 NOTE — Telephone Encounter (Signed)
Patient had called and spoke to [facility] at the front desk regarding her yearly mammogram and whether it was necessary.  I spoke to Dr. Marlou Starks who is willing to see the patient to continue f/u's due to breast ca diagnosis in 2011 and/or willing to place the order for her yearly mammogram.  Left a message for patient to call back so I can discuss with her what she would like to do.

## 2014-06-16 ENCOUNTER — Other Ambulatory Visit: Payer: Self-pay | Admitting: *Deleted

## 2014-06-16 DIAGNOSIS — Z853 Personal history of malignant neoplasm of breast: Secondary | ICD-10-CM

## 2014-06-16 MED ORDER — ANASTROZOLE 1 MG PO TABS: 1.0000 mg | ORAL_TABLET | Freq: Every day | ORAL | Status: DC

## 2014-06-27 ENCOUNTER — Ambulatory Visit
Admission: RE | Admit: 2014-06-27 | Discharge: 2014-06-27 | Disposition: A | Discharge status: Home / Self Care | Payer: BC Managed Care – PPO | Source: Ambulatory Visit | Attending: Oncology | Admitting: Oncology

## 2014-06-27 DIAGNOSIS — M8580 Other specified disorders of bone density and structure, unspecified site: Secondary | ICD-10-CM

## 2014-07-06 ENCOUNTER — Telehealth: Payer: Self-pay | Admitting: *Deleted

## 2014-07-06 NOTE — Telephone Encounter (Signed)
Bone density scan from 06/27/14 received. Copy given to Dr. Jana Hakim for review and original sent to HIM to be scanned into patient's chart.

## 2014-07-17 ENCOUNTER — Other Ambulatory Visit (INDEPENDENT_AMBULATORY_CARE_PROVIDER_SITE_OTHER): Payer: Self-pay | Admitting: General Surgery

## 2014-07-17 DIAGNOSIS — Z853 Personal history of malignant neoplasm of breast: Secondary | ICD-10-CM

## 2014-07-27 ENCOUNTER — Ambulatory Visit
Admission: RE | Admit: 2014-07-27 | Discharge: 2014-07-27 | Disposition: A | Discharge status: Home / Self Care | Payer: BC Managed Care – PPO | Source: Ambulatory Visit | Attending: General Surgery | Admitting: General Surgery

## 2014-07-27 DIAGNOSIS — Z853 Personal history of malignant neoplasm of breast: Secondary | ICD-10-CM

## 2014-08-02 ENCOUNTER — Other Ambulatory Visit: Payer: Self-pay | Admitting: Emergency Medicine

## 2014-08-02 DIAGNOSIS — Z853 Personal history of malignant neoplasm of breast: Secondary | ICD-10-CM

## 2014-08-03 ENCOUNTER — Other Ambulatory Visit (HOSPITAL_BASED_OUTPATIENT_CLINIC_OR_DEPARTMENT_OTHER): Payer: BC Managed Care – PPO

## 2014-08-03 ENCOUNTER — Ambulatory Visit (HOSPITAL_BASED_OUTPATIENT_CLINIC_OR_DEPARTMENT_OTHER): Payer: BC Managed Care – PPO | Admitting: Oncology

## 2014-08-03 ENCOUNTER — Telehealth: Payer: Self-pay | Admitting: Oncology

## 2014-08-03 VITALS — BP 131/88 | HR 75 | Temp 98.3°F | Resp 20 | Ht 65.0 in | Wt 194.6 lb

## 2014-08-03 DIAGNOSIS — Z853 Personal history of malignant neoplasm of breast: Secondary | ICD-10-CM

## 2014-08-03 DIAGNOSIS — C50912 Malignant neoplasm of unspecified site of left female breast: Secondary | ICD-10-CM

## 2014-08-03 LAB — CBC WITH DIFFERENTIAL/PLATELET
BASO%: 0.7 % (ref 0.0–2.0)
Basophils Absolute: 0 10*3/uL (ref 0.0–0.1)
EOS%: 2.4 % (ref 0.0–7.0)
Eosinophils Absolute: 0.2 10*3/uL (ref 0.0–0.5)
HCT: 39.4 % (ref 34.8–46.6)
HGB: 12.9 g/dL (ref 11.6–15.9)
LYMPH%: 21.3 % (ref 14.0–49.7)
MCH: 30.6 pg (ref 25.1–34.0)
MCHC: 32.7 g/dL (ref 31.5–36.0)
MCV: 93.6 fL (ref 79.5–101.0)
MONO#: 0.4 10*3/uL (ref 0.1–0.9)
MONO%: 5.4 % (ref 0.0–14.0)
NEUT#: 4.5 10*3/uL (ref 1.5–6.5)
NEUT%: 70.2 % (ref 38.4–76.8)
Platelets: 226 10*3/uL (ref 145–400)
RBC: 4.21 10*6/uL (ref 3.70–5.45)
RDW: 13.3 % (ref 11.2–14.5)
WBC: 6.5 10*3/uL (ref 3.9–10.3)
lymph#: 1.4 10*3/uL (ref 0.9–3.3)

## 2014-08-03 LAB — COMPREHENSIVE METABOLIC PANEL (CC13)
ALBUMIN: 3.6 g/dL (ref 3.5–5.0)
ALK PHOS: 94 U/L (ref 40–150)
ALT: 20 U/L (ref 0–55)
AST: 17 U/L (ref 5–34)
Anion Gap: 9 mEq/L (ref 3–11)
BUN: 18.3 mg/dL (ref 7.0–26.0)
CO2: 28 mEq/L (ref 22–29)
Calcium: 10.1 mg/dL (ref 8.4–10.4)
Chloride: 105 mEq/L (ref 98–109)
Creatinine: 0.9 mg/dL (ref 0.6–1.1)
GLUCOSE: 100 mg/dL (ref 70–140)
Potassium: 3.7 mEq/L (ref 3.5–5.1)
SODIUM: 142 meq/L (ref 136–145)
TOTAL PROTEIN: 7.2 g/dL (ref 6.4–8.3)
Total Bilirubin: 0.47 mg/dL (ref 0.20–1.20)

## 2014-08-03 NOTE — Telephone Encounter (Signed)
Gave AVS & cal for oct 2016.-Amber

## 2014-08-03 NOTE — Progress Notes (Signed)
Andrea Castro  Telephone:(336) [phone] Fax:(336) [phone]  OFFICE PROGRESS NOTE   ID: Andrea Castro   DOB: 03/01/1956  MR#: [id number]  FAO#:[id number]   PCP: Andrea Castro, M.D.  GYN: Andrea Castro, M.D. SU:  Andrea Castro, M.D. RAD ONC: Andrea Castro, M.D. PLA SU: Andrea Castro, M.D. ([facility]) OTHER MD: Andrea Castro M.D. (urol)   CHIEF COMPLAINT: Locally advanced breast cancer  CURRENT TREATMENT: Anastrozole   HISTORY OF PRESENT ILLNESS: From Andrea Castro's intake note dated 12/12/2009:  "This is a delightful 57 year old woman from [place] referred by Dr. Margot Castro for evaluation and treatment of breast cancer.  This woman has been in a reasonably good health.  She had noted a cystic abnormality in the left axilla, which she ultimately lanced.  She developed a rash over her breast and tried using Cloderm for this.  She sought some attention at a local dermatology office via the PAs there.  They were concerned about the rash in the actual breast itself.  She was referred to Dr. Ulanda Castro for exam.  He in turn referred her for diagnostic mammogram, left breast ultrasound on 11/29/2009.  Physical exam at that time showed extensive erythema, peau d'orange changes left breast, most prominent in the medial upper left breast, palpable thickening upper left medial breast, palpable firmness in the left axilla.  The mammogram was compared to one performed in 1998.  This showed skin thickening left breast, numerous pleomorphic calcifications upper outer left breast abutting the area of 5.0 x 4.5 x 5.6 cm, enlarged lymph nodes in the left axilla were seen.  An ultrasound-guided biopsy of the mass and calcifications and enlarged lymph node was performed 11/30/2009.  This showed a grade 3/3 invasive ductal cancer, which was ER and PR negative, proliferative index 28%, HER-2 was 2.65.  A lymph node that was biopsied also showed metastatic disease.  The patient is  here for consideration of neoadjuvant therapy."    Her subsequent history is as detailed below.   INTERVAL HISTORY: Andrea Castro returns today for followup of her breast cancer. Interval history is generally unremarkable. She is tolerating anastrozole with no side effects that she is aware of, and in particular her hot flashes and vaginal dryness or not a concern. She is also obtaining the medication at a good cost  REVIEW OF SYSTEMS: Andrea Castro's main problem is stress urinary incontinence. She wears a pad 24 7. She voids frequently. She will let's before exercising, but after taking a walk her pad is wet. She has seen Dr. Vikki Castro for this and he prescribed a medication however it is more than $250 a month son she cannot afford it. In fact economic stress is the other main concern. Aside from these issues, a detailed review of systems today was entirely negative  PAST MEDICAL HISTORY: Past Medical History  Diagnosis Date  . History of breast cancer   . Arthritis   . Cancer 2011    left breast  . Ruptured disc, thoracic 08/1983  . ACL (anterior cruciate ligament) tear 2002  . Hyphema of right eye     PAST SURGICAL HISTORY: Past Surgical History  Procedure Laterality Date  . Mastectomy  10/30/10    right, total  . Portacath placement  12/17/09  . Back surgery  1984    ruptured disc  . Torn ligament  2002    left knee  . Tubal ligation  2004  . Mastectomy  05/22/10    left cancer/chemo and radiation  .  Reconstruction breast w/ tram flap  02/20/11    bilateral  . Hernia repair  02/20/11    ventral hernia  . Port-a-cath removal  01/09/2012    Procedure: MINOR REMOVAL PORT-A-CATH;  Surgeon: Andrea Lasso, MD;  Location: [place];  Service: General;  Laterality: N/A;  . Nipple construction  08/2011    tattoo at another date  . Knee arthroscopy with anterior cruciate ligament (acl) repair Left 03/2010    FAMILY HISTORY Family History  Problem Relation Age of Onset  . COPD  Father   . Hypertension Father   . Hypertension Mother   . Parkinson's disease Mother    There is no history of breast or ovarian cancer in the family.  GYNECOLOGIC HISTORY: G5, P3, 2 miscarriages.  Menarche age 39.  Age of parity 87.  Her last menstrual period was in 11/2009.  She did have a previous uterine biopsy.  She used birth control pills from the ages of 45 through 36.   SOCIAL HISTORY: (Updated December 2014 Andrea Castro were married in 1979.  Her husband Andrea Castro has worked at  [employer] since 1972.  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  The patient and her husband are originally from [place].  They have 3 children - a daughter aged 5 and sons aged 71 and 18. All are doing great in school.    In her spare time she enjoys spending time with her family, walking/running, and being outdoors.   ADVANCED DIRECTIVES: Not on file  HEALTH MAINTENANCE: History  Substance Use Topics  . Smoking status: Never Smoker   . Smokeless tobacco: Never Used  . Alcohol Use: No    Colonoscopy: 09/24/2012 PAP: Not on file Bone density:  The patient's last bone density scan on 06/22/2012 showed a T score of 1.7 (normal). Lipid panel:  Not on file  No Known Allergies  Current Outpatient Prescriptions  Medication Sig Dispense Refill  . anastrozole (ARIMIDEX) 1 MG tablet Take 1 tablet (1 mg total) by mouth daily.  90 tablet  0  . aspirin 325 MG tablet Take 325 mg by mouth daily.      . Biotin 2500 MCG CAPS Take 2,500 mcg by mouth daily.       . calcium-vitamin D (OSCAL WITH D) 500-200 MG-UNIT per tablet Take 2 tablets by mouth.      . Ferrous Gluconate (IRON) 240 (27 FE) MG TABS Take 240 mg by mouth as needed.       . fish oil-omega-3 fatty acids 1000 MG capsule Take 1 g by mouth 2 (two) times daily.       . Glucosamine HCl 1000 MG TABS Take 1,000 mg by mouth 2 (two) times daily.       . Multiple Vitamin (MULTIVITAMIN PO) Take by mouth daily.         Marland Kitchen zinc gluconate 50 MG tablet Take 50 mg by mouth daily.       No current facility-administered medications for this visit.    OBJECTIVE: Middle-aged white woman in no acute distress Filed Vitals:   08/03/14 1337  BP: 131/88  Pulse: 75  Temp: 98.3 F (36.8 C)  Resp: 20     Body mass index is 32.38 kg/(m^2).      ECOG FS: 1 - Symptomatic but completely ambulatory  Sclerae unicteric, EOMs intact Oropharynx clear, teeth in good repair No cervical or supraclavicular adenopathy Lungs no rales  or rhonchi Heart regular rate and rhythm Abd soft, nontender, positive bowel sounds MSK no focal spinal tenderness, no upper extremity lymphedema Neuro: nonfocal, well oriented, positive affect Breasts: Status post bilateral mastectomies with bilateral flap reconstruction. There is no evidence of disease recurrence. Both axillae are benign    LAB RESULTS: Lab Results  Component Value Date   WBC 6.5 08/03/2014   NEUTROABS 4.5 08/03/2014   HGB 12.9 08/03/2014   HCT 39.4 08/03/2014   MCV 93.6 08/03/2014   PLT 226 08/03/2014      Chemistry      Component Value Date/Time   NA 142 08/03/2014 1313   NA 141 11/20/2011 0836   K 3.7 08/03/2014 1313   K 4.4 11/20/2011 0836   CL 105 08/05/2012 1355   CL 105 11/20/2011 0836   CO2 28 08/03/2014 1313   CO2 27 11/20/2011 0836   BUN 18.3 08/03/2014 1313   BUN 20 11/20/2011 0836   CREATININE 0.9 08/03/2014 1313   CREATININE 1.00 11/20/2011 0836      Component Value Date/Time   CALCIUM 10.1 08/03/2014 1313   CALCIUM 9.6 11/20/2011 0836   ALKPHOS 94 08/03/2014 1313   ALKPHOS 88 11/20/2011 0836   AST 17 08/03/2014 1313   AST 17 11/20/2011 0836   ALT 20 08/03/2014 1313   ALT 17 11/20/2011 0836   BILITOT 0.47 08/03/2014 1313   BILITOT 0.5 11/20/2011 0836      Lab Results  Component Value Date   LABCA2 50* 08/05/2012    Urinalysis    Component Value Date/Time   COLORURINE YELLOW 10/25/2010 1028   APPEARANCEUR CLEAR 10/25/2010 1028    LABSPEC 1.010 10/25/2010 1028   PHURINE 5.5 10/25/2010 1028   GLUCOSEU NEGATIVE 05/20/2010 1300   HGBUR TRACE* 10/25/2010 [place] 10/25/2010 [place] 10/25/2010 1028   PROTEINUR NEGATIVE 10/25/2010 1028   UROBILINOGEN 0.2 10/25/2010 1028   NITRITE NEGATIVE 10/25/2010 [place] 10/25/2010 1028    STUDIES: CLINICAL DATA: Osteoporosis screening. Patient is currently on  anastrozole.  EXAM:  DUAL X-RAY ABSORPTIOMETRY (DXA) FOR BONE MINERAL DENSITY  FINDINGS:  AP LUMBAR SPINE L1-L3  Bone Mineral Density (BMD): 1.211 G/cm2  Young Adult T-Score: 1.8  Z-Score: 2.9  Left FEMUR neck  Bone Mineral Density (BMD): 0.925 g/cm2  Young Adult T-Score: 0.7  Z-Score: 1.8  ASSESSMENT: Patient's diagnostic category is NORMAL by WHO Criteria.  FRACTURE RISK: NOT INCREASED.  FRAX: Not calculated due to T-score at or above -1.0.  COMPARISON: 0.4% increase in bone density over the lumbar spine  compared to 06/22/2012 which is not statistically significant. 3%  increase in bone density over the total left hip compared to 2013  which is statistically significant.   Mm Digital Diagnostic Bilat  07/27/2014   CLINICAL DATA:  58 year old female with history of left breast cancer post mastectomy a 2011. History of benign right mastectomy 10/2010. Tram flap reconstruction 02/2011. No reported problems today.  EXAM: DIGITAL DIAGNOSTIC  BILATERAL MAMMOGRAM WITH CAD  COMPARISON:  Prior mammogram dated 06/22/2012  ACR Breast Density Category a: The breast tissue is almost entirely fatty.  FINDINGS: Note that only bilateral MLO views were performed given the presence of tram flaps. No suspicious masses or calcifications are seen in either tram flap. There is no mammographic evidence of malignancy in either breast.  Mammographic images were processed with CAD.  IMPRESSION: No mammographic evidence of malignancy in either breast.  RECOMMENDATION: Recommend clinical follow-up  given  history of bilateral tram flap reconstruction.  I have discussed the findings and recommendations with the patient. Results were also provided in writing at the conclusion of the visit. If applicable, a reminder letter will be sent to the patient regarding the next appointment.  BI-RADS CATEGORY  2: Benign.   Electronically Signed   By: Everlean Alstrom M.D.   On: 07/27/2014 13:57     ASSESSMENT: 58 y.o. [place] woman:  1.  The patient had a left breast upper outer quadrant and left axillary needle core biopsy on 11/30/2009, both positive for a clinical T3 N1-2, stage IIIA invasive ductal carcinoma, estrogen receptor negative, progesterone receptor negative, Ki-67 40%, HER-2/neu amplified by CISH with a ratio of 2.65.  2.  received neoadjuvant chemotherapy with docetaxel, carboplatin and trastuzumab x 6 cycles from 12/28/2009 through 04/19/2010 with Neulasta support.  Trastuzumab was continued to complete one year (2tol 12/27/2010).  3.  Status post left modified radical mastectomy 05/22/2010 for a ypTX , ypN0 (i+)) residual invasive ductal carcinoma, estrogen receptor 3% positive ("faint staining"), progesterone receptor negative, Ki-67 40%, HER-2/neu by CISH again amplified with a ratio 2.58,  4.  The patient had radiation therapy from 07/09/2010 through 08/19/2010.    5.  Status post right simple mastectomy on 10/30/2010 with benign pathology  6.  status post reconstruction  with bilateral transverse rectus abdominis myocutaneous flaps in 02/2011.  7.  The patient declined genetic testing.  8. anastrozole started August 2014  PLAN: Keyly is doing terrific from a breast cancer point of view, now 4 years out from her definitive surgery with no evidence of disease recurrence.  I reviewed her pathology report with her pathologist and we both agree that in the new classification at any rate deposits less than 0.2 mm are classified as "isolated tumor cells" and as pN0(i+). This is a minor  technical point, in a way, but what it really does mean is that she did have a complete pathologic response and therefore her prognosis is better than I thought last time I saw her.  We reviewed the possible benefit of anastrozole in light of this new information, and we both agree that the benefits are likely to be minimal. However, she is getting adequate price, it is not causing her any side effects, and she has a stable bone density. I see no reason why she should not continue and complete five-years as originally planned.  She is going to return to see me in one year. She knows to call for any problems that may develop before her next visit here.    Chauncey Cruel, MD 08/03/2014 1:58 PM

## 2014-11-15 ENCOUNTER — Other Ambulatory Visit: Payer: Self-pay | Admitting: *Deleted

## 2014-11-15 DIAGNOSIS — Z853 Personal history of malignant neoplasm of breast: Secondary | ICD-10-CM

## 2014-11-15 MED ORDER — ANASTROZOLE 1 MG PO TABS: 1.0000 mg | ORAL_TABLET | Freq: Every day | ORAL | Status: DC

## 2015-07-16 ENCOUNTER — Other Ambulatory Visit (HOSPITAL_COMMUNITY): Payer: Self-pay | Admitting: Sports Medicine

## 2015-07-16 DIAGNOSIS — M25562 Pain in left knee: Secondary | ICD-10-CM

## 2015-07-17 ENCOUNTER — Ambulatory Visit (HOSPITAL_COMMUNITY)
Admission: RE | Admit: 2015-07-17 | Discharge: 2015-07-17 | Disposition: A | Discharge status: Home / Self Care | Payer: 59 | Source: Ambulatory Visit | Attending: Sports Medicine | Admitting: Sports Medicine

## 2015-07-17 DIAGNOSIS — X58XXXA Exposure to other specified factors, initial encounter: Secondary | ICD-10-CM | POA: Insufficient documentation

## 2015-07-17 DIAGNOSIS — M7052 Other bursitis of knee, left knee: Secondary | ICD-10-CM | POA: Insufficient documentation

## 2015-07-17 DIAGNOSIS — M25562 Pain in left knee: Secondary | ICD-10-CM

## 2015-07-17 DIAGNOSIS — S83242A Other tear of medial meniscus, current injury, left knee, initial encounter: Secondary | ICD-10-CM | POA: Insufficient documentation

## 2015-07-30 ENCOUNTER — Other Ambulatory Visit (HOSPITAL_COMMUNITY): Payer: Self-pay | Admitting: Orthopedic Surgery

## 2015-07-30 ENCOUNTER — Telehealth: Payer: Self-pay | Admitting: Oncology

## 2015-07-30 NOTE — Telephone Encounter (Signed)
Patient called in to reschedule her labs  Andrea Castro

## 2015-07-31 ENCOUNTER — Telehealth: Payer: Self-pay | Admitting: Oncology

## 2015-07-31 NOTE — Telephone Encounter (Signed)
Patient called in to reschedule her appointments as she is having knee surg

## 2015-08-01 ENCOUNTER — Other Ambulatory Visit: Payer: BC Managed Care – PPO

## 2015-08-03 ENCOUNTER — Other Ambulatory Visit: Payer: 59

## 2015-08-06 NOTE — Pre-Procedure Instructions (Signed)
Andrea Castro  08/06/2015      [facility], [place] [address] Phone: [phone] Fax: [phone]  [facility] [place], [place] [address] Phone: [phone] Fax: [phone]  [facility], [place] [address] [place] [address] Phone: [phone] Fax: [phone]    Your procedure is scheduled on Fri, Oct 28 @ 12:35 PM  Report to [facility] Admitting at 10:30 AM  Call this number if you have problems the morning of surgery:  [phone]   Remember:  Do not eat food or drink liquids after midnight.               Stop taking your Aspirin,Fish Oil along with any Vitamins or Herbal Medications. No Goody's,BC's,or Aleve.   Do not wear jewelry, make-up or nail polish.  Do not wear lotions, powders, or perfumes.  You may wear deodorant.  Do not shave 48 hours prior to surgery.   Do not bring valuables to the hospital.  [facility] is not responsible for any belongings or valuables.  Contacts, dentures or bridgework may not be worn into surgery.  Leave your suitcase in the car.  After surgery it may be brought to your room.  For patients admitted to the hospital, discharge time will be determined by your treatment team.  Patients discharged the day of surgery will not be allowed to drive home.    Special instructions:  [facility] - Preparing for Surgery  Before surgery, you can play an important role.  Because skin is not sterile, your skin needs to be as free of germs as possible.  You can reduce the number of germs on you skin by washing with CHG (chlorahexidine gluconate) soap before surgery.  CHG is an antiseptic cleaner which kills germs and bonds with the skin to continue killing germs even after  washing.  Please DO NOT use if you have an allergy to CHG or antibacterial soaps.  If your skin becomes reddened/irritated stop using the CHG and inform your nurse when you arrive at Short Stay.  Do not shave (including legs and underarms) for at least 48 hours prior to the first CHG shower.  You may shave your face.  Please follow these instructions carefully:   1.  Shower with CHG Soap the night before surgery and the                                morning of Surgery.  2.  If you choose to wash your hair, wash your hair first as usual with your       normal shampoo.  3.  After you shampoo, rinse your hair and body thoroughly to remove the                      Shampoo.  4.  Use CHG as you would any other liquid soap.  You can apply chg directly       to the skin and wash gently with scrungie or a clean washcloth.  5.  Apply the CHG Soap to your body ONLY FROM THE NECK DOWN.        Do not use on open wounds or open sores.  Avoid contact with your eyes,  ears, mouth and genitals (private parts).  Wash genitals (private parts)       with your normal soap.  6.  Wash thoroughly, paying special attention to the area where your surgery        will be performed.  7.  Thoroughly rinse your body with warm water from the neck down.  8.  DO NOT shower/wash with your normal soap after using and rinsing off       the CHG Soap.  9.  Pat yourself dry with a clean towel.            10.  Wear clean pajamas.            11.  Place clean sheets on your bed the night of your first shower and do not        sleep with pets.  Day of Surgery  Do not apply any lotions/deoderants the morning of surgery.  Please wear clean clothes to the hospital/surgery center.    Please read over the following fact sheets that you were given. Pain Booklet, Coughing and Deep Breathing and Surgical Site Infection Prevention

## 2015-08-07 ENCOUNTER — Encounter (HOSPITAL_COMMUNITY): Payer: Self-pay

## 2015-08-07 ENCOUNTER — Encounter (HOSPITAL_COMMUNITY)
Admission: RE | Admit: 2015-08-07 | Discharge: 2015-08-07 | Disposition: A | Discharge status: Home / Self Care | Payer: 59 | Source: Ambulatory Visit | Attending: Orthopedic Surgery | Admitting: Orthopedic Surgery

## 2015-08-07 DIAGNOSIS — X58XXXA Exposure to other specified factors, initial encounter: Secondary | ICD-10-CM | POA: Diagnosis not present

## 2015-08-07 DIAGNOSIS — S83242A Other tear of medial meniscus, current injury, left knee, initial encounter: Secondary | ICD-10-CM | POA: Diagnosis not present

## 2015-08-07 DIAGNOSIS — Z01812 Encounter for preprocedural laboratory examination: Secondary | ICD-10-CM | POA: Diagnosis not present

## 2015-08-07 LAB — COMPREHENSIVE METABOLIC PANEL
ALK PHOS: 91 U/L (ref 38–126)
ALT: 22 U/L (ref 14–54)
ANION GAP: 6 (ref 5–15)
AST: 20 U/L (ref 15–41)
Albumin: 3.7 g/dL (ref 3.5–5.0)
BUN: 19 mg/dL (ref 6–20)
CALCIUM: 10.4 mg/dL — AB (ref 8.9–10.3)
CO2: 26 mmol/L (ref 22–32)
Chloride: 107 mmol/L (ref 101–111)
Creatinine, Ser: 0.74 mg/dL (ref 0.44–1.00)
GFR calc non Af Amer: >60 mL/min (ref >60)
Glucose, Bld: 90 mg/dL (ref 65–99)
POTASSIUM: 4.5 mmol/L (ref 3.5–5.1)
SODIUM: 139 mmol/L (ref 135–145)
TOTAL PROTEIN: 7 g/dL (ref 6.5–8.1)
Total Bilirubin: 0.4 mg/dL (ref 0.3–1.2)

## 2015-08-07 LAB — CBC
HCT: 42 % (ref 36.0–46.0)
HEMOGLOBIN: 14.2 g/dL (ref 12.0–15.0)
MCH: 31.6 pg (ref 26.0–34.0)
MCHC: 33.8 g/dL (ref 30.0–36.0)
MCV: 93.3 fL (ref 78.0–100.0)
Platelets: 193 10*3/uL (ref 150–400)
RBC: 4.5 MIL/uL (ref 3.87–5.11)
RDW: 13 % (ref 11.5–15.5)
WBC: 6.2 10*3/uL (ref 4.0–10.5)

## 2015-08-07 LAB — PROTIME-INR
INR: 1 (ref 0.00–1.49)
Prothrombin Time: 13.5 seconds (ref 11.6–15.2)

## 2015-08-07 LAB — APTT: aPTT: 28 seconds (ref 24–37)

## 2015-08-08 ENCOUNTER — Ambulatory Visit: Payer: BC Managed Care – PPO | Admitting: Oncology

## 2015-08-16 MED ORDER — CHLORHEXIDINE GLUCONATE 4 % EX LIQD: 60.0000 mL | Freq: Once | CUTANEOUS | Status: DC

## 2015-08-16 MED ORDER — CEFAZOLIN SODIUM-DEXTROSE 2-3 GM-% IV SOLR
2.0000 g | INTRAVENOUS | Status: AC
  Administered 2015-08-17: 2 g via INTRAVENOUS
  Filled 2015-08-16: qty 50

## 2015-08-17 ENCOUNTER — Ambulatory Visit (HOSPITAL_COMMUNITY)
Admission: RE | Admit: 2015-08-17 | Discharge: 2015-08-17 | Disposition: A | Discharge status: Home / Self Care | Payer: 59 | Source: Ambulatory Visit | Attending: Orthopedic Surgery | Admitting: Orthopedic Surgery

## 2015-08-17 ENCOUNTER — Ambulatory Visit (HOSPITAL_COMMUNITY): Payer: 59 | Admitting: Anesthesiology

## 2015-08-17 ENCOUNTER — Encounter (HOSPITAL_COMMUNITY)
Admission: RE | Disposition: A | Discharge status: Home / Self Care | Payer: Self-pay | Source: Ambulatory Visit | Attending: Orthopedic Surgery

## 2015-08-17 ENCOUNTER — Encounter (HOSPITAL_COMMUNITY): Payer: Self-pay | Admitting: Anesthesiology

## 2015-08-17 DIAGNOSIS — X58XXXA Exposure to other specified factors, initial encounter: Secondary | ICD-10-CM | POA: Diagnosis not present

## 2015-08-17 DIAGNOSIS — S83207D Unspecified tear of unspecified meniscus, current injury, left knee, subsequent encounter: Secondary | ICD-10-CM

## 2015-08-17 DIAGNOSIS — S83242A Other tear of medial meniscus, current injury, left knee, initial encounter: Secondary | ICD-10-CM | POA: Diagnosis present

## 2015-08-17 DIAGNOSIS — Z853 Personal history of malignant neoplasm of breast: Secondary | ICD-10-CM | POA: Diagnosis not present

## 2015-08-17 DIAGNOSIS — S83282A Other tear of lateral meniscus, current injury, left knee, initial encounter: Secondary | ICD-10-CM | POA: Diagnosis not present

## 2015-08-17 DIAGNOSIS — M179 Osteoarthritis of knee, unspecified: Secondary | ICD-10-CM | POA: Diagnosis not present

## 2015-08-17 DIAGNOSIS — M21962 Unspecified acquired deformity of left lower leg: Secondary | ICD-10-CM | POA: Insufficient documentation

## 2015-08-17 HISTORY — PX: KNEE ARTHROSCOPY: SHX127

## 2015-08-17 SURGERY — ARTHROSCOPY, KNEE
Anesthesia: General | Site: Knee | Laterality: Left

## 2015-08-17 MED ORDER — FENTANYL CITRATE (PF) 250 MCG/5ML IJ SOLN
INTRAMUSCULAR | Status: AC
  Filled 2015-08-17: qty 5

## 2015-08-17 MED ORDER — ASPIRIN EC 81 MG PO TBEC: 81.0000 mg | DELAYED_RELEASE_TABLET | Freq: Every day | ORAL | Status: AC

## 2015-08-17 MED ORDER — ONDANSETRON HCL 4 MG/2ML IJ SOLN
INTRAMUSCULAR | Status: DC | PRN
  Administered 2015-08-17: 4 mg via INTRAVENOUS

## 2015-08-17 MED ORDER — HYDROMORPHONE HCL 1 MG/ML IJ SOLN
0.2500 mg | INTRAMUSCULAR | Status: DC | PRN
  Administered 2015-08-17 (×4): 0.5 mg via INTRAVENOUS

## 2015-08-17 MED ORDER — FENTANYL CITRATE (PF) 100 MCG/2ML IJ SOLN
INTRAMUSCULAR | Status: DC | PRN
Start: 2015-08-17 — End: 2015-08-17
  Administered 2015-08-17: 50 ug via INTRAVENOUS
  Administered 2015-08-17: 100 ug via INTRAVENOUS

## 2015-08-17 MED ORDER — HYDROMORPHONE HCL 1 MG/ML IJ SOLN
INTRAMUSCULAR | Status: AC
  Administered 2015-08-17: 0.5 mg via INTRAVENOUS
  Filled 2015-08-17: qty 1

## 2015-08-17 MED ORDER — PROPOFOL 10 MG/ML IV BOLUS
INTRAVENOUS | Status: AC
  Filled 2015-08-17: qty 20

## 2015-08-17 MED ORDER — OXYCODONE HCL 5 MG PO TABS
5.0000 mg | ORAL_TABLET | Freq: Once | ORAL | Status: AC | PRN
  Administered 2015-08-17: 5 mg via ORAL

## 2015-08-17 MED ORDER — PROPOFOL 10 MG/ML IV BOLUS
INTRAVENOUS | Status: DC | PRN
  Administered 2015-08-17: 20 mg via INTRAVENOUS
  Administered 2015-08-17: 200 mg via INTRAVENOUS

## 2015-08-17 MED ORDER — SODIUM CHLORIDE 0.9 % IR SOLN
Status: DC | PRN
  Administered 2015-08-17: 6000 mL

## 2015-08-17 MED ORDER — OXYCODONE HCL 5 MG/5ML PO SOLN: 5.0000 mg | Freq: Once | ORAL | Status: AC | PRN

## 2015-08-17 MED ORDER — HYDROCODONE-ACETAMINOPHEN 5-325 MG PO TABS: 1.0000 | ORAL_TABLET | Freq: Four times a day (QID) | ORAL | Status: DC | PRN

## 2015-08-17 MED ORDER — MEPERIDINE HCL 25 MG/ML IJ SOLN: 6.2500 mg | INTRAMUSCULAR | Status: DC | PRN

## 2015-08-17 MED ORDER — MIDAZOLAM HCL 2 MG/2ML IJ SOLN
INTRAMUSCULAR | Status: AC
  Filled 2015-08-17: qty 4

## 2015-08-17 MED ORDER — MIDAZOLAM HCL 5 MG/5ML IJ SOLN
INTRAMUSCULAR | Status: DC | PRN
  Administered 2015-08-17: 2 mg via INTRAVENOUS

## 2015-08-17 MED ORDER — LIDOCAINE HCL (CARDIAC) 20 MG/ML IV SOLN
INTRAVENOUS | Status: DC | PRN
  Administered 2015-08-17: 50 mg via INTRAVENOUS

## 2015-08-17 MED ORDER — BUPIVACAINE HCL (PF) 0.25 % IJ SOLN
INTRAMUSCULAR | Status: DC | PRN
  Administered 2015-08-17: 10 mL

## 2015-08-17 MED ORDER — HYDROCODONE-ACETAMINOPHEN 5-325 MG PO TABS
ORAL_TABLET | ORAL | Status: AC
  Administered 2015-08-17: 2
  Filled 2015-08-17: qty 2

## 2015-08-17 MED ORDER — LACTATED RINGERS IV SOLN
INTRAVENOUS | Status: DC | PRN
  Administered 2015-08-17: 13:00:00 via INTRAVENOUS

## 2015-08-17 MED ORDER — BUPIVACAINE HCL (PF) 0.25 % IJ SOLN
INTRAMUSCULAR | Status: AC
  Filled 2015-08-17: qty 30

## 2015-08-17 MED ORDER — OXYCODONE HCL 5 MG PO TABS
ORAL_TABLET | ORAL | Status: AC
  Filled 2015-08-17: qty 1

## 2015-08-17 SURGICAL SUPPLY — 35 items
BLADE CUDA 5.5 (BLADE) IMPLANT
BLADE GREAT WHITE 4.2 (BLADE) IMPLANT
BLADE GREAT WHITE 4.2MM (BLADE)
BNDG COHESIVE 6X5 TAN STRL LF (GAUZE/BANDAGES/DRESSINGS) ×3 IMPLANT
BNDG GAUZE ELAST 4 BULKY (GAUZE/BANDAGES/DRESSINGS) ×3 IMPLANT
BUR OVAL 6.0 (BURR) IMPLANT
COVER SURGICAL LIGHT HANDLE (MISCELLANEOUS) ×6 IMPLANT
CUFF TOURNIQUET SINGLE 34IN LL (TOURNIQUET CUFF) IMPLANT
CUFF TOURNIQUET SINGLE 44IN (TOURNIQUET CUFF) IMPLANT
DRAPE ARTHROSCOPY W/POUCH 114 (DRAPES) ×3 IMPLANT
DRAPE U-SHAPE 47X51 STRL (DRAPES) ×3 IMPLANT
DRSG ADAPTIC 3X8 NADH LF (GAUZE/BANDAGES/DRESSINGS) ×3 IMPLANT
DRSG EMULSION OIL 3X3 NADH (GAUZE/BANDAGES/DRESSINGS) ×3 IMPLANT
DRSG PAD ABDOMINAL 8X10 ST (GAUZE/BANDAGES/DRESSINGS) ×3 IMPLANT
DURAPREP 26ML APPLICATOR (WOUND CARE) ×3 IMPLANT
GAUZE SPONGE 4X4 12PLY STRL (GAUZE/BANDAGES/DRESSINGS) ×3 IMPLANT
GLOVE SURG ORTHO 9.0 STRL STRW (GLOVE) ×3 IMPLANT
GOWN STRL REUS W/ TWL XL LVL3 (GOWN DISPOSABLE) ×3 IMPLANT
GOWN STRL REUS W/TWL XL LVL3 (GOWN DISPOSABLE) ×6
KIT BASIN OR (CUSTOM PROCEDURE TRAY) ×3 IMPLANT
KIT ROOM TURNOVER OR (KITS) ×3 IMPLANT
MANIFOLD NEPTUNE II (INSTRUMENTS) ×3 IMPLANT
NEEDLE 18GX1X1/2 (RX/OR ONLY) (NEEDLE) ×3 IMPLANT
PACK ARTHROSCOPY DSU (CUSTOM PROCEDURE TRAY) ×3 IMPLANT
PAD ARMBOARD 7.5X6 YLW CONV (MISCELLANEOUS) ×6 IMPLANT
PADDING CAST COTTON 6X4 STRL (CAST SUPPLIES) ×3 IMPLANT
SET ARTHROSCOPY TUBING (MISCELLANEOUS) ×2
SET ARTHROSCOPY TUBING LN (MISCELLANEOUS) ×1 IMPLANT
SUT ETHILON 4 0 PS 2 18 (SUTURE) ×3 IMPLANT
SYR 20CC LL (SYRINGE) ×3 IMPLANT
TOWEL OR 17X24 6PK STRL BLUE (TOWEL DISPOSABLE) ×6 IMPLANT
TUBE CONNECTING 12'X1/4 (SUCTIONS) ×1
TUBE CONNECTING 12X1/4 (SUCTIONS) ×2 IMPLANT
WAND HAND CNTRL MULTIVAC 90 (MISCELLANEOUS) ×3 IMPLANT
WATER STERILE IRR 1000ML POUR (IV SOLUTION) ×3 IMPLANT

## 2015-08-17 NOTE — Anesthesia Preprocedure Evaluation (Signed)

## 2015-08-17 NOTE — Transfer of Care (Signed)
Immediate Anesthesia Transfer of Care Note  Patient: Andrea Castro  Procedure(s) Performed: Procedure(s): Left Knee Arthroscopy and Debridement (Left)  Patient Location: PACU  Anesthesia Type:General  Level of Consciousness: awake, alert , oriented, patient cooperative and responds to stimulation  Airway & Oxygen Therapy: Patient Spontanous Breathing and Patient connected to nasal cannula oxygen  Post-op Assessment: Post -op Vital signs reviewed and stable, Patient moving all extremities, Patient moving all extremities X 4 and Patient able to stick tongue midline  Post vital signs: Reviewed and stable  Last Vitals:  Filed Vitals:   08/17/15 1056  BP: 170/96  Pulse: 74  Temp: 36.7 C  Resp: 20    Complications: No apparent anesthesia complications

## 2015-08-17 NOTE — Anesthesia Postprocedure Evaluation (Signed)
  Anesthesia Post-op Note  Patient: Andrea Castro  Procedure(s) Performed: Procedure(s): Left Knee Arthroscopy and Debridement (Left)  Patient Location: PACU  Anesthesia Type: General   Level of Consciousness: awake, alert  and oriented  Airway and Oxygen Therapy: Patient Spontanous Breathing  Post-op Pain: moderate  Post-op Assessment: Post-op Vital signs reviewed  Post-op Vital Signs: Reviewed  Last Vitals:  Filed Vitals:   08/17/15 1354  BP: 131/92  Pulse:   Temp: 36.6 C  Resp: 17    Complications: No apparent anesthesia complications

## 2015-08-17 NOTE — H&P (Signed)
Andrea Castro is an 59 y.o. female.   Chief Complaint: Left knee pain with mechanical catching or locking and giving way. HPI: Patient is a 59 year old woman who presents for arthroscopic debridement left knee. Patient is been having mechanical symptoms with catching and locking giving way she has failed conservative care.  Past Medical History  Diagnosis Date  . History of breast cancer   . Arthritis   . Cancer [facility]) 2011    left breast  . Ruptured disc, thoracic 08/1983  . ACL (anterior cruciate ligament) tear 2002  . Hyphema of right eye     Past Surgical History  Procedure Laterality Date  . Mastectomy  10/30/10    right, total  . Portacath placement  12/17/09  . Back surgery  1984    ruptured disc  . Torn ligament  2002    left knee  . Tubal ligation  2004  . Mastectomy  05/22/10    left cancer/chemo and radiation  . Reconstruction breast w/ tram flap  02/20/11    bilateral  . Hernia repair  02/20/11    ventral hernia  . Port-a-cath removal  01/09/2012    Procedure: MINOR REMOVAL PORT-A-CATH;  Surgeon: Haywood Lasso, MD;  Location: [facility];  Service: General;  Laterality: N/A;  . Nipple construction  08/2011    tattoo at another date  . Knee arthroscopy with anterior cruciate ligament (acl) repair Left 03/2010    Family History  Problem Relation Age of Onset  . COPD Father   . Hypertension Father   . Hypertension Mother   . Parkinson's disease Mother    Social History:  reports that she has never smoked. She has never used smokeless tobacco. She reports that she does not drink alcohol or use illicit drugs.  Allergies: No Known Allergies  No prescriptions prior to admission    No results found for this or any previous visit (from the past 48 hour(s)). No results found.  Review of Systems  All other systems reviewed and are negative.   There were no vitals taken for this visit. Physical Exam  On examination patient has pain to palpation  of the mediolateral joint lines class and cruciates are stable flexion and rotation reproduces pain. Assessment/Plan Assessment: Osteoarthritis with mechanical symptoms left knee.  Plan: Plan for arthroscopic debridement discharge to home risk and benefits were discussed including risk of persistent pain. Patient states she understands and wishes to proceed at this time.  Ruthel Martine V 08/17/2015, 6:24 AM

## 2015-08-17 NOTE — Op Note (Signed)
08/17/2015  1:48 PM  PATIENT:  Andrea Castro    PRE-OPERATIVE DIAGNOSIS:  Medial Meniscal Tear Left Knee  POST-OPERATIVE DIAGNOSIS:  Medial and lateral meniscal tear with osteochondral defect of the medial femoral condyle and medial tibial plateau  PROCEDURE:  Left Knee Arthroscopy and Debridement  SURGEON:  Newt Minion, MD  PHYSICIAN ASSISTANT:None ANESTHESIA:   General  PREOPERATIVE INDICATIONS:  Andrea Castro is a  59 y.o. female with a diagnosis of Medial Meniscal Tear Left Knee who failed conservative measures and elected for surgical management.    The risks benefits and alternatives were discussed with the patient preoperatively including but not limited to the risks of infection, bleeding, nerve injury, cardiopulmonary complications, the need for revision surgery, among others, and the patient was willing to proceed.  OPERATIVE IMPLANTS: None  OPERATIVE FINDINGS: Large osteochondral defect of the medial tibial plateau medial femoral condyle and large horizontal cleavage tear of the medial meniscus and degenerative tearing of the lateral meniscus  OPERATIVE PROCEDURE: Patient was brought to the operating room and underwent a general anesthetic. After adequate levels anesthesia obtained patient's left lower extremity was prepped using DuraPrep draped into a sterile field. A timeout was called. The scope was inserted through the anterior lateral portal and anterior medial portal was established for a working portal. Visualization showed a large os chondral defect medial femoral condyle this is debrided back to bleeding viable subchondral bone with using the shaver. The vapor wand was used for hemostasis. Patient had a large horizontal cleavage tear of the medial meniscus was debrided with a shaver the lateral tibial plateau was also debrided. The anterior cruciate ligament was intact. The lateral joint line was evaluated in the figure 4 position this showed degenerative tearing of  lateral meniscus this was debrided with a shaver. Examination of the patellofemoral joint with the knee extended senna showed patellofemoral arthritic changes this was debrided she also had a large plica which was also debrided. The vapor wand was used for hemostasis. The enhancements removed the portals were closed using 3-0 nylon. A sterile compressive dressing was applied. Patient was extubated taken to the PACU in stable condition. The knee was injected with total 20 mL of quarter percent Marcaine plain.

## 2015-08-17 NOTE — Anesthesia Procedure Notes (Signed)
Procedure Name: LMA Insertion Date/Time: 08/17/2015 1:08 PM Performed by: Vennie Homans Pre-anesthesia Checklist: Patient identified, Emergency Drugs available, Suction available, Patient being monitored and Timeout performed Patient Re-evaluated:Patient Re-evaluated prior to inductionOxygen Delivery Method: Circle system utilized Preoxygenation: Pre-oxygenation with 100% oxygen Intubation Type: IV induction Ventilation: Mask ventilation without difficulty LMA: LMA inserted LMA Size: 4.0 Placement Confirmation: positive ETCO2 and breath sounds checked- equal and bilateral Tube secured with: Tape Dental Injury: Teeth and Oropharynx as per pre-operative assessment

## 2015-08-20 ENCOUNTER — Encounter (HOSPITAL_COMMUNITY): Payer: Self-pay | Admitting: Orthopedic Surgery

## 2015-10-16 ENCOUNTER — Other Ambulatory Visit: Payer: Self-pay | Admitting: *Deleted

## 2015-10-16 DIAGNOSIS — Z853 Personal history of malignant neoplasm of breast: Secondary | ICD-10-CM

## 2015-10-17 ENCOUNTER — Other Ambulatory Visit (HOSPITAL_BASED_OUTPATIENT_CLINIC_OR_DEPARTMENT_OTHER): Payer: 59

## 2015-10-17 DIAGNOSIS — Z853 Personal history of malignant neoplasm of breast: Secondary | ICD-10-CM | POA: Diagnosis not present

## 2015-10-17 LAB — COMPREHENSIVE METABOLIC PANEL
ALBUMIN: 4 g/dL (ref 3.5–5.0)
ALK PHOS: 101 U/L (ref 40–150)
ALT: 23 U/L (ref 0–55)
ANION GAP: 8 meq/L (ref 3–11)
AST: 17 U/L (ref 5–34)
BILIRUBIN TOTAL: 0.53 mg/dL (ref 0.20–1.20)
BUN: 15.8 mg/dL (ref 7.0–26.0)
CALCIUM: 10.2 mg/dL (ref 8.4–10.4)
CHLORIDE: 104 meq/L (ref 98–109)
CO2: 27 mEq/L (ref 22–29)
CREATININE: 0.9 mg/dL (ref 0.6–1.1)
EGFR: 73 mL/min/{1.73_m2} — ABNORMAL LOW (ref >90)
Glucose: 99 mg/dl (ref 70–140)
Potassium: 4.6 mEq/L (ref 3.5–5.1)
Sodium: 139 mEq/L (ref 136–145)
TOTAL PROTEIN: 7.9 g/dL (ref 6.4–8.3)

## 2015-10-17 LAB — CBC WITH DIFFERENTIAL/PLATELET
BASO%: 0.4 % (ref 0.0–2.0)
Basophils Absolute: 0 10*3/uL (ref 0.0–0.1)
EOS%: 2.5 % (ref 0.0–7.0)
Eosinophils Absolute: 0.1 10*3/uL (ref 0.0–0.5)
HEMATOCRIT: 42.5 % (ref 34.8–46.6)
HEMOGLOBIN: 14.4 g/dL (ref 11.6–15.9)
LYMPH%: 27.2 % (ref 14.0–49.7)
MCH: 31.4 pg (ref 25.1–34.0)
MCHC: 33.9 g/dL (ref 31.5–36.0)
MCV: 92.8 fL (ref 79.5–101.0)
MONO#: 0.3 10*3/uL (ref 0.1–0.9)
MONO%: 5.3 % (ref 0.0–14.0)
NEUT#: 3.6 10*3/uL (ref 1.5–6.5)
NEUT%: 64.6 % (ref 38.4–76.8)
PLATELETS: 205 10*3/uL (ref 145–400)
RBC: 4.58 10*6/uL (ref 3.70–5.45)
RDW: 13.1 % (ref 11.2–14.5)
WBC: 5.5 10*3/uL (ref 3.9–10.3)
lymph#: 1.5 10*3/uL (ref 0.9–3.3)

## 2015-10-23 ENCOUNTER — Telehealth: Payer: Self-pay | Admitting: Oncology

## 2015-10-23 ENCOUNTER — Ambulatory Visit (HOSPITAL_BASED_OUTPATIENT_CLINIC_OR_DEPARTMENT_OTHER): Payer: 59 | Admitting: Oncology

## 2015-10-23 VITALS — BP 138/77 | HR 73 | Temp 97.8°F | Resp 18 | Ht 64.25 in | Wt 196.7 lb

## 2015-10-23 DIAGNOSIS — C50412 Malignant neoplasm of upper-outer quadrant of left female breast: Secondary | ICD-10-CM

## 2015-10-23 DIAGNOSIS — C773 Secondary and unspecified malignant neoplasm of axilla and upper limb lymph nodes: Secondary | ICD-10-CM | POA: Diagnosis not present

## 2015-10-23 DIAGNOSIS — M8580 Other specified disorders of bone density and structure, unspecified site: Secondary | ICD-10-CM | POA: Diagnosis not present

## 2015-10-23 NOTE — Telephone Encounter (Signed)
Appointments made and avs printed for patient °

## 2015-10-23 NOTE — Progress Notes (Signed)
[facility]  [phone] [phone]  OFFICE PROGRESS NOTE   ID: Andrea Castro   DOB: 1956-10-10  MR#: [id number]  DGL#:[id number]   PCP: Jani Gravel, M.D.  GYN: Newton Pigg, M.D. SU:  Neldon Mc, M.D. RAD ONC: Thea Silversmith, M.D. PLA SU: Tressa Busman, M.D. ([facility]) OTHER MD: Sherren Mocha McDiarmid M.D. (urol)   CHIEF COMPLAINT: Locally advanced breast cancer  CURRENT TREATMENT: Anastrozole   HISTORY OF PRESENT ILLNESS: From Dr. Collier Salina Rubin's intake note dated 12/12/2009:  "This is a delightful 59 year old woman from [place] referred by Dr. Margot Chimes for evaluation and treatment of breast cancer.  This woman has been in a reasonably good health.  She had noted a cystic abnormality in the left axilla, which she ultimately lanced.  She developed a rash over her breast and tried using Cloderm for this.  She sought some attention at a local dermatology office via the PAs there.  They were concerned about the rash in the actual breast itself.  She was referred to Dr. Ulanda Edison for exam.  He in turn referred her for diagnostic mammogram, left breast ultrasound on 11/29/2009.  Physical exam at that time showed extensive erythema, peau d'orange changes left breast, most prominent in the medial upper left breast, palpable thickening upper left medial breast, palpable firmness in the left axilla.  The mammogram was compared to one performed in 1998.  This showed skin thickening left breast, numerous pleomorphic calcifications upper outer left breast abutting the area of 5.0 x 4.5 x 5.6 cm, enlarged lymph nodes in the left axilla were seen.  An ultrasound-guided biopsy of the mass and calcifications and enlarged lymph node was performed 11/30/2009.  This showed a grade 3/3 invasive ductal cancer, which was ER and PR negative, proliferative index 28%, HER-2 was 2.65.  A lymph node that was biopsied also showed metastatic disease.  The patient is  here for consideration of neoadjuvant therapy."    Her subsequent history is as detailed below.   INTERVAL HISTORY: Andrea Castro returns today for followup of her estrogen receptor positive breast cancer. She continues on anastrozole. Generally she does well with this, although she does have some hot flashes. She is not interested in any intervention for that. She also has some vaginal dryness issues, which she treats usually with K-Y jelly. She [facility] a drug [facility]  REVIEW OF SYSTEMS: Andrea Castro has been spending quite a bit of time driving her husband dear Drs.offices. He retired in November and promptly had a significant decline in his health. He is now able to drive again and that has been very helpful. Because of this and other family issues she has not had any chance to exercise. She did recently get a dog and she is walking the dog on a regular basis. A detailed review of systems today was otherwise stable  PAST MEDICAL HISTORY: Past Medical History  Diagnosis Date  . History of breast cancer   . Arthritis   . Cancer [facility]) 2011    left breast  . Ruptured disc, thoracic 08/1983  . ACL (anterior cruciate ligament) tear 2002  . Hyphema of right eye     PAST SURGICAL HISTORY: Past Surgical History  Procedure Laterality Date  . Mastectomy  10/30/10    right, total  . Portacath placement  12/17/09  . Back surgery  1984    ruptured disc  . Torn ligament  2002    left knee  .  Tubal ligation  2004  . Mastectomy  05/22/10    left cancer/chemo and radiation  . Reconstruction breast w/ tram flap  02/20/11    bilateral  . Hernia repair  02/20/11    ventral hernia  . Port-a-cath removal  01/09/2012    Procedure: MINOR REMOVAL PORT-A-CATH;  Surgeon: Haywood Lasso, MD;  Location: [facility];  Service: General;  Laterality: N/A;  . Nipple construction  08/2011    tattoo at another date  . Knee arthroscopy with anterior cruciate ligament (acl)  repair Left 03/2010  . Knee arthroscopy Left 08/17/2015    Procedure: Left Knee Arthroscopy and Debridement;  Surgeon: Newt Minion, MD;  Location: [facility];  Service: Orthopedics;  Laterality: Left;    FAMILY HISTORY Family History  Problem Relation Age of Onset  . COPD Father   . Hypertension Father   . Hypertension Mother   . Parkinson's disease Mother    There is no history of breast or ovarian cancer in the family.  GYNECOLOGIC HISTORY: G5, P3, 2 miscarriages.  Menarche age 42.  Age of parity 66.  Her last menstrual period was in 11/2009.  She did have a previous uterine biopsy.  She used birth control pills from the ages of 50 through 4.   SOCIAL HISTORY: (Updated December 2014 Mr. and Mrs. Andrea Castro were married in 1979.  Her husband Andrea Castro has worked at  [employer] since 1972.  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  The patient and her husband are originally from [place].  They have 3 children - a daughter aged 59 and sons aged 16 and 80. All are doing great in school.    In her spare time she enjoys spending time with her family, walking/running, and being outdoors.   ADVANCED DIRECTIVES: Not on file  HEALTH MAINTENANCE: Social History  Substance Use Topics  . Smoking status: Never Smoker   . Smokeless tobacco: Never Used  . Alcohol Use: No    Colonoscopy: 09/24/2012 PAP: Not on file Bone density:  The patient's last bone density scan on 06/22/2012 showed a T score of 1.7 (normal). Lipid panel:  Not on file  No Known Allergies  Current Outpatient Prescriptions  Medication Sig Dispense Refill  . anastrozole (ARIMIDEX) 1 MG tablet Take 1 tablet (1 mg total) by mouth daily. 90 tablet 3  . aspirin 325 MG tablet Take 325 mg by mouth daily.    Marland Kitchen aspirin EC 81 MG tablet Take 1 tablet (81 mg total) by mouth daily. 30 tablet 1  . b complex vitamins tablet Take 1 tablet by mouth daily.    . Biotin 2500 MCG CAPS Take 5,000 mcg by mouth  daily.     . calcium-vitamin D (OSCAL WITH D) 500-200 MG-UNIT per tablet Take 2 tablets by mouth.    . ferrous sulfate 325 (65 FE) MG tablet Take 325 mg by mouth daily with breakfast.    . fish oil-omega-3 fatty acids 1000 MG capsule Take 1 g by mouth 2 (two) times daily.     . Glucosamine HCl 1000 MG TABS Take 1,000 mg by mouth 2 (two) times daily.     Marland Kitchen HYDROcodone-acetaminophen (NORCO) 5-325 MG tablet Take 1 tablet by mouth every 6 (six) hours as needed. 60 tablet 0  . ibuprofen (ADVIL,MOTRIN) 200 MG tablet Take 400 mg by mouth every 8 (eight) hours as needed for mild pain or moderate pain.    Marland Kitchen  loratadine (CLARITIN) 10 MG tablet Take 10 mg by mouth daily as needed for allergies.    . Multiple Vitamin (MULTIVITAMIN PO) Take by mouth daily.      . Probiotic Product (PROBIOTIC DAILY PO) Take 1 capsule by mouth daily.    Marland Kitchen zinc gluconate 50 MG tablet Take 50 mg by mouth daily.     No current facility-administered medications for this visit.    OBJECTIVE: Middle-aged white womanappears well Filed Vitals:   10/23/15 1014  BP: 138/77  Pulse: 73  Temp: 97.8 F (36.6 C)  Resp: 18     Body mass index is 33.5 kg/(m^2).      ECOG FS: 0 - Asymptomatic  Sclerae unicteric, pupils round and equal Oropharynx clear and moist-- no thrush or other lesions No cervical or supraclavicular adenopathy Lungs no rales or rhonchi Heart regular rate and rhythm Abd soft, nontender, positive bowel sounds MSK no focal spinal tenderness, no upper extremity lymphedema Neuro: nonfocal, well oriented, appropriate affect Breasts: status post bilateral mastectomies with bilateral reconstruction. The cosmetic result is good. There is no evidence of local recurrence. Both axillae are benign.     LAB RESULTS: Lab Results  Component Value Date   WBC 5.5 10/17/2015   NEUTROABS 3.6 10/17/2015   HGB 14.4 10/17/2015   HCT 42.5 10/17/2015   MCV 92.8 10/17/2015   PLT 205 10/17/2015      Chemistry        Component Value Date/Time   NA 139 10/17/2015 0816   NA 139 08/07/2015 0842   K 4.6 10/17/2015 0816   K 4.5 08/07/2015 0842   CL 107 08/07/2015 0842   CL 105 08/05/2012 1355   CO2 27 10/17/2015 0816   CO2 26 08/07/2015 0842   BUN 15.8 10/17/2015 0816   BUN 19 08/07/2015 0842   CREATININE 0.9 10/17/2015 0816   CREATININE 0.74 08/07/2015 0842      Component Value Date/Time   CALCIUM 10.2 10/17/2015 0816   CALCIUM 10.4* 08/07/2015 0842   ALKPHOS 101 10/17/2015 0816   ALKPHOS 91 08/07/2015 0842   AST 17 10/17/2015 0816   AST 20 08/07/2015 0842   ALT 23 10/17/2015 0816   ALT 22 08/07/2015 0842   BILITOT 0.53 10/17/2015 0816   BILITOT 0.4 08/07/2015 0842      Lab Results  Component Value Date   LABCA2 50* 08/05/2012    Urinalysis    Component Value Date/Time   COLORURINE YELLOW 10/25/2010 1028   APPEARANCEUR CLEAR 10/25/2010 1028   LABSPEC 1.010 10/25/2010 1028   PHURINE 5.5 10/25/2010 1028   GLUCOSEU NEGATIVE 05/20/2010 1300   HGBUR TRACE* 10/25/2010 1028   BILIRUBINUR NEGATIVE 10/25/2010 1028   KETONESUR NEGATIVE 10/25/2010 1028   PROTEINUR NEGATIVE 10/25/2010 1028   UROBILINOGEN 0.2 10/25/2010 1028   NITRITE NEGATIVE 10/25/2010 1028   LEUKOCYTESUR NEGATIVE 10/25/2010 1028    STUDIES  :No results found.   ASSESSMENT: 60 y.o. Andrea Castro woman:  1.  The patient had a left breast upper outer quadrant and left axillary needle core biopsy on 11/30/2009, both positive for a clinical T3 N1-2, stage IIIA invasive ductal carcinoma, estrogen receptor negative, progesterone receptor negative, Ki-67 40%, HER-2/neu amplified by CISH with a ratio of 2.65.  2.  received neoadjuvant chemotherapy with docetaxel, carboplatin and trastuzumab x 6 cycles from 12/28/2009 through 04/19/2010 with Neulasta support.  Trastuzumab was continued to complete one year (2tol 12/27/2010).  3.  Status post left modified radical mastectomy 05/22/2010 for a ypTX ,  ypN0 (i+)) residual invasive  ductal carcinoma, estrogen receptor 3% positive ("faint staining"), progesterone receptor negative, Ki-67 40%, HER-2/neu by CISH again amplified with a ratio 2.58,  4.  The patient had radiation therapy from 07/09/2010 through 08/19/2010.    5.  Status post right simple mastectomy on 10/30/2010 with benign pathology  6.  status post reconstruction  with bilateral transverse rectus abdominis myocutaneous flaps in 02/2011.  7.  The patient declined genetic testing.  8. anastrozole started August 2014  PLAN: Chelesa is now a little more than 5 years out from her definitive surgery with no evidence of disease recurrence. This is very favorable.  She continues on anastrozole, with good tolerance. She does need to update her bone density, which last showed osteopenia. If there has been a significant decline we can consider Denosumab.  I gave her information on the "intimacy and pelvic health" program and encouraged her to optimize her exercise routine.  She will return to see me again in October, and we will continue to follow her on a yearly basis until she completes her 5 years on anastrozole.    Chauncey Cruel, MD 10/23/2015 10:57 AM

## 2016-03-03 ENCOUNTER — Other Ambulatory Visit: Payer: Self-pay | Admitting: *Deleted

## 2016-03-03 DIAGNOSIS — Z853 Personal history of malignant neoplasm of breast: Secondary | ICD-10-CM

## 2016-03-03 MED ORDER — ANASTROZOLE 1 MG PO TABS: 1.0000 mg | ORAL_TABLET | Freq: Every day | ORAL | Status: DC

## 2016-03-03 MED FILL — ANASTROZOLE 1 MG TABLET: 1 | 90 days supply | Qty: 90 | Fill #0

## 2016-06-30 ENCOUNTER — Other Ambulatory Visit: Payer: 59

## 2016-07-18 ENCOUNTER — Telehealth: Payer: Self-pay | Admitting: Oncology

## 2016-07-18 NOTE — Telephone Encounter (Signed)
10/03 appointment rescheduled per patient request to 10/31. Patient called to reschedule appointment following the week of 10/03, patient was scheduled for the next available.

## 2016-07-22 ENCOUNTER — Ambulatory Visit: Payer: 59 | Admitting: Oncology

## 2016-07-25 ENCOUNTER — Ambulatory Visit
Admission: RE | Admit: 2016-07-25 | Discharge: 2016-07-25 | Disposition: A | Discharge status: Home / Self Care | Payer: 59 | Source: Ambulatory Visit | Attending: Oncology | Admitting: Oncology

## 2016-07-25 DIAGNOSIS — Z13820 Encounter for screening for osteoporosis: Secondary | ICD-10-CM | POA: Diagnosis not present

## 2016-07-25 DIAGNOSIS — Z780 Asymptomatic menopausal state: Secondary | ICD-10-CM | POA: Diagnosis not present

## 2016-07-25 DIAGNOSIS — M8580 Other specified disorders of bone density and structure, unspecified site: Secondary | ICD-10-CM

## 2016-07-25 MED FILL — ANASTROZOLE 1 MG TABLET: 1 | 90 days supply | Qty: 90 | Fill #1

## 2016-08-19 ENCOUNTER — Ambulatory Visit (HOSPITAL_BASED_OUTPATIENT_CLINIC_OR_DEPARTMENT_OTHER): Payer: 59 | Admitting: Oncology

## 2016-08-19 DIAGNOSIS — C773 Secondary and unspecified malignant neoplasm of axilla and upper limb lymph nodes: Secondary | ICD-10-CM | POA: Diagnosis not present

## 2016-08-19 DIAGNOSIS — Z853 Personal history of malignant neoplasm of breast: Secondary | ICD-10-CM

## 2016-08-19 DIAGNOSIS — C50412 Malignant neoplasm of upper-outer quadrant of left female breast: Secondary | ICD-10-CM

## 2016-08-19 MED ORDER — ANASTROZOLE 1 MG PO TABS: 1.0000 mg | ORAL_TABLET | Freq: Every day | ORAL | 3 refills | Status: DC

## 2016-08-19 NOTE — Progress Notes (Signed)
[place]  [phone] [phone]  OFFICE PROGRESS NOTE   ID: Andrea Castro   DOB: 25-Jul-1956  MR#: [id number]  DGL#:[id number]   PCP: Jani Gravel, M.D.  GYN: Newton Pigg, M.D. SU:  Neldon Mc, M.D. RAD ONC: Thea Silversmith, M.D. PLA SU: Tressa Busman, M.D. ([facility]) OTHER MD: Sherren Mocha McDiarmid M.D. (urol)   CHIEF COMPLAINT: Locally advanced breast cancer  CURRENT TREATMENT: Anastrozole   HISTORY OF PRESENT ILLNESS: From Dr. Collier Salina Rubin's intake note dated 12/12/2009:  "This is a delightful 60 year old woman from [place] referred by Dr. Margot Chimes for evaluation and treatment of breast cancer.  This woman has been in a reasonably good health.  She had noted a cystic abnormality in the left axilla, which she ultimately lanced.  She developed a rash over her breast and tried using Cloderm for this.  She sought some attention at a local dermatology office via the PAs there.  They were concerned about the rash in the actual breast itself.  She was referred to Dr. Ulanda Edison for exam.  He in turn referred her for diagnostic mammogram, left breast ultrasound on 11/29/2009.  Physical exam at that time showed extensive erythema, peau d'orange changes left breast, most prominent in the medial upper left breast, palpable thickening upper left medial breast, palpable firmness in the left axilla.  The mammogram was compared to one performed in 1998.  This showed skin thickening left breast, numerous pleomorphic calcifications upper outer left breast abutting the area of 5.0 x 4.5 x 5.6 cm, enlarged lymph nodes in the left axilla were seen.  An ultrasound-guided biopsy of the mass and calcifications and enlarged lymph node was performed 11/30/2009.  This showed a grade 3/3 invasive ductal cancer, which was ER and PR negative, proliferative index 28%, HER-2 was 2.65.  A lymph node that was biopsied also showed metastatic disease.  The patient is  here for consideration of neoadjuvant therapy."    Her subsequent history is as detailed below.   INTERVAL HISTORY: Andrea Castro returns today for followup of her breast cancer. Interval history is generally unremarkable. She tolerates tamoxifen well.Hot flashes and vaginal dryness are not a major issue. She never developed the arthralgias or myalgias that many patients can experience on this medication. She obtains it at a good price.   REVIEW OF SYSTEMS: Andrea Castro wants her job in her family is doing well except of course for her husband's mild cognitive dysfunction. She complains of a dry cough at times. There is no pleurisy, hemoptysis, purulence, or shortness of breath. She is not exercising regularly. A detailed review of systems today was otherwise stable.   PAST MEDICAL HISTORY: Past Medical History:  Diagnosis Date  . ACL (anterior cruciate ligament) tear 2002  . Arthritis   . Cancer [facility]) 2011   left breast  . History of breast cancer   . Hyphema of right eye   . Ruptured disc, thoracic 08/1983    PAST SURGICAL HISTORY: Past Surgical History:  Procedure Laterality Date  . BACK SURGERY  1984   ruptured disc  . HERNIA REPAIR  02/20/11   ventral hernia  . KNEE ARTHROSCOPY Left 08/17/2015   Procedure: Left Knee Arthroscopy and Debridement;  Surgeon: Newt Minion, MD;  Location: [place];  Service: Orthopedics;  Laterality: Left;  . KNEE ARTHROSCOPY WITH ANTERIOR CRUCIATE LIGAMENT (ACL) REPAIR Left 03/2010  . MASTECTOMY  10/30/10   right, total  . MASTECTOMY  05/22/10   left cancer/chemo and  radiation  . nipple construction  08/2011   tattoo at another date  . PORT-A-CATH REMOVAL  01/09/2012   Procedure: MINOR REMOVAL PORT-A-CATH;  Surgeon: Haywood Lasso, MD;  Location: [place];  Service: General;  Laterality: N/A;  . PORTACATH PLACEMENT  12/17/09  . RECONSTRUCTION BREAST W/ TRAM FLAP  02/20/11   bilateral  . torn ligament  2002   left knee  . TUBAL LIGATION  2004      FAMILY HISTORY Family History  Problem Relation Age of Onset  . COPD Father   . Hypertension Father   . Hypertension Mother   . Parkinson's disease Mother    There is no history of breast or ovarian cancer in the family.  GYNECOLOGIC HISTORY: G5, P3, 2 miscarriages.  Menarche age 58.  Age of parity 4.  Her last menstrual period was in 11/2009.  She did have a previous uterine biopsy.  She used birth control pills from the ages of 22 through 76.   SOCIAL HISTORY: (Updated December 2014 Mr. and Mrs. Halle were married in 1979.  Her husband Jerrye Beavers has worked at  [employer] since 1972. 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  The patient and her husband are originally from [place].  They have 3 children - a daughter aged 59 and sons aged 108 and 48. All are doing great in school.    In her spare time she enjoys spending time with her family, walking/running, and being outdoors.   ADVANCED DIRECTIVES: Not on file  HEALTH MAINTENANCE: Social History  Substance Use Topics  . Smoking status: Never Smoker  . Smokeless tobacco: Never Used  . Alcohol use No    Colonoscopy: 09/24/2012 PAP: Not on file Bone density:  The patient's last bone density scan on10/03/2016 showed a T score of 0.0 (normal). Lipid panel:  Not on file  No Known Allergies  Current Outpatient Prescriptions  Medication Sig Dispense Refill  . anastrozole (ARIMIDEX) 1 MG tablet Take 1 tablet (1 mg total) by mouth daily. 90 tablet 3  . aspirin EC 81 MG tablet Take 1 tablet (81 mg total) by mouth daily. 30 tablet 1  . b complex vitamins tablet Take 1 tablet by mouth daily.    . Biotin 2500 MCG CAPS Take 5,000 mcg by mouth daily.     . calcium-vitamin D (OSCAL WITH D) 500-200 MG-UNIT per tablet Take 2 tablets by mouth.    . ferrous sulfate 325 (65 FE) MG tablet Take 325 mg by mouth daily with breakfast.    . fish oil-omega-3 fatty acids 1000 MG capsule Take 1 g by mouth 2  (two) times daily.     . Glucosamine HCl 1000 MG TABS Take 1,000 mg by mouth 2 (two) times daily.     Marland Kitchen ibuprofen (ADVIL,MOTRIN) 200 MG tablet Take 400 mg by mouth every 8 (eight) hours as needed for mild pain or moderate pain.    Marland Kitchen loratadine (CLARITIN) 10 MG tablet Take 10 mg by mouth daily as needed for allergies.    . Multiple Vitamin (MULTIVITAMIN PO) Take by mouth daily.      . Probiotic Product (PROBIOTIC DAILY PO) Take 1 capsule by mouth daily.    Marland Kitchen zinc gluconate 50 MG tablet Take 50 mg by mouth daily.     No current facility-administered medications for this visit.     OBJECTIVE: Middle-aged white woman in no acute distress Vitals:   08/19/16  1024  BP: (!) 144/89  Pulse: 69  Resp: 17  Temp: 98.2 F (36.8 C)     Body mass index is 31.51 kg/m.      ECOG FS: 0 - Asymptomatic  Sclerae unicteric, EOMs intact Oropharynx clear and moist No cervical or supraclavicular adenopathy Lungs no rales or rhonchi Heart regular rate and rhythm Abd soft, nontender, positive bowel sounds MSK no focal spinal tenderness, no upper extremity lymphedema Neuro: nonfocal, well oriented, appropriate affect Breasts: Status post bilateral mastectomies with bilateral reconstruction. Cosmesis of the reconstructed breasts is good. Both axillae are benign.  LAB RESULTS: Lab Results  Component Value Date   WBC 5.5 10/17/2015   NEUTROABS 3.6 10/17/2015   HGB 14.4 10/17/2015   HCT 42.5 10/17/2015   MCV 92.8 10/17/2015   PLT 205 10/17/2015      Chemistry      Component Value Date/Time   NA 139 10/17/2015 0816   K 4.6 10/17/2015 0816   CL 107 08/07/2015 0842   CL 105 08/05/2012 1355   CO2 27 10/17/2015 0816   BUN 15.8 10/17/2015 0816   CREATININE 0.9 10/17/2015 0816      Component Value Date/Time   CALCIUM 10.2 10/17/2015 0816   ALKPHOS 101 10/17/2015 0816   AST 17 10/17/2015 0816   ALT 23 10/17/2015 0816   BILITOT 0.53 10/17/2015 0816      Lab Results  Component Value Date    LABCA2 50 (H) 08/05/2012    Urinalysis    Component Value Date/Time   COLORURINE YELLOW 10/25/2010 1028   APPEARANCEUR CLEAR 10/25/2010 1028   LABSPEC 1.010 10/25/2010 1028   PHURINE 5.5 10/25/2010 1028   GLUCOSEU NEGATIVE 05/20/2010 1300   HGBUR TRACE (A) 10/25/2010 1028   BILIRUBINUR NEGATIVE 10/25/2010 1028   KETONESUR NEGATIVE 10/25/2010 1028   PROTEINUR NEGATIVE 10/25/2010 1028   UROBILINOGEN 0.2 10/25/2010 1028   NITRITE NEGATIVE 10/25/2010 1028   LEUKOCYTESUR NEGATIVE 10/25/2010 1028    STUDIES  :Dg Bone Density  Result Date: 07/25/2016 EXAM: DUAL X-RAY ABSORPTIOMETRY (DXA) FOR BONE MINERAL DENSITY IMPRESSION: Referring Physician:  Chauncey Cruel PATIENT: Name: Andrea Castro, Andrea Castro Patient ID: [id number] Birth Date: Mar 12, 1956 Height: 63.5 in. Sex: Female Measured: 07/25/2016 Weight: 184.0 lbs. Indications: Breast Cancer History, Caucasian, Estrogen Deficient, High risk medication use, History of Osteopenia, Postmenopausal Fractures: None Treatments: Calcium (E943.0), Multivitamin, Vitamin D (E933.5) ASSESSMENT: The BMD measured at Femur Neck Left is 1.042 g/cm2 with a T-score of 0.0. This patient is considered normal according to [facility]) criteria. L-4 was excluded due to degenerative changes. There has been a statistically significant decrease in BMD of the left hip since prior exam dated 06/27/2014. There has been no statistically significant change in BMD of the lumbar spine since prior exam dated 06/27/2014. Patient does not meet criteria for FRAX assessment. Site Region Measured Date Measured Age YA BMD Significant CHANGE T-score DualFemur Neck Left 07/25/2016    [age]         0.0     1.042 g/cm2 * AP Spine  L1-L3     07/25/2016    [age]         2.0     1.430 g/cm2 World Health Organization [facility]) criteria for post-menopausal, Caucasian Women: Normal       T-score at or above -1 SD Osteopenia   T-score between -1 and -2.5 SD Osteoporosis T-score at or below  -2.5 SD RECOMMENDATION: [facility] recommends that FDA-approved medical therapies be considered  in postmenopausal women and men age 64 or older with a: 1. Hip or vertebral (clinical or morphometric) fracture. 2. T-score of <-2.5 at the spine or hip. 3. Ten-year fracture probability by FRAX of 3% or greater for hip fracture or 20% or greater for major osteoporotic fracture. All treatment decisions require clinical judgment and consideration of individual patient factors, including patient preferences, co-morbidities, previous drug use, risk factors not captured in the FRAX model (e.g. falls, vitamin D deficiency, increased bone turnover, interval significant decline in bone density) and possible under - or over-estimation of fracture risk by FRAX. All patients should ensure an adequate intake of dietary calcium (1200 mg/d) and vitamin D (800 IU daily) unless contraindicated. FOLLOW-UP: People with diagnosed cases of osteoporosis or at high risk for fracture should have regular bone mineral density tests. For patients eligible for Medicare, routine testing is allowed once every 2 years. The testing frequency can be increased to one year for patients who have rapidly progressing disease, those who are receiving or discontinuing medical therapy to restore bone mass, or have additional risk factors. I have reviewed this report, and agree with the above findings. [facility] Radiology Electronically Signed   By: Lajean Manes M.D.   On: 07/25/2016 08:59     ASSESSMENT: 60 y.o. [place] woman:  1.  The patient had a left breast upper outer quadrant and left axillary needle core biopsy on 11/30/2009, both positive for a clinical T3 N1-2, stage IIIA invasive ductal carcinoma, estrogen receptor negative, progesterone receptor negative, Ki-67 40%, HER-2/neu amplified by CISH with a ratio of 2.65.  2.  received neoadjuvant chemotherapy with docetaxel, carboplatin and trastuzumab x 6 cycles from  12/28/2009 through 04/19/2010 with Neulasta support.  Trastuzumab was continued to complete one year (2tol 12/27/2010).  3.  Status post left modified radical mastectomy 05/22/2010 for a ypTX , ypN0 (i+)) residual invasive ductal carcinoma, estrogen receptor 3% positive ("faint staining"), progesterone receptor negative, Ki-67 40%, HER-2/neu by CISH again amplified with a ratio 2.58,  4.  The patient had radiation therapy from 07/09/2010 through 08/19/2010.    5.  Status post right simple mastectomy on 10/30/2010 with benign pathology  6.  status post reconstruction  with bilateral transverse rectus abdominis myocutaneous flaps in 02/2011.  7.  The patient declined genetic testing.  8. anastrozole started August 2014  (a) bone density at the [facility] 07/25/2016 shows a T score of 0.00 (normal).  PLAN: Seniah is now 5 years out from definitive surgery for her breast cancer with no evidence of disease recurrence. This is very favorable area  She is tolerating anastrozole well, with the normal bone density. The plan is to continue that for a total of 5 years.  I will see her again in one year, and at that time she will likely "graduate" from follow-up here  She knows to call for any problems that may develop before the next visit.   Chauncey Cruel, MD 08/19/2016 5:24 PM

## 2017-01-12 MED FILL — ANASTROZOLE 1 MG TABLET: 1 | 90 days supply | Qty: 90 | Fill #2

## 2017-04-28 DIAGNOSIS — H5213 Myopia, bilateral: Secondary | ICD-10-CM | POA: Diagnosis not present

## 2017-08-10 ENCOUNTER — Other Ambulatory Visit: Payer: Self-pay

## 2017-08-10 DIAGNOSIS — C50412 Malignant neoplasm of upper-outer quadrant of left female breast: Secondary | ICD-10-CM

## 2017-08-11 ENCOUNTER — Other Ambulatory Visit (HOSPITAL_BASED_OUTPATIENT_CLINIC_OR_DEPARTMENT_OTHER): Payer: 59

## 2017-08-11 DIAGNOSIS — C50412 Malignant neoplasm of upper-outer quadrant of left female breast: Secondary | ICD-10-CM | POA: Diagnosis not present

## 2017-08-11 DIAGNOSIS — C773 Secondary and unspecified malignant neoplasm of axilla and upper limb lymph nodes: Secondary | ICD-10-CM | POA: Diagnosis not present

## 2017-08-11 LAB — COMPREHENSIVE METABOLIC PANEL
ALT: 16 U/L (ref 0–55)
AST: 15 U/L (ref 5–34)
Albumin: 3.7 g/dL (ref 3.5–5.0)
Alkaline Phosphatase: 84 U/L (ref 40–150)
Anion Gap: 10 mEq/L (ref 3–11)
BUN: 13.3 mg/dL (ref 7.0–26.0)
CALCIUM: 9.8 mg/dL (ref 8.4–10.4)
CHLORIDE: 107 meq/L (ref 98–109)
CO2: 25 mEq/L (ref 22–29)
CREATININE: 0.9 mg/dL (ref 0.6–1.1)
EGFR: >60 mL/min/{1.73_m2} (ref >60)
Glucose: 98 mg/dl (ref 70–140)
Potassium: 4.1 mEq/L (ref 3.5–5.1)
Sodium: 141 mEq/L (ref 136–145)
Total Bilirubin: 0.57 mg/dL (ref 0.20–1.20)
Total Protein: 7.3 g/dL (ref 6.4–8.3)

## 2017-08-11 LAB — CBC WITH DIFFERENTIAL/PLATELET
BASO%: 0.2 % (ref 0.0–2.0)
Basophils Absolute: 0 10*3/uL (ref 0.0–0.1)
EOS%: 2.8 % (ref 0.0–7.0)
Eosinophils Absolute: 0.1 10*3/uL (ref 0.0–0.5)
HEMATOCRIT: 40 % (ref 34.8–46.6)
HGB: 13.2 g/dL (ref 11.6–15.9)
LYMPH#: 1.1 10*3/uL (ref 0.9–3.3)
LYMPH%: 26.1 % (ref 14.0–49.7)
MCH: 31 pg (ref 25.1–34.0)
MCHC: 33 g/dL (ref 31.5–36.0)
MCV: 93.9 fL (ref 79.5–101.0)
MONO#: 0.3 10*3/uL (ref 0.1–0.9)
MONO%: 7.9 % (ref 0.0–14.0)
NEUT#: 2.7 10*3/uL (ref 1.5–6.5)
NEUT%: 63 % (ref 38.4–76.8)
Platelets: 210 10*3/uL (ref 145–400)
RBC: 4.26 10*6/uL (ref 3.70–5.45)
RDW: 14.9 % — ABNORMAL HIGH (ref 11.2–14.5)
WBC: 4.3 10*3/uL (ref 3.9–10.3)

## 2017-08-11 MED FILL — ANASTROZOLE 1 MG TABLET: 1 | 90 days supply | Qty: 90 | Fill #0

## 2017-08-11 NOTE — Progress Notes (Signed)
[place]  Telephone:(336) [phone] Fax:(336) [phone]  OFFICE PROGRESS NOTE   ID: Andrea Castro   DOB: September 15, 1956  MR#: [id number]  MBW#:[id number]   PCP: Jani Gravel, M.D.  GYN: Newton Pigg, M.D. SU:  Neldon Mc, M.D. RAD ONC: Thea Silversmith, M.D. PLA SU: Tressa Busman, M.D. ([facility]) OTHER Andrea Castro: Sherren Mocha McDiarmid M.D. (urol)   CHIEF COMPLAINT: Locally advanced breast cancer  CURRENT TREATMENT: completing five years of Anastrozole   HISTORY OF PRESENT ILLNESS: From Dr. Collier Salina Rubin's intake note dated 12/12/2009:  "This is a delightful 61 year old Castro from [place] referred by Dr. Margot Chimes for evaluation and treatment of breast cancer.  This Castro has been in a reasonably good health.  She had noted a cystic abnormality in the left axilla, which she ultimately lanced.  She developed a rash over her breast and tried using Cloderm for this.  She sought some attention at a local dermatology office via the PAs there.  They were concerned about the rash in the actual breast itself.  She was referred to Dr. Ulanda Edison for exam.  He in turn referred her for diagnostic mammogram, left breast ultrasound on 11/29/2009.  Physical exam at that time showed extensive erythema, peau d'orange changes left breast, most prominent in the medial upper left breast, palpable thickening upper left medial breast, palpable firmness in the left axilla.  The mammogram was compared to one performed in 1998.  This showed skin thickening left breast, numerous pleomorphic calcifications upper outer left breast abutting the area of 5.0 x 4.5 x 5.6 cm, enlarged lymph nodes in the left axilla were seen.  An ultrasound-guided biopsy of the mass and calcifications and enlarged lymph node was performed 11/30/2009.  This showed a grade 3/3 invasive ductal cancer, which was ER and PR negative, proliferative index 28%, HER-2 was 2.65.  A lymph node that was biopsied also showed metastatic  disease.  The patient is here for consideration of neoadjuvant therapy."    Her subsequent history is as detailed below.   INTERVAL HISTORY: Andrea Castro returns today for follow-up on treatment of her estrogen receptor positive breast cancer.  She continues on anastrozole, with good tolerance.  Last bone density 07/26/2015 was normal   REVIEW OF SYSTEMS: Andrea Castro is doing well. She does endorse some financial stress. She has a daughter in college who is hoping to go to medical school and her son will be graduating high school. Her daughter is thinking of joining the [employer]. Her son would like to be a Academic librarian. Pt is walking a couple of miles a day and started watching her carbohydrates. She denies unusual headaches, visual changes, nausea, vomiting, or dizziness. There has been no unusual cough, phlegm production, or pleurisy. This been no change in bowel or bladder habits. She denies unexplained fatigue or unexplained weight loss, bleeding, rash, or fever. A detailed review of systems was otherwise entirely stable.   PAST MEDICAL HISTORY: Past Medical History:  Diagnosis Date  . ACL (anterior cruciate ligament) tear 2002  . Arthritis   . Cancer [facility]) 2011   left breast  . History of breast cancer   . Hyphema of right eye   . Ruptured disc, thoracic 08/1983    PAST SURGICAL HISTORY: Past Surgical History:  Procedure Laterality Date  . BACK SURGERY  1984   ruptured disc  . HERNIA REPAIR  02/20/11   ventral hernia  . KNEE ARTHROSCOPY Left 08/17/2015   Procedure: Left Knee Arthroscopy and Debridement;  Surgeon: Newt Minion, Andrea Castro;  Location: [place];  Service: Orthopedics;  Laterality: Left;  . KNEE ARTHROSCOPY WITH ANTERIOR CRUCIATE LIGAMENT (ACL) REPAIR Left 03/2010  . MASTECTOMY  10/30/10   right, total  . MASTECTOMY  05/22/10   left cancer/chemo and radiation  . nipple construction  08/2011   tattoo at another date  . PORT-A-CATH REMOVAL  01/09/2012   Procedure: MINOR REMOVAL  PORT-A-CATH;  Surgeon: Haywood Lasso, Andrea Castro;  Location: [place];  Service: General;  Laterality: N/A;  . PORTACATH PLACEMENT  12/17/09  . RECONSTRUCTION BREAST W/ TRAM FLAP  02/20/11   bilateral  . torn ligament  2002   left knee  . TUBAL LIGATION  2004    FAMILY HISTORY Family History  Problem Relation Age of Onset  . COPD Father   . Hypertension Father   . Hypertension Mother   . Parkinson's disease Mother    There is no history of breast or ovarian cancer in the family.  GYNECOLOGIC HISTORY: G5, P3, 2 miscarriages.  Menarche age 61.  Age of parity 32.  Her last menstrual period was in 11/2009.  She did have a previous uterine biopsy.  She used birth control pills from the ages of 7 through 61.   SOCIAL HISTORY:  Mr. and Mrs. Brunke were married in 1979.  Her husband Jerrye Beavers worked at  [employer] since 1972. He is currently not employed. 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  The patient and her husband are originally from [place].  They have 3 children. As of OCT 2018, pt reports that her daughter is in college and is planning on applying to medical school. She is also thinking of joining the [employer]. The older of her two sons is a Equities trader in high school. He he would like to pursue becoming a Academic librarian after graduating. The third child, a son, is currently 14 years old and doing well in school.  ADVANCED DIRECTIVES: Not on file  HEALTH MAINTENANCE: Social History  Substance Use Topics  . Smoking status: Never Smoker  . Smokeless tobacco: Never Used  . Alcohol use No    Colonoscopy: 09/24/2012 PAP: Not on file Bone density:  The patient's last bone density scan on10/03/2016 showed a T score of 0.0 (normal). Lipid panel:  Not on file  No Known Allergies  Current Outpatient Prescriptions  Medication Sig Dispense Refill  . anastrozole (ARIMIDEX) 1 MG tablet Take 1 tablet (1 mg total) by mouth daily. 90  tablet 3  . aspirin EC 81 MG tablet Take 1 tablet (81 mg total) by mouth daily. 30 tablet 1  . b complex vitamins tablet Take 1 tablet by mouth daily.    . Biotin 2500 MCG CAPS Take 5,000 mcg by mouth daily.     . calcium-vitamin D (OSCAL WITH D) 500-200 MG-UNIT per tablet Take 2 tablets by mouth.    . ferrous sulfate 325 (65 FE) MG tablet Take 325 mg by mouth daily with breakfast.    . fish oil-omega-3 fatty acids 1000 MG capsule Take 1 g by mouth 2 (two) times daily.     . Glucosamine HCl 1000 MG TABS Take 1,000 mg by mouth 2 (two) times daily.     Marland Kitchen ibuprofen (ADVIL,MOTRIN) 200 MG tablet Take 400 mg by mouth every 8 (eight) hours as needed for mild pain or moderate pain.    Marland Kitchen loratadine (CLARITIN) 10 MG tablet Take 10 mg  by mouth daily as needed for allergies.    . Multiple Vitamin (MULTIVITAMIN PO) Take by mouth daily.      . Probiotic Product (PROBIOTIC DAILY PO) Take 1 capsule by mouth daily.    Marland Kitchen zinc gluconate 50 MG tablet Take 50 mg by mouth daily.     No current facility-administered medications for this visit.     OBJECTIVE: Middle-aged white Castro who appears well  Vitals:   08/18/17 0857  BP: (!) 153/88  Pulse: 73  Resp: 18  Temp: 97.6 F (36.4 C)  SpO2: 98%     Body mass index is 32.12 kg/m.      ECOG FS: 0 - Asymptomatic  Sclerae unicteric, pupils round and equal Oropharynx clear and moist No cervical or supraclavicular adenopathy Lungs no rales or rhonchi Heart regular rate and rhythm Abd soft, nontender, positive bowel sounds MSK no focal spinal tenderness, no upper extremity lymphedema Neuro: nonfocal, well oriented, appropriate affect Breasts: Status post bilateral mastectomies with bilateral TRAM reconstruction.  There is no evidence of local recurrence.  Both axillae are benign.  LAB RESULTS: Lab Results  Component Value Date   WBC 4.3 08/11/2017   NEUTROABS 2.7 08/11/2017   HGB 13.2 08/11/2017   HCT 40.0 08/11/2017   MCV 93.9 08/11/2017   PLT  210 08/11/2017      Chemistry      Component Value Date/Time   NA 141 08/11/2017 0824   K 4.1 08/11/2017 0824   CL 107 08/07/2015 0842   CL 105 08/05/2012 1355   CO2 25 08/11/2017 0824   BUN 13.3 08/11/2017 0824   CREATININE 0.9 08/11/2017 0824      Component Value Date/Time   CALCIUM 9.8 08/11/2017 0824   ALKPHOS 84 08/11/2017 0824   AST 15 08/11/2017 0824   ALT 16 08/11/2017 0824   BILITOT 0.57 08/11/2017 0824       STUDIES: Patient does not need mammography since she is status post bilateral mastectomies.  ASSESSMENT: 61 y.o. Andrea Castro:  1.  The patient had a left breast upper outer quadrant and left axillary needle core biopsy on 11/30/2009, both positive for a clinical T3 N1-2, stage IIIA invasive ductal carcinoma, estrogen receptor negative, progesterone receptor negative, Ki-67 40%, HER-2/neu amplified by CISH with a ratio of 2.65.  2.  received neoadjuvant chemotherapy with docetaxel, carboplatin and trastuzumab x 6 cycles from 12/28/2009 through 04/19/2010 with Neulasta support.  Trastuzumab was continued to complete one year (to 12/27/2010).  3.  Status post left modified radical mastectomy 05/22/2010 for a ypTX , ypN0 (i+)) residual invasive ductal carcinoma, estrogen receptor 3% positive ("faint staining"), progesterone receptor negative, Ki-67 40%, HER-2/neu by CISH again amplified with a ratio 2.58,  4.  The patient had radiation therapy from 07/09/2010 through 08/19/2010.    5.  Status post right simple mastectomy on 10/30/2010 with benign pathology  6.  status post reconstruction  with bilateral transverse rectus abdominis myocutaneous flaps in 02/2011.  7.  The patient declined genetic testing.  8. anastrozole started August 2014  (a) bone density at the [facility] 07/25/2016 shows a T score of 0.00 (normal).  PLAN: Andrea Castro is now nearly 7 years out from definitive surgery for her breast cancer, with no evidence of disease recurrence.  At  this point I am comfortable with her stopping anastrozole when she runs out of her current supply, and I am releasing her to her primary care physician's follow-up.  Will she will need in terms of  breast cancer screening is a yearly physician breast exam  I will be glad to see Andrea Castro at any point in the future if and when the need arises but as of now are making no further routine appointments for her here.   Andrea Castro, Andrea Dad, Andrea Castro  08/18/17 9:09 AM Medical Oncology and Hematology [facility] [address] [place], [place] [address] Tel. [phone]    Fax. [phone]  This document serves as a record of services personally performed by Chauncey Cruel, Andrea Castro. It was created on his behalf by Margit Banda, a trained medical scribe. The creation of this record is based on the scribe's personal observations and the provider's statements to them. This document has been checked and approved by the attending provider.

## 2017-08-18 ENCOUNTER — Telehealth: Payer: Self-pay | Admitting: Oncology

## 2017-08-18 ENCOUNTER — Encounter: Payer: Self-pay | Admitting: Oncology

## 2017-08-18 ENCOUNTER — Encounter: Payer: Self-pay | Admitting: *Deleted

## 2017-08-18 ENCOUNTER — Ambulatory Visit (HOSPITAL_BASED_OUTPATIENT_CLINIC_OR_DEPARTMENT_OTHER): Payer: 59 | Admitting: Oncology

## 2017-08-18 VITALS — BP 153/88 | HR 73 | Temp 97.6°F | Resp 18 | Ht 64.25 in | Wt 188.6 lb

## 2017-08-18 DIAGNOSIS — Z170 Estrogen receptor positive status [ER+]: Secondary | ICD-10-CM | POA: Diagnosis not present

## 2017-08-18 DIAGNOSIS — C50412 Malignant neoplasm of upper-outer quadrant of left female breast: Secondary | ICD-10-CM | POA: Diagnosis not present

## 2017-08-18 DIAGNOSIS — C773 Secondary and unspecified malignant neoplasm of axilla and upper limb lymph nodes: Secondary | ICD-10-CM | POA: Diagnosis not present

## 2017-08-18 NOTE — Telephone Encounter (Signed)
No 10/30 los. °

## 2018-09-09 DIAGNOSIS — H52221 Regular astigmatism, right eye: Secondary | ICD-10-CM | POA: Diagnosis not present

## 2018-09-09 DIAGNOSIS — H5213 Myopia, bilateral: Secondary | ICD-10-CM | POA: Diagnosis not present

## 2018-09-09 DIAGNOSIS — H11153 Pinguecula, bilateral: Secondary | ICD-10-CM | POA: Diagnosis not present

## 2018-09-09 DIAGNOSIS — H524 Presbyopia: Secondary | ICD-10-CM | POA: Diagnosis not present

## 2019-05-06 DIAGNOSIS — N814 Uterovaginal prolapse, unspecified: Secondary | ICD-10-CM | POA: Diagnosis not present

## 2019-05-06 DIAGNOSIS — R3912 Poor urinary stream: Secondary | ICD-10-CM | POA: Diagnosis not present

## 2019-05-06 DIAGNOSIS — R350 Frequency of micturition: Secondary | ICD-10-CM | POA: Diagnosis not present

## 2019-05-06 DIAGNOSIS — N8111 Cystocele, midline: Secondary | ICD-10-CM | POA: Diagnosis not present

## 2019-05-11 DIAGNOSIS — Z01419 Encounter for gynecological examination (general) (routine) without abnormal findings: Secondary | ICD-10-CM | POA: Diagnosis not present

## 2019-05-11 DIAGNOSIS — R319 Hematuria, unspecified: Secondary | ICD-10-CM | POA: Diagnosis not present

## 2019-05-11 DIAGNOSIS — Z853 Personal history of malignant neoplasm of breast: Secondary | ICD-10-CM | POA: Diagnosis not present

## 2019-05-11 DIAGNOSIS — Z1389 Encounter for screening for other disorder: Secondary | ICD-10-CM | POA: Diagnosis not present

## 2019-05-11 DIAGNOSIS — R8290 Unspecified abnormal findings in urine: Secondary | ICD-10-CM | POA: Diagnosis not present

## 2019-05-11 DIAGNOSIS — Z130 Encounter for screening for diseases of the blood and blood-forming organs and certain disorders involving the immune mechanism: Secondary | ICD-10-CM | POA: Diagnosis not present

## 2019-05-11 DIAGNOSIS — N8111 Cystocele, midline: Secondary | ICD-10-CM | POA: Diagnosis not present

## 2019-05-11 DIAGNOSIS — Z124 Encounter for screening for malignant neoplasm of cervix: Secondary | ICD-10-CM | POA: Diagnosis not present

## 2019-05-11 DIAGNOSIS — N393 Stress incontinence (female) (male): Secondary | ICD-10-CM | POA: Diagnosis not present

## 2019-05-17 DIAGNOSIS — N3946 Mixed incontinence: Secondary | ICD-10-CM | POA: Diagnosis not present

## 2019-05-17 DIAGNOSIS — N390 Urinary tract infection, site not specified: Secondary | ICD-10-CM | POA: Diagnosis not present

## 2019-05-17 DIAGNOSIS — R3121 Asymptomatic microscopic hematuria: Secondary | ICD-10-CM | POA: Diagnosis not present

## 2019-05-17 DIAGNOSIS — B951 Streptococcus, group B, as the cause of diseases classified elsewhere: Secondary | ICD-10-CM | POA: Diagnosis not present

## 2019-05-17 DIAGNOSIS — N8111 Cystocele, midline: Secondary | ICD-10-CM | POA: Diagnosis not present

## 2019-05-18 DIAGNOSIS — R3121 Asymptomatic microscopic hematuria: Secondary | ICD-10-CM | POA: Diagnosis not present

## 2019-05-19 MED FILL — CEPHALEXIN 500 MG CAPSULE: 500 | 7 days supply | Qty: 14 | Fill #0

## 2019-05-30 DIAGNOSIS — R3129 Other microscopic hematuria: Secondary | ICD-10-CM | POA: Diagnosis not present

## 2019-05-30 DIAGNOSIS — R3121 Asymptomatic microscopic hematuria: Secondary | ICD-10-CM | POA: Diagnosis not present

## 2019-06-01 DIAGNOSIS — N8111 Cystocele, midline: Secondary | ICD-10-CM | POA: Diagnosis not present

## 2019-06-01 DIAGNOSIS — N3946 Mixed incontinence: Secondary | ICD-10-CM | POA: Diagnosis not present

## 2019-06-08 DIAGNOSIS — N281 Cyst of kidney, acquired: Secondary | ICD-10-CM | POA: Diagnosis not present

## 2019-06-08 DIAGNOSIS — N8111 Cystocele, midline: Secondary | ICD-10-CM | POA: Diagnosis not present

## 2019-06-09 ENCOUNTER — Other Ambulatory Visit (HOSPITAL_COMMUNITY): Payer: Self-pay | Admitting: Urology

## 2019-06-09 ENCOUNTER — Other Ambulatory Visit: Payer: Self-pay | Admitting: Urology

## 2019-06-09 DIAGNOSIS — N281 Cyst of kidney, acquired: Secondary | ICD-10-CM

## 2019-06-10 DIAGNOSIS — Z4689 Encounter for fitting and adjustment of other specified devices: Secondary | ICD-10-CM | POA: Diagnosis not present

## 2019-06-10 DIAGNOSIS — N8111 Cystocele, midline: Secondary | ICD-10-CM | POA: Diagnosis not present

## 2019-06-16 ENCOUNTER — Ambulatory Visit (HOSPITAL_COMMUNITY)
Admission: RE | Admit: 2019-06-16 | Discharge: 2019-06-16 | Disposition: A | Discharge status: Home / Self Care | Payer: 59 | Source: Ambulatory Visit | Attending: Urology | Admitting: Urology

## 2019-06-16 ENCOUNTER — Other Ambulatory Visit: Payer: Self-pay

## 2019-06-16 DIAGNOSIS — N281 Cyst of kidney, acquired: Secondary | ICD-10-CM | POA: Insufficient documentation

## 2019-06-16 MED ORDER — GADOBUTROL 1 MMOL/ML IV SOLN
8.0000 mL | Freq: Once | INTRAVENOUS | Status: AC | PRN
  Administered 2019-06-16: 8 mL via INTRAVENOUS

## 2019-07-04 ENCOUNTER — Other Ambulatory Visit: Payer: Self-pay | Admitting: Obstetrics and Gynecology

## 2019-07-05 ENCOUNTER — Other Ambulatory Visit: Payer: Self-pay | Admitting: Urology

## 2019-08-22 DIAGNOSIS — N8111 Cystocele, midline: Secondary | ICD-10-CM | POA: Diagnosis not present

## 2019-08-22 DIAGNOSIS — Z4689 Encounter for fitting and adjustment of other specified devices: Secondary | ICD-10-CM | POA: Diagnosis not present

## 2019-08-22 DIAGNOSIS — N393 Stress incontinence (female) (male): Secondary | ICD-10-CM | POA: Diagnosis not present

## 2019-08-23 DIAGNOSIS — N8111 Cystocele, midline: Secondary | ICD-10-CM | POA: Diagnosis not present

## 2019-08-26 NOTE — Patient Instructions (Addendum)
YOU ARE SCHEDULED FOR A COVID TEST __11/9_______@__2 :40PM__________. THIS TEST MUST BE DONE BEFORE SURGERY. GO TO  [address], [place], [address] AND REMAIN IN YOUR CAR, THIS IS A DRIVE UP TEST. ONCE YOUR COVID TEST IS DONE PLEASE FOLLOW ALL THE QUARANTINE  INSTRUCTIONS GIVEN IN YOUR HANDOUT.      Your procedure is scheduled on 09/01/19   Report to [facility] AT    6:00 A. M.   Call this number if you have problems the morning of surgery  [phone].   OUR ADDRESS IS [place].  WE ARE LOCATED IN [facility].                                    YOU MAY  BRUSH YOUR TEETH MORNING OF SURGERY AND RINSE YOUR MOUTH OUT, NO CHEWING GUM CANDY OR MINTS.  Do not eat food After Midnight  . YOU MAY HAVE CLEAR LIQUIDS FROM MIDNIGHT UNTIL 5:00 AM.   At 5:00 AM Please finish the prescribed Pre-Surgery  drink  . Nothing by mouth after you finish the  drink !    TAKE THESE MEDICATIONS MORNING OF SURGERY WITH A SIP OF WATER:  ___Claritin_  IF YOU ARE SPENDING THE NIGHT AFTER SURGERY PLEASE BRING ALL YOUR PRESCRIPTION MEDICATIONS IN THEIR ORIGINAL BOTTLES.  1 VISITOR IS ALLOWED IN WAITING ROOM ONLY DAY OF SURGERY . NO VISITOR MAY SPEND THE NIGHT. VISITOR ARE ALLOWED TO STAY UNTIL 800 PM.                                    DO NOT WEAR JEWERLY, MAKE UP, OR NAIL POLISH ON FINGERNAILS. DO NOT WEAR LOTIONS, POWDERS, PERFUMES OR DEODORANT. DO NOT SHAVE FOR 24 HOURS PRIOR TO DAY OF SURGERY. MEN MAY SHAVE FACE AND NECK. CONTACTS, GLASSES, OR DENTURES MAY NOT BE WORN TO SURGERY.                                    [facility] IS NOT RESPONSIBLE  FOR ANY BELONGINGS.                                                                    Marland Kitchen                                                                                                    [facility] - Preparing for Surgery  Before surgery, you can play an important role.   Because skin is not sterile,  your skin needs to be as free of germs as possible.  You can reduce the number of germs on your skin by washing with CHG (chlorahexidine gluconate) soap before surgery.   CHG is an antiseptic cleaner which kills germs and bonds with the skin to continue killing germs even after washing. Please DO NOT use if you have an allergy to CHG or antibacterial soaps.   If your skin becomes reddened/irritated stop using the CHG and inform your nurse when you arrive at Short Stay. Do not shave (including legs and underarms) for at least 48 hours prior to the first CHG shower.    Please follow these instructions carefully:  1.  Shower with CHG Soap the night before surgery and the  morning of Surgery.  2.  If you choose to wash your hair, wash your hair first as usual with your  normal  shampoo.  3.  After you shampoo, rinse your hair and body thoroughly to remove the  shampoo.                                        4.  Use CHG as you would any other liquid soap.  You can apply chg directly  to the skin and wash                       Gently with a scrungie or clean washcloth.  5.  Apply the CHG Soap to your body ONLY FROM THE NECK DOWN.   Do not use on face/ open                           Wound or open sores. Avoid contact with eyes, ears mouth and genitals (private parts).                       Wash face,  Genitals (private parts) with your normal soap.             6.  Wash thoroughly, paying special attention to the area where your surgery  will be performed.  7.  Thoroughly rinse your body with warm water from the neck down.  8.  DO NOT shower/wash with your normal soap after using and rinsing off  the CHG Soap.            9.  Pat yourself dry with a clean towel.            10.  Wear clean pajamas.            11.  Place clean sheets on your bed the night of your first shower and do not  sleep with pets. Day of Surgery : Do not apply any lotions/deodorants the morning of surgery.  Please wear clean  clothes to the hospital/surgery center.  FAILURE TO FOLLOW THESE INSTRUCTIONS MAY RESULT IN THE CANCELLATION OF YOUR SURGERY PATIENT SIGNATURE_________________________________  NURSE SIGNATURE__________________________________  ________________________________________________________________________   Andrea Castro  An incentive spirometer is a tool that can help keep your lungs clear and active. This tool measures how well you are filling your lungs with each breath. Taking long deep breaths may help reverse or decrease the chance of developing breathing (pulmonary) problems (especially infection) following:  A long period of time when you are unable to move or be active. BEFORE THE PROCEDURE   If the spirometer includes an indicator to show your best  effort, your nurse or respiratory therapist will set it to a desired goal.  If possible, sit up straight or lean slightly forward. Try not to slouch.  Hold the incentive spirometer in an upright position. INSTRUCTIONS FOR USE  1. Sit on the edge of your bed if possible, or sit up as far as you can in bed or on a chair. 2. Hold the incentive spirometer in an upright position. 3. Breathe out normally. 4. Place the mouthpiece in your mouth and seal your lips tightly around it. 5. Breathe in slowly and as deeply as possible, raising the piston or the ball toward the top of the column. 6. Hold your breath for 3-5 seconds or for as long as possible. Allow the piston or ball to fall to the bottom of the column. 7. Remove the mouthpiece from your mouth and breathe out normally. 8. Rest for a few seconds and repeat Steps 1 through 7 at least 10 times every 1-2 hours when you are awake. Take your time and take a few normal breaths between deep breaths. 9. The spirometer may include an indicator to show your best effort. Use the indicator as a goal to work toward during each repetition. 10. After each set of 10 deep breaths, practice  coughing to be sure your lungs are clear. If you have an incision (the cut made at the time of surgery), support your incision when coughing by placing a pillow or rolled up towels firmly against it. Once you are able to get out of bed, walk around indoors and cough well. You may stop using the incentive spirometer when instructed by your caregiver.  RISKS AND COMPLICATIONS  Take your time so you do not get dizzy or light-headed.  If you are in pain, you may need to take or ask for pain medication before doing incentive spirometry. It is harder to take a deep breath if you are having pain. AFTER USE  Rest and breathe slowly and easily.  It can be helpful to keep track of a log of your progress. Your caregiver can provide you with a simple table to help with this. If you are using the spirometer at home, follow these instructions: [place] IF:   You are having difficultly using the spirometer.  You have trouble using the spirometer as often as instructed.  Your pain medication is not giving enough relief while using the spirometer.  You develop fever of 100.5 F (38.1 C) or higher. SEEK IMMEDIATE MEDICAL CARE IF:   You cough up bloody sputum that had not been present before.  You develop fever of 102 F (38.9 C) or greater.  You develop worsening pain at or near the incision site. MAKE SURE YOU:   Understand these instructions.  Will watch your condition.  Will get help right away if you are not doing well or get worse. Document Released: 02/16/2007 Document Revised: 12/29/2011 Document Reviewed: 04/19/2007 ExitCare Patient Information 2014 ExitCare, [place].   ________________________________________________________________________  WHAT IS A BLOOD TRANSFUSION? Blood Transfusion Information  A transfusion is the replacement of blood or some of its parts. Blood is made up of multiple cells which provide different functions.  Red blood cells carry oxygen and are  used for blood loss replacement.  White blood cells fight against infection.  Platelets control bleeding.  Plasma helps clot blood.  Other blood products are available for specialized needs, such as hemophilia or other clotting disorders. BEFORE THE TRANSFUSION  Who gives blood for transfusions?  Healthy volunteers who are fully evaluated to make sure their blood is safe. This is blood bank blood. Transfusion therapy is the safest it has ever been in the practice of medicine. Before blood is taken from a donor, a complete history is taken to make sure that person has no history of diseases nor engages in risky social behavior (examples are intravenous drug use or sexual activity with multiple partners). The donor's travel history is screened to minimize risk of transmitting infections, such as malaria. The donated blood is tested for signs of infectious diseases, such as HIV and hepatitis. The blood is then tested to be sure it is compatible with you in order to minimize the chance of a transfusion reaction. If you or a relative donates blood, this is often done in anticipation of surgery and is not appropriate for emergency situations. It takes many days to process the donated blood. RISKS AND COMPLICATIONS Although transfusion therapy is very safe and saves many lives, the main dangers of transfusion include:   Getting an infectious disease.  Developing a transfusion reaction. This is an allergic reaction to something in the blood you were given. Every precaution is taken to prevent this. The decision to have a blood transfusion has been considered carefully by your caregiver before blood is given. Blood is not given unless the benefits outweigh the risks. AFTER THE TRANSFUSION  Right after receiving a blood transfusion, you will usually feel much better and more energetic. This is especially true if your red blood cells have gotten low (anemic). The transfusion raises the level of the red  blood cells which carry oxygen, and this usually causes an energy increase.  The nurse administering the transfusion will monitor you carefully for complications. HOME CARE INSTRUCTIONS  No special instructions are needed after a transfusion. You may find your energy is better. Speak with your caregiver about any limitations on activity for underlying diseases you may have. SEEK MEDICAL CARE IF:   Your condition is not improving after your transfusion.  You develop redness or irritation at the intravenous (IV) site. SEEK IMMEDIATE MEDICAL CARE IF:  Any of the following symptoms occur over the next 12 hours:  Shaking chills.  You have a temperature by mouth above 102 F (38.9 C), not controlled by medicine.  Chest, back, or muscle pain.  People around you feel you are not acting correctly or are confused.  Shortness of breath or difficulty breathing.  Dizziness and fainting.  You get a rash or develop hives.  You have a decrease in urine output.  Your urine turns a dark color or changes to pink, red, or brown. Any of the following symptoms occur over the next 10 days:  You have a temperature by mouth above 102 F (38.9 C), not controlled by medicine.  Shortness of breath.  Weakness after normal activity.  The white part of the eye turns yellow (jaundice).  You have a decrease in the amount of urine or are urinating less often.  Your urine turns a dark color or changes to pink, red, or brown. Document Released: 10/03/2000 Document Revised: 12/29/2011 Document Reviewed: 05/22/2008 [facility] Patient Information 2014 [place], [place].  _______________________________________________________________________

## 2019-08-29 ENCOUNTER — Other Ambulatory Visit (HOSPITAL_COMMUNITY)
Admission: RE | Admit: 2019-08-29 | Discharge: 2019-08-29 | Disposition: A | Discharge status: Home / Self Care | Payer: 59 | Source: Ambulatory Visit | Attending: Obstetrics and Gynecology | Admitting: Obstetrics and Gynecology

## 2019-08-29 ENCOUNTER — Encounter (HOSPITAL_COMMUNITY)
Admission: RE | Admit: 2019-08-29 | Discharge: 2019-08-29 | Disposition: A | Discharge status: Home / Self Care | Payer: 59 | Source: Ambulatory Visit | Attending: Obstetrics and Gynecology | Admitting: Obstetrics and Gynecology

## 2019-08-29 ENCOUNTER — Encounter (HOSPITAL_COMMUNITY): Payer: Self-pay

## 2019-08-29 ENCOUNTER — Other Ambulatory Visit: Payer: Self-pay

## 2019-08-29 DIAGNOSIS — N393 Stress incontinence (female) (male): Secondary | ICD-10-CM | POA: Diagnosis not present

## 2019-08-29 DIAGNOSIS — Z01812 Encounter for preprocedural laboratory examination: Secondary | ICD-10-CM | POA: Diagnosis not present

## 2019-08-29 DIAGNOSIS — N814 Uterovaginal prolapse, unspecified: Secondary | ICD-10-CM | POA: Insufficient documentation

## 2019-08-29 HISTORY — DX: Other specified postprocedural states: R11.2

## 2019-08-29 HISTORY — DX: Other complications of anesthesia, initial encounter: T88.59XA

## 2019-08-29 HISTORY — DX: Other specified postprocedural states: Z98.890

## 2019-08-29 LAB — CBC WITH DIFFERENTIAL/PLATELET
Abs Immature Granulocytes: 0.01 10*3/uL (ref 0.00–0.07)
Basophils Absolute: 0 10*3/uL (ref 0.0–0.1)
Basophils Relative: 0 %
Eosinophils Absolute: 0.1 10*3/uL (ref 0.0–0.5)
Eosinophils Relative: 1 %
HCT: 41.9 % (ref 36.0–46.0)
Hemoglobin: 13.4 g/dL (ref 12.0–15.0)
Immature Granulocytes: 0 %
Lymphocytes Relative: 27 %
Lymphs Abs: 2 10*3/uL (ref 0.7–4.0)
MCH: 28.3 pg (ref 26.0–34.0)
MCHC: 32 g/dL (ref 30.0–36.0)
MCV: 88.6 fL (ref 80.0–100.0)
Monocytes Absolute: 0.6 10*3/uL (ref 0.1–1.0)
Monocytes Relative: 8 %
Neutro Abs: 4.5 10*3/uL (ref 1.7–7.7)
Neutrophils Relative %: 64 %
Platelets: 208 10*3/uL (ref 150–400)
RBC: 4.73 MIL/uL (ref 3.87–5.11)
RDW: 13.5 % (ref 11.5–15.5)
WBC: 7.2 10*3/uL (ref 4.0–10.5)
nRBC: 0 % (ref 0.0–0.2)

## 2019-08-29 LAB — COMPREHENSIVE METABOLIC PANEL
ALT: 43 U/L (ref 0–44)
AST: 25 U/L (ref 15–41)
Albumin: 3.6 g/dL (ref 3.5–5.0)
Alkaline Phosphatase: 164 U/L — ABNORMAL HIGH (ref 38–126)
Anion gap: 6 (ref 5–15)
BUN: 20 mg/dL (ref 8–23)
CO2: 25 mmol/L (ref 22–32)
Calcium: 9.7 mg/dL (ref 8.9–10.3)
Chloride: 107 mmol/L (ref 98–111)
Creatinine, Ser: 0.62 mg/dL (ref 0.44–1.00)
GFR calc Af Amer: >60 mL/min (ref >60)
GFR calc non Af Amer: >60 mL/min (ref >60)
Glucose, Bld: 107 mg/dL — ABNORMAL HIGH (ref 70–99)
Potassium: 4.3 mmol/L (ref 3.5–5.1)
Sodium: 138 mmol/L (ref 135–145)
Total Bilirubin: 0.7 mg/dL (ref 0.3–1.2)
Total Protein: 7.4 g/dL (ref 6.5–8.1)

## 2019-08-29 LAB — SURGICAL PCR SCREEN
MRSA, PCR: NEGATIVE
Staphylococcus aureus: POSITIVE — AB

## 2019-08-29 NOTE — H&P (Signed)
Andrea Castro, Andrea Castro MEDICAL RECORD [id number] ACCOUNT [id number] DATE OF BIRTH:15-Jan-1956 FACILITY: [facility] LOCATION:  PHYSICIAN:Bernie Ransford FUlanda Edison, MD  HISTORY AND PHYSICAL  DATE OF ADMISSION:  09/01/2019  HISTORY OF PRESENT ILLNESS:  This is a 63 year old white female, para 3-0-2-3, who was admitted to the hospital for vaginal hysterectomy, possible removal of some or all of the fallopian tubes, anterior repair, possible posterior repair and urethral  sling procedure because of uterine prolapse, cystocele and stress urinary incontinence.  The patient was seen for the first time in over 5 years on 05/06/2019.  At that time, she complained of tissue coming out of her vagina and urinary symptoms.  She  had noted four days before seen that she would go to the restroom, she would have a normal flow, but then the stream would stop.  She would repeat the process several times to empty the bladder.  She noted protrusion from her vagina the day prior to  being seen and then her urinary symptoms abated.  Her vaginal protrusion persisted.  On examination, she was noted to have a 4th degree cystocele.  She returned for a more complete exam on 05/11/2019 and she requested surgery for her prolapse.  She was  offered a pessary or observation, but she declined without any hesitation.  The cystocele was confirmed.  There were no lesions on the cervix.  Because she had a history of stress urinary incontinence, I asked her to see Dr. Vikki Ports, who evaluated her  and decided he should place a sling under the urethra at the time of the surgery.  The patient is admitted now for the vaginal hysterectomy, cystocele repair, sling procedure and if the tubes are easily identifiable, possibly remove the tubes.  PAST MEDICAL HISTORY:  Reveals no known drug allergies.  ILLNESSES:  Cancer of the left breast in 2011 She also had preeclampsia and postpartum hemorrhage.  PAST SURGICAL HISTORY:  Bilateral mastectomy, breast  reconstruction with bilateral TRAM flaps in 2012, sterilization in 2004 by tubal ligation, lumbar laminectomy in 1983 and a D and C in 1996.  FAMILY HISTORY:  Mother with arthritis and high blood pressure, father with COPD and high blood pressure, paternal grandmother with diabetes and maternal grandfather with renal failure.  SOCIAL HISTORY:  The patient never smoked.  Occasional alcohol.  Denies illicit drugs.  She works at [employer].  PHYSICAL EXAMINATION: GENERAL:  Well-developed, obese white female in no distress. VITAL SIGNS:  Blood pressure 148/78, temperature 98.5.  BMI 31.1, 5 feet 4 inches, 181 pounds. HEENT:  No abnormalities.  Pupils equal, reactive to light.  Nose and pharynx clear.  Teeth are in good condition.  Throat is normal. NECK:  No enlargement of the thyroid. HEART:  Normal size and sounds, no murmurs. LUNGS:  Clear to auscultation. BREASTS:  Bilateral TRAM flaps. ABDOMEN:  Soft, nontender, no masses.  Scar from the TRAM flap. PELVIC:  Vulva and vagina are clean.  Cervix clean.  There is a third to fourth degree cystocele and moderate prolapse of the cervix.  Uterus appears to be normal size.  Adnexa are clear.  Rectal exam was negative.  The patient was given a pessary to  improve her symptoms, awaiting surgery.  The pessary was then removed on 08/22/2019.  ADMITTING IMPRESSION:  Cystocele, uterine prolapse, stress urinary incontinence.  PLAN:  Admitted for vaginal hysterectomy, anterior and possible posterior repair, possible removal of part or all of the fallopian tubes and sling procedure by Dr. Matilde Sprang.  She  has been counseled about the risks of surgery including but not limited to  blood loss, infection, injury to surrounding structures, venous thrombosis, need for reoperation for hemorrhage or other problems.  She understands and agrees to proceed.  TN/NUANCE  D:08/26/2019 T:08/26/2019 JOB:[id number]

## 2019-08-29 NOTE — Progress Notes (Signed)
PCP - Dr. Wynn Maudlin Cardiologist - none Oncologist- Dr. Jana Hakim  Chest x-ray - no EKG - no Stress Test - no ECHO - no Cardiac Cath - no  Sleep Study - no CPAP -   Fasting Blood Sugar - NA Checks Blood Sugar _____ times a day  Blood Thinner Instructions:ASA Aspirin Instructions:stop 5 days prior from Dr. Jana Hakim Last Dose:08/19/19  Anesthesia review:   Patient denies shortness of breath, fever, cough and chest pain at PAT appointment yes  Patient verbalized understanding of instructions that were given to them at the PAT appointment. Patient was also instructed that they will need to review over the PAT instructions again at home before surgery. yes

## 2019-08-30 LAB — NOVEL CORONAVIRUS, NAA (HOSP ORDER, SEND-OUT TO REF LAB; TAT 18-24 HRS): SARS-CoV-2, NAA: NOT DETECTED

## 2019-08-30 LAB — ABO/RH: ABO/RH(D): O POS

## 2019-08-30 MED FILL — MUPIROCIN 2% OINTMENT: 2 | 25 days supply | Qty: 22 | Fill #0

## 2019-08-31 NOTE — Anesthesia Preprocedure Evaluation (Addendum)
Anesthesia Evaluation  Patient identified by MRN, date of birth, ID band Patient awake    Reviewed: Allergy & Precautions, NPO status , Patient's Chart, lab work & pertinent test results  History of Anesthesia Complications (+) PONV and history of anesthetic complications  Airway Mallampati: II  TM Distance: >3 FB Neck ROM: Full    Dental  (+) Dental Advisory Given, Teeth Intact   Pulmonary neg pulmonary ROS,    Pulmonary exam normal        Cardiovascular negative cardio ROS Normal cardiovascular exam     Neuro/Psych negative neurological ROS  negative psych ROS   GI/Hepatic negative GI ROS, Neg liver ROS,   Endo/Other  negative endocrine ROS  Renal/GU negative Renal ROS     Musculoskeletal  (+) Arthritis ,   Abdominal   Peds  Hematology negative hematology ROS (+)   Anesthesia Other Findings   Reproductive/Obstetrics  Hx breast cancer s/p mastectomy                            Anesthesia Physical  Anesthesia Plan  ASA: II  Anesthesia Plan: General   Post-op Pain Management:    Induction: Intravenous  PONV Risk Score and Plan: 4 or greater and Ondansetron, Dexamethasone, Treatment may vary due to age or medical condition, Midazolam and Scopolamine patch - Pre-op  Airway Management Planned: Oral ETT  Additional Equipment: None  Intra-op Plan:   Post-operative Plan: Extubation in OR  Informed Consent: I have reviewed the patients History and Physical, chart, labs and discussed the procedure including the risks, benefits and alternatives for the proposed anesthesia with the patient or authorized representative who has indicated his/her understanding and acceptance.     Dental advisory given  Plan Discussed with: CRNA and Anesthesiologist  Anesthesia Plan Comments:       Anesthesia Quick Evaluation

## 2019-09-01 ENCOUNTER — Encounter (HOSPITAL_BASED_OUTPATIENT_CLINIC_OR_DEPARTMENT_OTHER): Payer: Self-pay

## 2019-09-01 ENCOUNTER — Encounter (HOSPITAL_BASED_OUTPATIENT_CLINIC_OR_DEPARTMENT_OTHER)
Admission: RE | Disposition: A | Discharge status: Home / Self Care | Payer: Self-pay | Source: Home / Self Care | Attending: Obstetrics and Gynecology

## 2019-09-01 ENCOUNTER — Ambulatory Visit (HOSPITAL_BASED_OUTPATIENT_CLINIC_OR_DEPARTMENT_OTHER): Payer: 59 | Admitting: Physician Assistant

## 2019-09-01 ENCOUNTER — Ambulatory Visit (HOSPITAL_BASED_OUTPATIENT_CLINIC_OR_DEPARTMENT_OTHER): Payer: 59 | Admitting: Anesthesiology

## 2019-09-01 ENCOUNTER — Other Ambulatory Visit: Payer: Self-pay

## 2019-09-01 ENCOUNTER — Ambulatory Visit (HOSPITAL_BASED_OUTPATIENT_CLINIC_OR_DEPARTMENT_OTHER)
Admission: RE | Admit: 2019-09-01 | Discharge: 2019-09-02 | Disposition: A | Discharge status: Home / Self Care | Payer: 59 | Attending: Obstetrics and Gynecology | Admitting: Obstetrics and Gynecology

## 2019-09-01 DIAGNOSIS — N8110 Cystocele, unspecified: Secondary | ICD-10-CM | POA: Diagnosis not present

## 2019-09-01 DIAGNOSIS — N814 Uterovaginal prolapse, unspecified: Secondary | ICD-10-CM | POA: Insufficient documentation

## 2019-09-01 DIAGNOSIS — M1991 Primary osteoarthritis, unspecified site: Secondary | ICD-10-CM | POA: Insufficient documentation

## 2019-09-01 DIAGNOSIS — N8111 Cystocele, midline: Secondary | ICD-10-CM | POA: Insufficient documentation

## 2019-09-01 DIAGNOSIS — Z853 Personal history of malignant neoplasm of breast: Secondary | ICD-10-CM | POA: Insufficient documentation

## 2019-09-01 DIAGNOSIS — M1990 Unspecified osteoarthritis, unspecified site: Secondary | ICD-10-CM | POA: Diagnosis not present

## 2019-09-01 DIAGNOSIS — N393 Stress incontinence (female) (male): Secondary | ICD-10-CM | POA: Insufficient documentation

## 2019-09-01 DIAGNOSIS — Z90710 Acquired absence of both cervix and uterus: Secondary | ICD-10-CM | POA: Diagnosis present

## 2019-09-01 HISTORY — PX: RECTOCELE REPAIR: SHX761

## 2019-09-01 HISTORY — PX: PUBOVAGINAL SLING: SHX1035

## 2019-09-01 HISTORY — PX: VAGINAL HYSTERECTOMY: SHX2639

## 2019-09-01 HISTORY — PX: CYSTOCELE REPAIR: SHX163

## 2019-09-01 LAB — TYPE AND SCREEN
ABO/RH(D): O POS
Antibody Screen: NEGATIVE

## 2019-09-01 SURGERY — HYSTERECTOMY, VAGINAL
Anesthesia: General | Site: "Vagina "

## 2019-09-01 MED ORDER — SCOPOLAMINE 1 MG/3DAYS TD PT72
MEDICATED_PATCH | TRANSDERMAL | Status: DC | PRN
  Administered 2019-09-01: 1 via TRANSDERMAL

## 2019-09-01 MED ORDER — SCOPOLAMINE 1 MG/3DAYS TD PT72
MEDICATED_PATCH | TRANSDERMAL | Status: AC
  Filled 2019-09-01: qty 1

## 2019-09-01 MED ORDER — FENTANYL CITRATE (PF) 100 MCG/2ML IJ SOLN
INTRAMUSCULAR | Status: AC
  Filled 2019-09-01: qty 2

## 2019-09-01 MED ORDER — CIPROFLOXACIN IN D5W 400 MG/200ML IV SOLN
INTRAVENOUS | Status: AC
  Filled 2019-09-01: qty 200

## 2019-09-01 MED ORDER — ACETAMINOPHEN 160 MG/5ML PO SOLN
325.0000 mg | ORAL | Status: DC | PRN
  Filled 2019-09-01: qty 20.3

## 2019-09-01 MED ORDER — ENOXAPARIN SODIUM 40 MG/0.4ML ~~LOC~~ SOLN
40.0000 mg | SUBCUTANEOUS | Status: DC
  Administered 2019-09-02: 40 mg via SUBCUTANEOUS
  Filled 2019-09-01: qty 0.4

## 2019-09-01 MED ORDER — ENOXAPARIN SODIUM 40 MG/0.4ML ~~LOC~~ SOLN
40.0000 mg | SUBCUTANEOUS | Status: AC
  Administered 2019-09-01: 07:00:00 40 mg via SUBCUTANEOUS
  Filled 2019-09-01: qty 0.4

## 2019-09-01 MED ORDER — WHITE PETROLATUM EX OINT
TOPICAL_OINTMENT | CUTANEOUS | Status: AC
  Filled 2019-09-01: qty 5

## 2019-09-01 MED ORDER — IBUPROFEN 800 MG PO TABS
ORAL_TABLET | ORAL | Status: AC
  Filled 2019-09-01: qty 1

## 2019-09-01 MED ORDER — CEFAZOLIN SODIUM-DEXTROSE 2-4 GM/100ML-% IV SOLN
2.0000 g | INTRAVENOUS | Status: AC
  Administered 2019-09-01: 2 g via INTRAVENOUS
  Filled 2019-09-01: qty 100

## 2019-09-01 MED ORDER — PHENYLEPHRINE HCL (PRESSORS) 10 MG/ML IV SOLN
INTRAVENOUS | Status: AC
  Filled 2019-09-01: qty 2

## 2019-09-01 MED ORDER — OXYCODONE HCL 5 MG PO TABS
5.0000 mg | ORAL_TABLET | ORAL | Status: DC | PRN
  Administered 2019-09-01 (×3): 5 mg via ORAL
  Administered 2019-09-02 (×2): 10 mg via ORAL
  Filled 2019-09-01: qty 2

## 2019-09-01 MED ORDER — ROCURONIUM BROMIDE 10 MG/ML (PF) SYRINGE
PREFILLED_SYRINGE | INTRAVENOUS | Status: DC | PRN
  Administered 2019-09-01: 10 mg via INTRAVENOUS
  Administered 2019-09-01: 70 mg via INTRAVENOUS
  Administered 2019-09-01: 10 mg via INTRAVENOUS

## 2019-09-01 MED ORDER — OXYCODONE HCL 5 MG PO TABS
ORAL_TABLET | ORAL | Status: AC
  Filled 2019-09-01: qty 1

## 2019-09-01 MED ORDER — ACETAMINOPHEN 325 MG PO TABS
ORAL_TABLET | ORAL | Status: DC | PRN
  Administered 2019-09-01: 1000 mg via ORAL

## 2019-09-01 MED ORDER — ROCURONIUM BROMIDE 10 MG/ML (PF) SYRINGE
PREFILLED_SYRINGE | INTRAVENOUS | Status: AC
  Filled 2019-09-01: qty 10

## 2019-09-01 MED ORDER — IBUPROFEN 800 MG PO TABS
800.0000 mg | ORAL_TABLET | Freq: Three times a day (TID) | ORAL | Status: DC
  Administered 2019-09-01 – 2019-09-02 (×3): 800 mg via ORAL
  Filled 2019-09-01: qty 1

## 2019-09-01 MED ORDER — SODIUM CHLORIDE 0.9 % IV SOLN
INTRAVENOUS | Status: DC | PRN
  Administered 2019-09-01: 500 mL

## 2019-09-01 MED ORDER — CEFAZOLIN SODIUM-DEXTROSE 2-4 GM/100ML-% IV SOLN
INTRAVENOUS | Status: AC
  Filled 2019-09-01: qty 100

## 2019-09-01 MED ORDER — MEPERIDINE HCL 25 MG/ML IJ SOLN
6.2500 mg | INTRAMUSCULAR | Status: DC | PRN
  Filled 2019-09-01: qty 1

## 2019-09-01 MED ORDER — ENOXAPARIN SODIUM 40 MG/0.4ML ~~LOC~~ SOLN
SUBCUTANEOUS | Status: AC
  Filled 2019-09-01: qty 0.4

## 2019-09-01 MED ORDER — ONDANSETRON HCL 4 MG/2ML IJ SOLN
INTRAMUSCULAR | Status: AC
  Filled 2019-09-01: qty 2

## 2019-09-01 MED ORDER — PHENAZOPYRIDINE HCL 200 MG PO TABS
200.0000 mg | ORAL_TABLET | ORAL | Status: AC
  Administered 2019-09-01: 07:00:00 200 mg via ORAL
  Filled 2019-09-01: qty 1

## 2019-09-01 MED ORDER — PHENYLEPHRINE 40 MCG/ML (10ML) SYRINGE FOR IV PUSH (FOR BLOOD PRESSURE SUPPORT)
PREFILLED_SYRINGE | INTRAVENOUS | Status: AC
  Filled 2019-09-01: qty 10

## 2019-09-01 MED ORDER — SIMETHICONE 80 MG PO CHEW
80.0000 mg | CHEWABLE_TABLET | Freq: Four times a day (QID) | ORAL | Status: DC | PRN
  Filled 2019-09-01: qty 1

## 2019-09-01 MED ORDER — FENTANYL CITRATE (PF) 100 MCG/2ML IJ SOLN
INTRAMUSCULAR | Status: DC | PRN
  Administered 2019-09-01 (×4): 50 ug via INTRAVENOUS

## 2019-09-01 MED ORDER — HYDROMORPHONE HCL 1 MG/ML IJ SOLN
INTRAMUSCULAR | Status: AC
  Filled 2019-09-01: qty 1

## 2019-09-01 MED ORDER — CEFAZOLIN SODIUM-DEXTROSE 2-4 GM/100ML-% IV SOLN
2.0000 g | Freq: Three times a day (TID) | INTRAVENOUS | Status: AC
  Administered 2019-09-01 (×2): 2 g via INTRAVENOUS
  Filled 2019-09-01: qty 100

## 2019-09-01 MED ORDER — ESTRADIOL 0.1 MG/GM VA CREA
TOPICAL_CREAM | VAGINAL | Status: DC | PRN
  Administered 2019-09-01: 1 via VAGINAL

## 2019-09-01 MED ORDER — INDIGOTINDISULFONATE SODIUM 8 MG/ML IJ SOLN
INTRAMUSCULAR | Status: DC | PRN
  Administered 2019-09-01: 1 mL via INTRAVENOUS

## 2019-09-01 MED ORDER — SODIUM CHLORIDE 0.9 % IV SOLN
INTRAVENOUS | Status: DC
  Filled 2019-09-01 (×2): qty 0.4

## 2019-09-01 MED ORDER — STERILE WATER FOR IRRIGATION IR SOLN
Status: DC | PRN
  Administered 2019-09-01: 3000 mL

## 2019-09-01 MED ORDER — LORATADINE 10 MG PO TABS
10.0000 mg | ORAL_TABLET | Freq: Every day | ORAL | Status: DC | PRN
  Filled 2019-09-01: qty 1

## 2019-09-01 MED ORDER — HYDROMORPHONE HCL 1 MG/ML IJ SOLN
0.2000 mg | INTRAMUSCULAR | Status: DC | PRN
  Administered 2019-09-01 (×2): 0.5 mg via INTRAVENOUS
  Filled 2019-09-01: qty 1

## 2019-09-01 MED ORDER — LIDOCAINE-EPINEPHRINE (PF) 1 %-1:200000 IJ SOLN
INTRAMUSCULAR | Status: DC | PRN
  Administered 2019-09-01: 30 mL

## 2019-09-01 MED ORDER — KETOROLAC TROMETHAMINE 30 MG/ML IJ SOLN
INTRAMUSCULAR | Status: AC
  Filled 2019-09-01: qty 1

## 2019-09-01 MED ORDER — CIPROFLOXACIN IN D5W 400 MG/200ML IV SOLN
400.0000 mg | INTRAVENOUS | Status: AC
  Administered 2019-09-01: 400 mg via INTRAVENOUS
  Filled 2019-09-01: qty 200

## 2019-09-01 MED ORDER — FENTANYL CITRATE (PF) 100 MCG/2ML IJ SOLN
25.0000 ug | INTRAMUSCULAR | Status: DC | PRN
  Administered 2019-09-01: 11:00:00 25 ug via INTRAVENOUS
  Filled 2019-09-01: qty 1

## 2019-09-01 MED ORDER — LIDOCAINE 2% (20 MG/ML) 5 ML SYRINGE
INTRAMUSCULAR | Status: AC
  Filled 2019-09-01: qty 5

## 2019-09-01 MED ORDER — OXYCODONE HCL 5 MG/5ML PO SOLN
5.0000 mg | Freq: Once | ORAL | Status: DC | PRN
  Filled 2019-09-01: qty 5

## 2019-09-01 MED ORDER — PHENYLEPHRINE HCL-NACL 20-0.9 MG/250ML-% IV SOLN
INTRAVENOUS | Status: DC | PRN
  Administered 2019-09-01: 40 ug/min via INTRAVENOUS

## 2019-09-01 MED ORDER — ACETAMINOPHEN 325 MG PO TABS
325.0000 mg | ORAL_TABLET | ORAL | Status: DC | PRN
  Filled 2019-09-01: qty 2

## 2019-09-01 MED ORDER — LACTATED RINGERS IV SOLN
INTRAVENOUS | Status: DC
  Administered 2019-09-01: 19:00:00 via INTRAVENOUS
  Filled 2019-09-01 (×2): qty 1000

## 2019-09-01 MED ORDER — ONDANSETRON HCL 4 MG/2ML IJ SOLN
4.0000 mg | Freq: Once | INTRAMUSCULAR | Status: DC | PRN
  Filled 2019-09-01: qty 2

## 2019-09-01 MED ORDER — DEXAMETHASONE SODIUM PHOSPHATE 10 MG/ML IJ SOLN
INTRAMUSCULAR | Status: AC
  Filled 2019-09-01: qty 1

## 2019-09-01 MED ORDER — MENTHOL 3 MG MT LOZG
1.0000 | LOZENGE | OROMUCOSAL | Status: DC | PRN
  Filled 2019-09-01: qty 9

## 2019-09-01 MED ORDER — PROPOFOL 10 MG/ML IV BOLUS
INTRAVENOUS | Status: AC
  Filled 2019-09-01: qty 40

## 2019-09-01 MED ORDER — DEXAMETHASONE SODIUM PHOSPHATE 10 MG/ML IJ SOLN
INTRAMUSCULAR | Status: DC | PRN
  Administered 2019-09-01 (×2): 5 mg via INTRAVENOUS

## 2019-09-01 MED ORDER — ACETAMINOPHEN 325 MG PO TABS
650.0000 mg | ORAL_TABLET | ORAL | Status: DC | PRN
  Filled 2019-09-01: qty 2

## 2019-09-01 MED ORDER — ONDANSETRON HCL 4 MG PO TABS
4.0000 mg | ORAL_TABLET | Freq: Four times a day (QID) | ORAL | Status: DC | PRN
  Filled 2019-09-01: qty 1

## 2019-09-01 MED ORDER — LACTATED RINGERS IV SOLN
INTRAVENOUS | Status: DC
  Administered 2019-09-01 (×2): via INTRAVENOUS
  Administered 2019-09-01: 07:00:00 1000 mL via INTRAVENOUS
  Filled 2019-09-01: qty 1000

## 2019-09-01 MED ORDER — FENTANYL CITRATE (PF) 100 MCG/2ML IJ SOLN
INTRAMUSCULAR | Status: AC
  Filled 2019-09-01: qty 4

## 2019-09-01 MED ORDER — ONDANSETRON HCL 4 MG/2ML IJ SOLN
4.0000 mg | Freq: Four times a day (QID) | INTRAMUSCULAR | Status: DC | PRN
  Filled 2019-09-01: qty 2

## 2019-09-01 MED ORDER — PHENAZOPYRIDINE HCL 100 MG PO TABS
ORAL_TABLET | ORAL | Status: AC
  Filled 2019-09-01: qty 2

## 2019-09-01 MED ORDER — MIDAZOLAM HCL 2 MG/2ML IJ SOLN
INTRAMUSCULAR | Status: AC
  Filled 2019-09-01: qty 2

## 2019-09-01 MED ORDER — ONDANSETRON HCL 4 MG/2ML IJ SOLN
INTRAMUSCULAR | Status: DC | PRN
  Administered 2019-09-01: 4 mg via INTRAVENOUS

## 2019-09-01 MED ORDER — PROPOFOL 10 MG/ML IV BOLUS
INTRAVENOUS | Status: DC | PRN
  Administered 2019-09-01: 160 mg via INTRAVENOUS

## 2019-09-01 MED ORDER — PHENYLEPHRINE 40 MCG/ML (10ML) SYRINGE FOR IV PUSH (FOR BLOOD PRESSURE SUPPORT)
PREFILLED_SYRINGE | INTRAVENOUS | Status: DC | PRN
  Administered 2019-09-01 (×2): 80 ug via INTRAVENOUS

## 2019-09-01 MED ORDER — LIDOCAINE 2% (20 MG/ML) 5 ML SYRINGE
INTRAMUSCULAR | Status: DC | PRN
  Administered 2019-09-01: 80 mg via INTRAVENOUS

## 2019-09-01 MED ORDER — ACETAMINOPHEN 500 MG PO TABS
ORAL_TABLET | ORAL | Status: AC
  Filled 2019-09-01: qty 2

## 2019-09-01 MED ORDER — OXYCODONE HCL 5 MG PO TABS
5.0000 mg | ORAL_TABLET | Freq: Once | ORAL | Status: DC | PRN
  Filled 2019-09-01: qty 1

## 2019-09-01 MED ORDER — SUGAMMADEX SODIUM 200 MG/2ML IV SOLN
INTRAVENOUS | Status: DC | PRN
  Administered 2019-09-01: 145 mg via INTRAVENOUS

## 2019-09-01 MED ORDER — MIDAZOLAM HCL 2 MG/2ML IJ SOLN
INTRAMUSCULAR | Status: DC | PRN
  Administered 2019-09-01: 2 mg via INTRAVENOUS

## 2019-09-01 SURGICAL SUPPLY — 70 items
BAG URINE DRAIN 2000ML AR STRL (UROLOGICAL SUPPLIES) IMPLANT
BLADE CLIPPER SENSICLIP SURGIC (BLADE) ×4 IMPLANT
BLADE SURG 15 STRL LF DISP TIS (BLADE) ×2 IMPLANT
BLADE SURG 15 STRL SS (BLADE) ×2
CANISTER SUCT 3000ML PPV (MISCELLANEOUS) ×4 IMPLANT
CANISTER SUCTION 1200CC (MISCELLANEOUS) ×4 IMPLANT
CATH FOLEY 2WAY SLVR  5CC 14FR (CATHETERS) ×2
CATH FOLEY 2WAY SLVR 5CC 14FR (CATHETERS) ×2 IMPLANT
COVER MAYO STAND STRL (DRAPES) ×9 IMPLANT
COVER WAND RF STERILE (DRAPES) ×5 IMPLANT
DECANTER SPIKE VIAL GLASS SM (MISCELLANEOUS) IMPLANT
DERMABOND ADVANCED (GAUZE/BANDAGES/DRESSINGS) ×2
DERMABOND ADVANCED .7 DNX12 (GAUZE/BANDAGES/DRESSINGS) ×2 IMPLANT
DRAPE STERI URO 9X17 APER PCH (DRAPES) ×4 IMPLANT
DRSG TEGADERM 4X4.75 (GAUZE/BANDAGES/DRESSINGS) ×6 IMPLANT
GAUZE 4X4 16PLY RFD (DISPOSABLE) ×13 IMPLANT
GAUZE PACKING IODOFORM 2 (PACKING) IMPLANT
GLOVE BIO SURGEON STRL SZ7.5 (GLOVE) ×12 IMPLANT
GLOVE BIOGEL PI IND STRL 6.5 (GLOVE) ×2 IMPLANT
GLOVE BIOGEL PI IND STRL 7.0 (GLOVE) ×2 IMPLANT
GLOVE BIOGEL PI IND STRL 7.5 (GLOVE) ×2 IMPLANT
GLOVE BIOGEL PI INDICATOR 6.5 (GLOVE) ×2
GLOVE BIOGEL PI INDICATOR 7.0 (GLOVE) ×2
GLOVE BIOGEL PI INDICATOR 7.5 (GLOVE) ×2
GLOVE ECLIPSE 6.0 STRL STRAW (GLOVE) ×4 IMPLANT
GOWN STRL REUS W/ TWL LRG LVL3 (GOWN DISPOSABLE) ×8 IMPLANT
GOWN STRL REUS W/ TWL XL LVL3 (GOWN DISPOSABLE) ×2 IMPLANT
GOWN STRL REUS W/TWL LRG LVL3 (GOWN DISPOSABLE) ×24 IMPLANT
GOWN STRL REUS W/TWL XL LVL3 (GOWN DISPOSABLE) ×2
HOLDER FOLEY CATH W/STRAP (MISCELLANEOUS) ×4 IMPLANT
KIT TURNOVER CYSTO (KITS) ×5 IMPLANT
MANIFOLD NEPTUNE II (INSTRUMENTS) IMPLANT
NDL MAYO CATGUT SZ4 TPR NDL (NEEDLE) ×1 IMPLANT
NDL SPNL 22GX3.5 QUINCKE BK (NEEDLE) IMPLANT
NEEDLE HYPO 22GX1.5 SAFETY (NEEDLE) ×4 IMPLANT
NEEDLE MAYO CATGUT SZ4 (NEEDLE) ×3 IMPLANT
NEEDLE SPNL 22GX3.5 QUINCKE BK (NEEDLE) IMPLANT
NS IRRIG 1000ML POUR BTL (IV SOLUTION) ×4 IMPLANT
PACK VAGINAL WOMENS (CUSTOM PROCEDURE TRAY) ×8 IMPLANT
PACKING VAGINAL (PACKING) ×4 IMPLANT
PAD OB MATERNITY 4.3X12.25 (PERSONAL CARE ITEMS) ×4 IMPLANT
PLUG CATH AND CAP STER (CATHETERS) ×4 IMPLANT
SET IRRIG Y TYPE TUR BLADDER L (SET/KITS/TRAYS/PACK) ×4 IMPLANT
SHEET LAVH (DRAPES) ×4 IMPLANT
SLING SUPRIS RETROPUBIC KIT (Miscellaneous) ×4 IMPLANT
SURGILUBE 2OZ TUBE FLIPTOP (MISCELLANEOUS) ×4 IMPLANT
SUT SILK 2 0 SH (SUTURE) IMPLANT
SUT VIC AB 0 CT1 18XCR BRD8 (SUTURE) ×7 IMPLANT
SUT VIC AB 0 CT1 27 (SUTURE) ×4
SUT VIC AB 0 CT1 27XBRD ANBCTR (SUTURE) ×4 IMPLANT
SUT VIC AB 0 CT1 8-18 (SUTURE) ×8
SUT VIC AB 1 CT1 36 (SUTURE) ×4 IMPLANT
SUT VIC AB 2-0 CT1 (SUTURE) ×6 IMPLANT
SUT VIC AB 2-0 SH 27 (SUTURE)
SUT VIC AB 2-0 SH 27XBRD (SUTURE) IMPLANT
SUT VIC AB 3-0 PS2 18 (SUTURE)
SUT VIC AB 3-0 PS2 18XBRD (SUTURE) IMPLANT
SUT VIC AB 3-0 SH 27 (SUTURE)
SUT VIC AB 3-0 SH 27X BRD (SUTURE) IMPLANT
SUT VICRYL 0 TIES 12 18 (SUTURE) ×4 IMPLANT
SUT VICRYL 4-0 PS2 18IN ABS (SUTURE) ×4 IMPLANT
SYR 10ML LL (SYRINGE) ×4 IMPLANT
SYR BULB IRRIGATION 50ML (SYRINGE) ×4 IMPLANT
TOWEL OR 17X26 10 PK STRL BLUE (TOWEL DISPOSABLE) ×9 IMPLANT
TRAY FOLEY W/BAG SLVR 14FR (SET/KITS/TRAYS/PACK) ×4 IMPLANT
TUBE CONNECTING 12'X1/4 (SUCTIONS) ×1
TUBE CONNECTING 12X1/4 (SUCTIONS) ×3 IMPLANT
UNDERPAD 30X36 HEAVY ABSORB (UNDERPADS AND DIAPERS) ×4 IMPLANT
WATER STERILE IRR 3000ML UROMA (IV SOLUTION) ×4 IMPLANT
WATER STERILE IRR 500ML POUR (IV SOLUTION) ×4 IMPLANT

## 2019-09-01 NOTE — Op Note (Signed)
Preoperative diagnosis: Stress urinary incontinence Postoperative diagnosis: Stress urinary incontinence Surgery: Sling cystourethropexy and cystoscopy Surgeon: Dr. Nicki Reaper Charlotta Lapaglia  The patient has the above diagnosis and consented the above procedure.  She had an excellent vault repair by gynecology.  A small aspect of Dr. Marijean Heath incision was opened near the bladder neck.  I first cystoscoped the patient.  She had excellent reflux bilaterally.  I initially had placed the legs in stirrups with extra care to minimize the risk of compartment syndrome and neuropathy and deep vein thrombosis  I made 2 less than 1 cm incisions 1 fingerbreadth above the symphysis pubis 1.5 cm lateral to the midline  I marked out a suburethral incision.  I used 3 cc of lidocaine epinephrine mixture.  I made the appropriate depth incision leaving a tissue bridge full-thickness between my incision and gynecology incision.  I mobilized laterally to the urethrovesical angle with scissors.  With the bladder emptied the trocar was passed on top of along the back the symphysis pubis onto the pulp of my index finger bilaterally.  I cystoscoped the patient.  No bladder injury.  No urethral injury.  Excellent efflux bilaterally  With the bladder empty I attached the mesh and brought up through the retropubic space.  I tensioned it over the fat part of a moderate size Kelly clamp clear I was very pleased with mobility and no spring back of the mesh.  It was in excellent position.  Is very pleased with it.  I closed the anterior vaginal wall with running 2-0 Vicryl on a CT1 needle followed by 2 interrupted sutures.  I did the same with the short opening of the gynecology incision.  The mesh was cut below the abdomen I closed the 2 short abdominal incisions with interrupted 4-0 Vicryl and Dermabond.  Vaginal pack applied.  Is very pleased with surgery and hopefully it reaches her goal

## 2019-09-01 NOTE — Progress Notes (Signed)
Op note: Pre and post op dx: Uterine prolapse, cystocele and SUI   Op: Vaginal hysterwectomy, anterior repair and sling procedure  Op: Ulanda Edison, MacDiarmid  Asst: Marvel Plan.  General anesthesia EBL 200 cc's for the vaginal; hysterectomy and anterior repair

## 2019-09-01 NOTE — Progress Notes (Signed)
DR. Ulanda Edison called unit and updated on pt status bp 141/75, pulse 107 Pain under control 1 on 10 scale out 750 cc of urine and getting up to walk in hall.  No new orders

## 2019-09-01 NOTE — H&P (Signed)
I was consulted by the above provider to assess the patient for possible incontinence surgery at the time of her hysterectomy and prolapse repair. She can feel vaginal bulging. Sometimes she reduces it. Sometimes she reduce it to urinate but not for bowel movements   She works as a Marine scientist in Dermatology. She has rare stress and urge incontinence if she holds it too long and no bedwetting and only wears a pad for safety   She voids every 2 hours depending on fluid intake and has no nocturia. Her flow is good. Sometimes she stops and starts and strains at the end can double void a modest amount.   She was told that she had microscopic hematuria from the lab tests sent off.   patient had red blood cells urine with calcium oxalate crystals. Culture sent   On pelvic examination she had a grade 3 cystocele with moderate central defect that reached the introitus. Her cervix descended from 8 or 9 cm to about 3 cm. She had a mid vaginal vault grade 1 rectocele. She had mild hypermobility of the bladder neck with the cystocele reduced with no stress incontinence with a lite cough   She was scanned for 228 mL   Patient has rare mixed incontinence. She has prolapse symptoms. The role of urodynamics and unmasking stress incontinence discussed. The workup of microscopic hematuria discussed. Call if urine cultures positive. Cystoscopy was deferred for insurance reasons. her elevated residual may be a false-positive but could also be related to her cystocele. Her ability to empty will be addressed during the urodynamics   the patient is planning a hysterectomy and vault suspension surgery. I think she is planning a vault suspension and cystocele repair. Return for urodynamic and cystoscopy. Order CT scan BUN and creatinine note sent to above provider   Today  Frequency stable. Incontinence stable. Sometimes leaks with cough or sneeze if she does not do a reflex contraction  CT scan demonstrated a small lesion  left kidney that my partner reviewed. An MRI now or in 6 months was suggested by Dr. Jacinto Reap.  On urodynamics the patient had not voided for 30 minutes was catheterized for 250 mL. Maximum bladder capacity is 460 mL. Bladder was unstable reaching pressure of 11 cm of water. She had urgency but did not leak. She said this would have been more problematic if she was standing. At 400 mL her Valsalva leak point pressure was 44 cm of water with moderate leakage. It was similar at lower volumes. During voluntary voiding she voided with an interrupted pattern. She had a prolonged bladder contraction. She voided 400 mL with a maximum flow of 29 mL/second. Maximum voiding pressure was 26 cm of water. Residual was 50 mL. The vaginal pack actually had to be removed and when it was so her voiding pattern was quite reasonable she had a cystocele noted with bladder neck hypermobility. The details of the urodynamics or sign-in dictate   Cystoscopy normal and a negative cough test after cystoscopy.   I drew a picture. I talked about physical therapy versus watchful waiting versus a sling. One could argue that she may be at higher risk of retention based upon some elevated residuals but she did empty with the voiding phase today. Mesh issues described. The role of delayed sling also discussed   The role of an MRI was discussed to make certain she has a cyst in her left kidney I will call her with the results   She would  very much like to have the sling at the same time and get all done. She has had a mesh hernia repair of the umbilicus which should be clinically not relevant.       ALLERGIES: No Allergies    MEDICATIONS: Anastrozole 1 mg tablet Oral  Aspirin 325 mg tablet Oral  Biotin 5 mg tablet Oral  Calcium TABS Oral  Fish Oil 1000 MG Oral Capsule Oral  Glucosamine Sulfate 500 mg capsule Oral  Iron 236 mg (27 mg iron) tablet Oral  Multivitamins tablet Oral  Vesicare 5 mg tablet 1 Oral  Zinc 50 mg tablet Oral      GU PSH: Complex cystometrogram, w/ void pressure and urethral pressure profile studies, any technique - 06/01/2019 Complex Uroflow - 06/01/2019 Emg surf Electrd - 06/01/2019 Inject For cystogram - 06/01/2019 Intrabd voidng Press - 06/01/2019 Locm 300-399Mg /Ml Iodine,1Ml - 05/30/2019       PSH Notes: Breast Surgery Mastectomy, Tubal Ligation, Back Surgery, Breast Surgery   NON-GU PSH: Anesth, Knee Area Surgery, Left, ACL tear Back surgery, Ruptured Disc - 1984 Breast Surgery Procedure - 2015 Mastoidectomy, Right - 2012, Left - 2011 Tubal Ligation - 2015     GU PMH: Microscopic hematuria - 05/17/2019 Incontinence w/o Sensation, Urinary incontinence without sensory awareness - 2015 Nocturnal Enuresis, Enuresis, nocturnal only - 2015 Cystocele, midline, Cystocele, midline - 2015 Mixed incontinence, Urge and stress incontinence - 2015 Nocturia, Nocturia - 2015 Other microscopic hematuria, Microscopic hematuria - 2015 Urinary Frequency, Increased urinary frequency - 2015    NON-GU PMH: Breast Cancer, History, History of breast cancer - 2015 Encounter for general adult medical examination without abnormal findings, Encounter for preventive health examination - 2015 Personal history of other diseases of the musculoskeletal system and connective tissue, History of arthritis - 2015    FAMILY HISTORY: Chronic Obstructive Pulmonary Disease - Runs In Family Death of family member - Runs In Family Hypertension - Runs In Family Kidney Stones - Runs In Family Parkinson's Disease - Runs In Family   SOCIAL HISTORY: None    Notes: Number of children, Caffeine use, Alcohol use, Non-smoker, Married   REVIEW OF SYSTEMS:    GU Review Female:   Patient denies frequent urination, hard to postpone urination, burning /pain with urination, get up at night to urinate, leakage of urine, stream starts and stops, trouble starting your stream, have to strain to urinate, and being pregnant.   Gastrointestinal (Upper):   Patient denies nausea, vomiting, and indigestion/ heartburn.  Gastrointestinal (Lower):   Patient denies diarrhea and constipation.  Constitutional:   Patient denies fever, night sweats, weight loss, and fatigue.  Skin:   Patient denies skin rash/ lesion and itching.  Eyes:   Patient denies blurred vision and double vision.  Ears/ Nose/ Throat:   Patient denies sore throat and sinus problems.  Hematologic/Lymphatic:   Patient denies swollen glands and easy bruising.  Cardiovascular:   Patient denies leg swelling and chest pains.  Respiratory:   Patient denies cough and shortness of breath.  Endocrine:   Patient denies excessive thirst.  Musculoskeletal:   Patient denies back pain and joint pain.  Neurological:   Patient denies headaches and dizziness.  Psychologic:   Patient denies depression and anxiety.   VITAL SIGNS:      06/08/2019 09:51 AM  Weight 174.3 lb / 79.06 kg  Height 64 in / 162.56 cm  BP 156/89 mmHg  Pulse 86 /min  Temperature 98.4 F / 36.8 C  BMI 29.9 kg/m  PAST DATA REVIEWED:  Source Of History:  Patient   PROCEDURES:         Flexible Cystoscopy - 52000  Risks, benefits, and some of the potential complications of the procedure were discussed at length with the patient including infection, bleeding, voiding discomfort, urinary retention, fever, chills, sepsis, and others. All questions were answered. Informed consent was obtained. Sterile technique and intraurethral analgesia were used.  Ureteral Orifices:  Normal location. Normal size. Normal shape. Effluxed clear urine.  Bladder:  No trabeculation. No tumors. Normal mucosa. No stones.      The lower urinary tract was carefully examined. There was no stitch, foreign, body, or carcinoma. The procedure was well-tolerated and without complications. Antibiotic instructions were given. Instructions were given to call the office immediately for bloody urine, difficulty urinating, urinary  retention, painful or frequent urination, fever, chills, nausea, vomiting or other illness. The patient stated that she understood these instructions and would comply with them.         Urinalysis w/Scope Dipstick Dipstick Cont'd Micro  Color: Yellow Bilirubin: Neg mg/dL WBC/hpf: 0 - 5/hpf  Appearance: Clear Ketones: Neg mg/dL RBC/hpf: 0 - 2/hpf  Specific Gravity: 1.015 Blood: 1+ ery/uL Bacteria: NS (Not Seen)  pH: <=5.0 Protein: Neg mg/dL Cystals: NS (Not Seen)  Glucose: Neg mg/dL Urobilinogen: 0.2 mg/dL Casts: NS (Not Seen)    Nitrites: Neg Trichomonas: Not Present    Leukocyte Esterase: Trace leu/uL Mucous: Not Present      Epithelial Cells: NS (Not Seen)      Yeast: NS (Not Seen)      Sperm: Not Present    ASSESSMENT:      ICD-10 Details  1 GU:   Renal cyst - N28.1   2   Cystocele, midline - N81.11               Notes:   We talked about a sling in detail. Pros, cons, general surgical and anesthetic risks, and other options including behavioral therapy and watchful waiting were discussed. She understands that slings are generally successful in 90% of cases for stress incontinence, 50% for urge incontinence, and that in a small % of cases the incontinence can worsen. The risk of persistent, de novo, or worsening incontinence/dysfunction was discussed. Risks were described but not limited to the discussion of injury to neighboring structures including the bowel (with possible life-threatening sepsis and colostomy), bladder, urethra, vagina (all resulting in further surgery), and ureter (resulting in re-implantation). We also talked about the risk of retention requiring urethrolysis, extrusion requiring revision, and erosion resulting in further surgery. Bleeding risks and transfusion rates and the risk of infection were discussed. The risk of pelvic and abdominal pain syndromes, dyspareunia, and neuropathies were discussed. The need for CIC was described as well the usual post-operative  course. The patient understands that she might not reach her treatment goal and that she might be worse following surgery.   After a thorough review of the management options for the patient's condition the patient  elected to proceed with surgical therapy as noted above. We have discussed the potential benefits and risks of the procedure, side effects of the proposed treatment, the likelihood of the patient achieving the goals of the procedure, and any potential problems that might occur during the procedure or recuperation. Informed consent has been obtained.

## 2019-09-01 NOTE — Transfer of Care (Signed)
Immediate Anesthesia Transfer of Care Note  Patient: Andrea Castro  Procedure(s) Performed: Procedure(s) (LRB): HYSTERECTOMY VAGINAL (N/A) ANTERIOR REPAIR (CYSTOCELE) (N/A) POSSIBLE POSTERIOR REPAIR (RECTOCELE) (N/A) PUBO-VAGINAL SLING (N/A)  Patient Location: PACU  Anesthesia Type: General  Level of Consciousness: awake, oriented, sedated and patient cooperative  Airway & Oxygen Therapy: Patient Spontanous Breathing and Patient connected to face mask oxygen  Post-op Assessment: Report given to PACU RN and Post -op Vital signs reviewed and stable  Post vital signs: Reviewed and stable  Complications: No apparent anesthesia complications  Last Vitals:  Vitals Value Taken Time  BP 140/81 09/01/19 1040  Temp    Pulse 91 09/01/19 1043  Resp 20 09/01/19 1043  SpO2 100 % 09/01/19 1043  Vitals shown include unvalidated device data.  Last Pain:  Vitals:   09/01/19 1040  TempSrc:   PainSc: Asleep      Patients Stated Pain Goal: 6 (09/01/19 0708)

## 2019-09-01 NOTE — Interval H&P Note (Signed)
History and Physical Interval Note:  09/01/2019 6:33 AM  Andrea Castro  has presented today for surgery, with the diagnosis of cystocele, STRESS INCONTINENCE.  The various methods of treatment have been discussed with the patient and family. After consideration of risks, benefits and other options for treatment, the patient has consented to  Procedure(s) with comments: HYSTERECTOMY VAGINAL (N/A) ANTERIOR REPAIR (CYSTOCELE) (N/A) POSSIBLE POSTERIOR REPAIR (RECTOCELE) (N/A) - possible PUBO-VAGINAL SLING (N/A) as a surgical intervention.  The patient's history has been reviewed, patient examined, no change in status, stable for surgery.  I have reviewed the patient's chart and labs.  Questions were answered to the patient's satisfaction.     Marquita Lias A Penina Reisner

## 2019-09-01 NOTE — Anesthesia Procedure Notes (Signed)
Procedure Name: Intubation Date/Time: 09/01/2019 7:58 AM Performed by: Suan Halter, CRNA Pre-anesthesia Checklist: Patient identified, Emergency Drugs available, Suction available and Patient being monitored Patient Re-evaluated:Patient Re-evaluated prior to induction Oxygen Delivery Method: Circle system utilized Preoxygenation: Pre-oxygenation with 100% oxygen Induction Type: IV induction Ventilation: Mask ventilation without difficulty Laryngoscope Size: Mac and 3 Grade View: Grade I Tube type: Oral Tube size: 7.0 mm Number of attempts: 1 Airway Equipment and Method: Stylet and Oral airway Placement Confirmation: ETT inserted through vocal cords under direct vision,  positive ETCO2 and breath sounds checked- equal and bilateral Secured at: 22 cm Tube secured with: Tape Dental Injury: Teeth and Oropharynx as per pre-operative assessment

## 2019-09-01 NOTE — Progress Notes (Signed)
Pt has little pain, Afebrile. BP remains elevated and pulse is 112 but was 106 on admission. Out put is excellent. Urine has no blood but is orange from the pyridium. Abdomen is soft and not tender. She has ambulated without difficulty.

## 2019-09-01 NOTE — Op Note (Signed)
NAME: Andrea Castro, Andrea Castro MEDICAL RECORD [id number] ACCOUNT [id number] DATE OF BIRTH:01-30-1956 FACILITY: [facility] LOCATION: [facility] PHYSICIAN:Saachi Zale Arlean Hopping, MD  OPERATIVE REPORT  DATE OF PROCEDURE:  09/01/2019  PREOPERATIVE DIAGNOSES:  Uterine prolapse, cystocele, stress urinary incontinence.  POSTOPERATIVE DIAGNOSES:  Uterine prolapse, cystocele, stress urinary incontinence.  OPERATION:  Vaginal hysterectomy, anterior repair.  Sling procedure was done by Dr. Lorra Hals.  DESCRIPTION OF PROCEDURE:  The patient was brought to the operating room and placed in the Kief.  A pad was placed close to her peroneal nerve at the knees.  She was placed in the lithotomy position.  Dr. Lorra Hals had the nurse shave the area  from the lower abdomen through the pubic area and around the vagina.  The vulva, vagina, and perineum were prepped with the Hibiclens soap then I inserted a catheter after prepping the urethra with Betadine.  Exam before the surgery began with the  patient anesthetized revealed the uterus to be small, anterior, freely movable.  There was a fourth degree cystocele.  After proper draping as a sterile field, a weighted speculum was placed posteriorly.  The cervix was grasped with 2 Lahey clamps drawn  into the operative field.  A dilute solution of epinephrine and lidocaine 1:200,000 epinephrine was injected at the cervicovaginal junction for hemostasis.  A circumferential incision was then made around the cervical opening with the knife.  Dissection  was carried out with the Mayo scissors.  Posterior peritoneum was identified, entered in a banana retractor was placed into the peritoneal cavity.  Before I identified the anterior peritoneum, suture ligated after clamping and cutting the uterosacral  ligaments and held the uterosacral ligaments, I then clamped, cut and suture ligated the cardinal ligaments.  At this point, I identified the anterior peritoneum.  I entered the  peritoneal cavity, retracted the bladder away, then clamped, cut, suture  ligated the uterine vessels bilaterally.  Uterus was inverted through the incision and the cul-de-sac.  The upper pedicles were clamped across and doubly suture ligated.  There was some bleeding on the right just inferior to the upper pedicles that was  taken care of with 0 Vicryl suture.I did not see the ovaries and I had informed the patient I would not make a great effort to find the tybes  All sutures at this point had been 0 Vicryl.  I then ran the posterior vaginal cuff incorporating the posterior vaginal mucosa with the peritoneum.  This running suture went from uterosacral ligament to uterosacral  ligament.  I then sutured the uterosacral ligaments together in the midline with two sutures of 0 Vicryl and tied the uterosacral ligaments that had been held together in the midline.  At this point, I began the anterior repair.  Dissected the vaginal  mucosa away from the cystocele all the way to about an inch from the urethral meatus.  I developed the cystocele imbricated the cystocele with interrupted sutures of 0 Vicryl, cut away redundant vaginal mucosa.  Then, I closed the vaginal mucosa starting  posteriorly.  I placed vaginal angle sutures and then sutured the rest of the vagina together in the midline with interrupted sutures of 0 Vicryl.  I left Dr. Lorra Hals a small area just under the urethral meatus to do his sling repair.  He came into  the room and did the sling repair.  I revisited the room with him in the surgeons chair.  He confirmed hemostasis of the vagina.  He was going to close the incisions that  he had made in the small amount of vaginal mucosa that I had left in case he needed  an opening.  The blood loss for my procedure was 200 mL.  He will dictate his procedure separately.  The patient is near being awakened and hopefully she will return to the recovery room in satisfactory condition.I needed a first  assistant MD because of the complexity of the case.  TN/NUANCE  D:09/01/2019 T:09/01/2019 JOB:[id number]

## 2019-09-01 NOTE — Progress Notes (Signed)
I examined this lady 09-01-19 and she states there has been no change in her health since that time.

## 2019-09-02 ENCOUNTER — Encounter (HOSPITAL_BASED_OUTPATIENT_CLINIC_OR_DEPARTMENT_OTHER): Payer: Self-pay | Admitting: Obstetrics and Gynecology

## 2019-09-02 DIAGNOSIS — N814 Uterovaginal prolapse, unspecified: Secondary | ICD-10-CM | POA: Diagnosis not present

## 2019-09-02 DIAGNOSIS — Z853 Personal history of malignant neoplasm of breast: Secondary | ICD-10-CM | POA: Diagnosis not present

## 2019-09-02 DIAGNOSIS — M1991 Primary osteoarthritis, unspecified site: Secondary | ICD-10-CM | POA: Diagnosis not present

## 2019-09-02 DIAGNOSIS — N393 Stress incontinence (female) (male): Secondary | ICD-10-CM | POA: Diagnosis not present

## 2019-09-02 DIAGNOSIS — N8111 Cystocele, midline: Secondary | ICD-10-CM | POA: Diagnosis not present

## 2019-09-02 LAB — CBC
HCT: 34.8 % — ABNORMAL LOW (ref 36.0–46.0)
Hemoglobin: 11.2 g/dL — ABNORMAL LOW (ref 12.0–15.0)
MCH: 28.6 pg (ref 26.0–34.0)
MCHC: 32.2 g/dL (ref 30.0–36.0)
MCV: 89 fL (ref 80.0–100.0)
Platelets: 159 10*3/uL (ref 150–400)
RBC: 3.91 MIL/uL (ref 3.87–5.11)
RDW: 13.5 % (ref 11.5–15.5)
WBC: 7 10*3/uL (ref 4.0–10.5)
nRBC: 0 % (ref 0.0–0.2)

## 2019-09-02 LAB — SURGICAL PATHOLOGY

## 2019-09-02 MED ORDER — ENOXAPARIN SODIUM 40 MG/0.4ML ~~LOC~~ SOLN
SUBCUTANEOUS | Status: AC
  Filled 2019-09-02: qty 0.4

## 2019-09-02 MED ORDER — OXYCODONE HCL 5 MG PO TABS
ORAL_TABLET | ORAL | Status: AC
  Filled 2019-09-02: qty 2

## 2019-09-02 MED ORDER — IBUPROFEN 800 MG PO TABS: 800.0000 mg | ORAL_TABLET | Freq: Three times a day (TID) | ORAL | 1 refills | Status: DC | PRN

## 2019-09-02 MED ORDER — IBUPROFEN 800 MG PO TABS
ORAL_TABLET | ORAL | Status: AC
  Filled 2019-09-02: qty 1

## 2019-09-02 MED FILL — IBUPROFEN 800 MG TAB: 800 | 6 days supply | Qty: 20 | Fill #0

## 2019-09-02 NOTE — Progress Notes (Signed)
Good progress Labs normal and vital normal Void trial  Post op detailed

## 2019-09-02 NOTE — Progress Notes (Signed)
#  1 afebrile. BP normal this AM and pulse normal. HGB 13.4 pre op and now 11.2. Output excellent.Abdomen soft and not tender. Pack out. Minimal stain. Pt has ambulated without difficulty. She is ready for discharge.

## 2019-09-02 NOTE — Anesthesia Postprocedure Evaluation (Signed)
Anesthesia Post Note  Patient: Andrea Castro  Procedure(s) Performed: HYSTERECTOMY VAGINAL (N/A Vagina ) ANTERIOR REPAIR (CYSTOCELE) (N/A Vagina ) POSSIBLE POSTERIOR REPAIR (RECTOCELE) (N/A Vagina ) PUBO-VAGINAL SLING (N/A Vagina )     Patient location during evaluation: PACU Anesthesia Type: General Level of consciousness: awake and alert Pain management: pain level controlled Vital Signs Assessment: post-procedure vital signs reviewed and stable Respiratory status: spontaneous breathing, nonlabored ventilation, respiratory function stable and patient connected to nasal cannula oxygen Cardiovascular status: blood pressure returned to baseline and stable Postop Assessment: no apparent nausea or vomiting Anesthetic complications: no    Last Vitals:  Vitals:   09/02/19 0202 09/02/19 0600  BP: (!) 148/79 138/74  Pulse: 95 84  Resp: (!) 22 16  Temp: 37.1 C 36.9 C  SpO2: 96% 97%    Last Pain:  Vitals:   09/02/19 0602  TempSrc:   PainSc: 2                  Audry Pili

## 2019-09-02 NOTE — Discharge Summary (Signed)
NAME: Andrea Castro, Andrea Castro MEDICAL RECORD [id number] ACCOUNT [id number] DATE OF BIRTH:1955/12/19 FACILITY: [facility] LOCATION: [facility] PHYSICIAN:Icy Fuhrmann Arlean Hopping, MD  DISCHARGE SUMMARY  DATE OF DISCHARGE:  09/02/2019  This is a 63 year old white female who was admitted to the hospital for vaginal hysterectomy, anterior repair, and sling procedure by Dr. Matilde Sprang.  Patient underwent a vaginal hysterectomy and anterior repair by Dr. Ulanda Edison and then a sling procedure  by Dr. Matilde Sprang on 09/01/2019.  She did well postoperatively.  She had no vaginal bleeding.  A vaginal pack was in place.  She had good urinary output.  The urine was stained with Pyridium, but otherwise was clear.  She passed flatus.  Her abdomen  remained soft and nontender.  The pack was removed on the 1st postop day, it was minimally stained and she was ready for discharge.  She had been treated with prophylactic Kefzol and with 40 mg of Lovenox preop and then the morning of discharge.  Her  initial hemoglobin was 13.4, hematocrit 41.9, white count 7200.  Postop the day after surgery, hemoglobin was 11.2, hematocrit 34.8, white count 7000.  With excellent urine output, soft, nontender abdomen, the hemoglobin was not considered to represent  significant blood loss.  The patient was ready for discharge on the 1st postop morning.  Her catheter had been removed.  Dr. Matilde Sprang has given instructions to her.  If the residual urine is too high, she will need to self-cath at home.  I have advised  her to resume all home medications except ibuprofen 200 mg because I have given her a prescription for ibuprofen 800, 20 tablets one 3x a day as needed for pain with 1 refill and I have advised her to come back to my office in 2 weeks for followup  examination.  She is given discharge instructions including no vaginal entrance, no heavy lifting or strenuous activity.  Call with any temperature elevation greater than 100.4 degrees.  Call with any  unusual problems.  Any organ system that seems to be  awry she is to call and inform us.  I have advised her that I am not on call.  I do not do obstetrics; I cannot take calls so if she were to have an emergency, she would need to call the doctor on call for our practice.  She understands this and agrees  to follow my instructions and return in 2 weeks.  Pathology report is pending.  CN/NUANCE D:09/02/2019 T:09/02/2019 JOB:[id number]

## 2019-09-02 NOTE — Progress Notes (Signed)
Spoke with Dr Ulanda Edison this morning and clarified that he will take out the vaginal packing when he rounds, he does want the patient to get the scheduled  lovenox injection this morning and that nursing staff should check with Dr. Matilde Sprang regarding removing the foley cath this morning. Will continue to monitor patient.

## 2019-09-09 DIAGNOSIS — N3946 Mixed incontinence: Secondary | ICD-10-CM | POA: Diagnosis not present

## 2019-09-09 DIAGNOSIS — N8111 Cystocele, midline: Secondary | ICD-10-CM | POA: Diagnosis not present

## 2019-09-13 MED FILL — IBUPROFEN 800 MG TAB: 800 | 6 days supply | Qty: 20 | Fill #1

## 2019-09-27 ENCOUNTER — Other Ambulatory Visit: Payer: Self-pay

## 2019-09-28 ENCOUNTER — Encounter: Payer: Self-pay | Admitting: Family Medicine

## 2019-09-28 ENCOUNTER — Ambulatory Visit (INDEPENDENT_AMBULATORY_CARE_PROVIDER_SITE_OTHER): Payer: 59

## 2019-09-28 ENCOUNTER — Ambulatory Visit: Payer: 59 | Admitting: Family Medicine

## 2019-09-28 VITALS — BP 140/87 | HR 101 | Temp 99.6°F | Resp 20 | Ht 63.0 in | Wt 158.0 lb

## 2019-09-28 DIAGNOSIS — Z1231 Encounter for screening mammogram for malignant neoplasm of breast: Secondary | ICD-10-CM

## 2019-09-28 DIAGNOSIS — I10 Essential (primary) hypertension: Secondary | ICD-10-CM | POA: Diagnosis not present

## 2019-09-28 DIAGNOSIS — Z853 Personal history of malignant neoplasm of breast: Secondary | ICD-10-CM | POA: Diagnosis not present

## 2019-09-28 DIAGNOSIS — H5702 Anisocoria: Secondary | ICD-10-CM | POA: Insufficient documentation

## 2019-09-28 DIAGNOSIS — R7989 Other specified abnormal findings of blood chemistry: Secondary | ICD-10-CM

## 2019-09-28 DIAGNOSIS — Z6827 Body mass index (BMI) 27.0-27.9, adult: Secondary | ICD-10-CM | POA: Insufficient documentation

## 2019-09-28 DIAGNOSIS — R748 Abnormal levels of other serum enzymes: Secondary | ICD-10-CM | POA: Diagnosis not present

## 2019-09-28 DIAGNOSIS — R030 Elevated blood-pressure reading, without diagnosis of hypertension: Secondary | ICD-10-CM | POA: Diagnosis not present

## 2019-09-28 MED ORDER — CHLORTHALIDONE 25 MG PO TABS: 25.0000 mg | ORAL_TABLET | Freq: Every day | ORAL | 3 refills | Status: DC

## 2019-09-28 MED FILL — CHLORTHALIDONE 25 MG TABS: 25 | 30 days supply | Qty: 30 | Fill #0

## 2019-09-28 NOTE — Patient Instructions (Signed)
DASH Eating Plan DASH stands for "Dietary Approaches to Stop Hypertension." The DASH eating plan is a healthy eating plan that has been shown to reduce high blood pressure (hypertension). Additional health benefits may include reducing the risk of type 2 diabetes mellitus, heart disease, and stroke. The DASH eating plan may also help with weight loss.  WHAT DO I NEED TO KNOW ABOUT THE DASH EATING PLAN? For the DASH eating plan, you will follow these general guidelines:  Choose foods with a percent daily value for sodium of less than 5% (as listed on the food label).  Use salt-free seasonings or herbs instead of table salt or sea salt.  Check with your health care provider or pharmacist before using salt substitutes.  Eat lower-sodium products, often labeled as "lower sodium" or "no salt added."  Eat fresh foods.  Eat more vegetables, fruits, and low-fat dairy products.  Choose whole grains. Look for the word "whole" as the first word in the ingredient list.  Choose fish and skinless chicken or turkey more often than red meat. Limit fish, poultry, and meat to 6 oz (170 g) each day.  Limit sweets, desserts, sugars, and sugary drinks.  Choose heart-healthy fats.  Limit cheese to 1 oz (28 g) per day.  Eat more home-cooked food and less restaurant, buffet, and fast food.  Limit fried foods.  Cook foods using methods other than frying.  Limit canned vegetables. If you do use them, rinse them well to decrease the sodium.  When eating at a restaurant, ask that your food be prepared with less salt, or no salt if possible.  WHAT FOODS CAN I EAT? Seek help from a dietitian for individual calorie needs.  Grains Whole grain or whole wheat bread. Brown rice. Whole grain or whole wheat pasta. Quinoa, bulgur, and whole grain cereals. Low-sodium cereals. Corn or whole wheat flour tortillas. Whole grain cornbread. Whole grain crackers. Low-sodium crackers.  Vegetables Fresh or frozen  vegetables (raw, steamed, roasted, or grilled). Low-sodium or reduced-sodium tomato and vegetable juices. Low-sodium or reduced-sodium tomato sauce and paste. Low-sodium or reduced-sodium canned vegetables.   Fruits All fresh, canned (in natural juice), or frozen fruits.  Meat and Other Protein Products Ground beef (85% or leaner), grass-fed beef, or beef trimmed of fat. Skinless chicken or turkey. Ground chicken or turkey. Pork trimmed of fat. All fish and seafood. Eggs. Dried beans, peas, or lentils. Unsalted nuts and seeds. Unsalted canned beans.  Dairy Low-fat dairy products, such as skim or 1% milk, 2% or reduced-fat cheeses, low-fat ricotta or cottage cheese, or plain low-fat yogurt. Low-sodium or reduced-sodium cheeses.  Fats and Oils Tub margarines without trans fats. Light or reduced-fat mayonnaise and salad dressings (reduced sodium). Avocado. Safflower, olive, or canola oils. Natural peanut or almond butter.  Other Unsalted popcorn and pretzels. The items listed above may not be a complete list of recommended foods or beverages. Contact your dietitian for more options.  WHAT FOODS ARE NOT RECOMMENDED?  Grains White bread. White pasta. White rice. Refined cornbread. Bagels and croissants. Crackers that contain trans fat.  Vegetables Creamed or fried vegetables. Vegetables in a cheese sauce. Regular canned vegetables. Regular canned tomato sauce and paste. Regular tomato and vegetable juices.  Fruits Dried fruits. Canned fruit in light or heavy syrup. Fruit juice.  Meat and Other Protein Products Fatty cuts of meat. Ribs, chicken wings, bacon, sausage, bologna, salami, chitterlings, fatback, hot dogs, bratwurst, and packaged luncheon meats. Salted nuts and seeds. Canned beans with salt.    Dairy Whole or 2% milk, cream, half-and-half, and cream cheese. Whole-fat or sweetened yogurt. Full-fat cheeses or blue cheese. Nondairy creamers and whipped toppings. Processed cheese,  cheese spreads, or cheese curds.  Condiments Onion and garlic salt, seasoned salt, table salt, and sea salt. Canned and packaged gravies. Worcestershire sauce. Tartar sauce. Barbecue sauce. Teriyaki sauce. Soy sauce, including reduced sodium. Steak sauce. Fish sauce. Oyster sauce. Cocktail sauce. Horseradish. Ketchup and mustard. Meat flavorings and tenderizers. Bouillon cubes. Hot sauce. Tabasco sauce. Marinades. Taco seasonings. Relishes.  Fats and Oils Butter, stick margarine, lard, shortening, ghee, and bacon fat. Coconut, palm kernel, or palm oils. Regular salad dressings.  Other Pickles and olives. Salted popcorn and pretzels.  The items listed above may not be a complete list of foods and beverages to avoid. Contact your dietitian for more information.  WHERE CAN I FIND MORE INFORMATION? National Heart, Lung, and Blood Institute: www.nhlbi.nih.gov/health/health-topics/topics/dash/ Document Released: 09/25/2011 Document Revised: 02/20/2014 Document Reviewed: 08/10/2013 ExitCare Patient Information 2015 [employer]. This information is not intended to replace advice given to you by your health care provider. Make sure you discuss any questions you have with your health care provider.   I think that you would greatly benefit from seeing a nutritionist.  If you are interested, please call Dr Sykes at [phone] to schedule an appointment.   

## 2019-09-28 NOTE — Progress Notes (Signed)
Subjective:  Patient ID: Andrea Castro, female    DOB: 07/06/1956, 63 y.o.   MRN: [id number]  Patient Care Team: Baruch Gouty, FNP as PCP - General (Family Medicine) Eston Esters, MD (Inactive) (Hematology and Oncology)   Chief Complaint:  Establish Care   HPI: Andrea Castro is a 63 y.o. female presenting on 09/28/2019 for Establish Care   Pt presents today to establish care. She has not been seen by a PCP in a very long time. She has been followed by GYN and urology on a regular basis. She had a hysterectomy and sling procedure last month due to cystocele. She tolerated the procedure well and has been doing great since discharge home. She has a history of left breast cancer in 2011. She underwent radiation, chemo, and bilateral mastectomy. States she was released by oncology several years ago. She has not had a follow up mammogram since being discharged from oncology. She states her GYN has mentioned her blood pressure being elevated over the last several months and she is concerned about this. She is not on medications and has not been in the past. She has lost 43 pounds and is working on additional weight loss through diet and exercise. States she does not have any cheat pain, shortness of breath, palpitations, dizziness, weakness, or syncope. No leg swelling. She denies complaints or concerns.      Relevant past medical, surgical, family, and social history reviewed and updated as indicated.  Allergies and medications reviewed and updated. Date reviewed: Chart in Epic.   Past Medical History:  Diagnosis Date  . ACL (anterior cruciate ligament) tear 2002  . Arthritis   . Atypical nevus 05/27/1996   Right Rim Ear-Slight  . Cancer [facility]) 2011   left breast  . Complication of anesthesia   . History of breast cancer   . Hyphema of right eye   . PONV (postoperative nausea and vomiting)   . Ruptured disc, thoracic 08/1983    Past Surgical History:  Procedure Laterality  Date  . BACK SURGERY  1984   ruptured disc  . CYSTOCELE REPAIR N/A 09/01/2019   Procedure: ANTERIOR REPAIR (CYSTOCELE);  Surgeon: Newton Pigg, MD;  [facility];  Service: Gynecology;  Laterality: N/A;  . HERNIA REPAIR  02/20/11   ventral hernia  . KNEE ARTHROSCOPY Left 08/17/2015   Procedure: Left Knee Arthroscopy and Debridement;  Surgeon: Newt Minion, MD;  Location: [place];  Service: Orthopedics;  Laterality: Left;  . KNEE ARTHROSCOPY WITH ANTERIOR CRUCIATE LIGAMENT (ACL) REPAIR Left 03/2010  . MASTECTOMY  10/30/10   right, total  . MASTECTOMY  05/22/10   left cancer/chemo and radiation  . nipple construction  08/2011   tattoo at another date  . PORT-A-CATH REMOVAL  01/09/2012   Procedure: MINOR REMOVAL PORT-A-CATH;  Surgeon: Haywood Lasso, MD;  Location: [place];  Service: General;  Laterality: N/A;  . PORTACATH PLACEMENT  12/17/09  . PUBOVAGINAL SLING N/A 09/01/2019   Procedure: Gaynelle Arabian;  Surgeon: Bjorn Loser, MD;  Location: [facility];  Service: Urology;  Laterality: N/A;  . RECONSTRUCTION BREAST W/ TRAM FLAP  02/20/11   bilateral  . RECTOCELE REPAIR N/A 09/01/2019   Procedure: POSSIBLE POSTERIOR REPAIR (RECTOCELE);  Surgeon: Newton Pigg, MD;  Location: [facility];  Service: Gynecology;  Laterality: N/A;  possible  . torn ligament  2002   left knee  . TUBAL LIGATION  2004  .  VAGINAL HYSTERECTOMY N/A 09/01/2019   Procedure: HYSTERECTOMY VAGINAL;  Surgeon: Newton Pigg, MD;  Location: [facility];  Service: Gynecology;  Laterality: N/A;    Social History   Socioeconomic History  . Marital status: Married    Spouse name: Not on file  . Number of children: Not on file  . Years of education: Not on file  . Highest education level: Not on file  Occupational History  . Not on file  Social Needs  . Financial resource strain: Not on file  . Food insecurity    Worry:  Not on file    Inability: Not on file  . Transportation needs    Medical: Not on file    Non-medical: Not on file  Tobacco Use  . Smoking status: Never Smoker  . Smokeless tobacco: Never Used  Substance and Sexual Activity  . Alcohol use: No  . Drug use: No  . Sexual activity: Yes    Birth control/protection: Post-menopausal  Lifestyle  . Physical activity    Days per week: Not on file    Minutes per session: Not on file  . Stress: Not on file  Relationships  . Social Herbalist on phone: Not on file    Gets together: Not on file    Attends religious service: Not on file    Active member of club or organization: Not on file    Attends meetings of clubs or organizations: Not on file    Relationship status: Not on file  . Intimate partner violence    Fear of current or ex partner: Not on file    Emotionally abused: Not on file    Physically abused: Not on file    Forced sexual activity: Not on file  Other Topics Concern  . Not on file  Social History Narrative  . Not on file    Outpatient Encounter Medications as of 09/28/2019  Medication Sig  . Biotin 2500 MCG CAPS Take 5,000 mcg by mouth daily.   . calcium-vitamin D (OSCAL WITH D) 500-200 MG-UNIT per tablet Take 1 tablet by mouth 2 (two) times daily.   . ferrous sulfate 325 (65 FE) MG tablet Take 325 mg by mouth daily with breakfast.  . fish oil-omega-3 fatty acids 1000 MG capsule Take 1 g by mouth 2 (two) times daily.   . Glucosamine HCl 1000 MG TABS Take 1,000 mg by mouth 2 (two) times daily.   Marland Kitchen ibuprofen (ADVIL) 800 MG tablet Take 1 tablet (800 mg total) by mouth every 8 (eight) hours as needed.  . loratadine (CLARITIN) 10 MG tablet Take 10 mg by mouth daily as needed for allergies.  . Multiple Vitamin (MULTIVITAMIN PO) Take 1 tablet by mouth daily.   . Probiotic Product (PROBIOTIC DAILY PO) Take 1 capsule by mouth daily.  Marland Kitchen zinc gluconate 50 MG tablet Take 50 mg by mouth daily.  Marland Kitchen aspirin EC 81 MG tablet  Take 1 tablet (81 mg total) by mouth daily. (Patient not taking: Reported on 09/28/2019)  . chlorthalidone (HYGROTON) 25 MG tablet Take 1 tablet (25 mg total) by mouth daily.  . [DISCONTINUED] b complex vitamins tablet Take 1 tablet by mouth daily.   No facility-administered encounter medications on file as of 09/28/2019.     No Known Allergies  Review of Systems  Constitutional: Negative for activity change, appetite change, chills, diaphoresis, fatigue, fever and unexpected weight change.  HENT: Negative.   Eyes: Negative.  Negative for photophobia  and visual disturbance.  Respiratory: Negative for cough, chest tightness and shortness of breath.   Cardiovascular: Negative for chest pain, palpitations and leg swelling.  Gastrointestinal: Negative for abdominal pain, blood in stool, constipation, diarrhea, nausea and vomiting.  Endocrine: Negative.  Negative for cold intolerance, heat intolerance, polydipsia, polyphagia and polyuria.  Genitourinary: Negative for decreased urine volume, difficulty urinating, dysuria, flank pain, frequency, urgency, vaginal bleeding and vaginal pain.  Musculoskeletal: Negative for arthralgias and myalgias.  Skin: Negative.  Negative for color change and rash.  Allergic/Immunologic: Negative.   Neurological: Negative for dizziness, tremors, seizures, syncope, facial asymmetry, speech difficulty, weakness, light-headedness, numbness and headaches.  Hematological: Negative.   Psychiatric/Behavioral: Negative for confusion, hallucinations, sleep disturbance and suicidal ideas.  All other systems reviewed and are negative.       Objective:  BP 140/87   Pulse (!) 101   Temp 99.6 F (37.6 C) (Temporal)   Resp 20   Ht _0  (1.6 m)   Wt 158 lb (71.7 kg)   SpO2 96%   BMI 27.99 kg/m    Wt Readings from Last 3 Encounters:  09/28/19 158 lb (71.7 kg)  09/01/19 159 lb (72.1 kg)  08/29/19 160 lb (72.6 kg)    Physical Exam Vitals signs and nursing note  reviewed.  Constitutional:      General: She is not in acute distress.    Appearance: Normal appearance. She is well-developed and well-groomed. She is not ill-appearing, toxic-appearing or diaphoretic.  HENT:     Head: Normocephalic and atraumatic.     Jaw: There is normal jaw occlusion.     Right Ear: Hearing normal.     Left Ear: Hearing normal.     Nose: Nose normal.     Mouth/Throat:     Lips: Pink.     Mouth: Mucous membranes are moist.     Pharynx: Oropharynx is clear. Uvula midline.  Eyes:     General: Lids are normal.     Extraocular Movements: Extraocular movements intact.     Conjunctiva/sclera: Conjunctivae normal.     Pupils: Pupils are equal, round, and reactive to light.  Neck:     Musculoskeletal: Normal range of motion and neck supple.     Thyroid: No thyroid mass, thyromegaly or thyroid tenderness.     Vascular: No carotid bruit or JVD.     Trachea: Trachea and phonation normal.  Cardiovascular:     Rate and Rhythm: Normal rate and regular rhythm.     Chest Wall: PMI is not displaced.     Pulses: Normal pulses.     Heart sounds: Normal heart sounds. No murmur. No friction rub. No gallop.   Pulmonary:     Effort: Pulmonary effort is normal. No respiratory distress.     Breath sounds: Normal breath sounds. No wheezing.  Chest:     Comments: Bilateral mastectomy with reconstruction surgery / implants Abdominal:     General: Bowel sounds are normal. There is no distension or abdominal bruit.     Palpations: Abdomen is soft. There is no hepatomegaly or splenomegaly.     Tenderness: There is no abdominal tenderness. There is no right CVA tenderness or left CVA tenderness.     Hernia: No hernia is present.  Musculoskeletal: Normal range of motion.     Right lower leg: No edema.     Left lower leg: No edema.  Lymphadenopathy:     Cervical: No cervical adenopathy.  Skin:    General: Skin is  warm and dry.     Capillary Refill: Capillary refill takes less than 2  seconds.     Coloration: Skin is not cyanotic, jaundiced or pale.     Findings: No rash.  Neurological:     General: No focal deficit present.     Mental Status: She is alert and oriented to person, place, and time.     Cranial Nerves: Cranial nerves are intact. No cranial nerve deficit.     Sensory: Sensation is intact. No sensory deficit.     Motor: Motor function is intact. No weakness.     Coordination: Coordination is intact. Coordination normal.     Gait: Gait is intact. Gait normal.     Deep Tendon Reflexes: Reflexes are normal and symmetric. Reflexes normal.  Psychiatric:        Attention and Perception: Attention and perception normal.        Mood and Affect: Mood and affect normal.        Speech: Speech normal.        Behavior: Behavior normal. Behavior is cooperative.        Thought Content: Thought content normal.        Cognition and Memory: Cognition and memory normal.        Judgment: Judgment normal.     Results for orders placed or performed during the hospital encounter of 09/01/19  CBC  Result Value Ref Range   WBC 7.0 4.0 - 10.5 K/uL   RBC 3.91 3.87 - 5.11 MIL/uL   Hemoglobin 11.2 (L) 12.0 - 15.0 g/dL   HCT 34.8 (L) 36.0 - 46.0 %   MCV 89.0 80.0 - 100.0 fL   MCH 28.6 26.0 - 34.0 pg   MCHC 32.2 30.0 - 36.0 g/dL   RDW 13.5 11.5 - 15.5 %   Platelets 159 150 - 400 K/uL   nRBC 0.0 0.0 - 0.2 %  Surgical pathology  Result Value Ref Range   SURGICAL PATHOLOGY      SURGICAL PATHOLOGY CASE: [id number] PATIENT: Chey Glanzer Surgical Pathology Report     Clinical History: Cystocele, stress incontinence (cm)     FINAL MICROSCOPIC DIAGNOSIS:  A. UTERUS AND CERVIX, HYSTERECTOMY: - Uterus with benign inactive endometrium - Benign unremarkable cervix - No evidence of malignancy    GROSS DESCRIPTION:  Specimen: Received in formalin labeled uterus and cervix Specimen integrity: An intact uterus and cervix without adnexa Size and shape: Symmetrical  uterus measuring 6.1 cm fundus to cervix by 3.5 cm cornu to cornu by 2.3 cm anterior to posterior Weight: 30 g Serosa: Tan-pink, hyperemic, and focally disrupted on the anterior surface Cervix: Ectocervix is tan-pink, smooth, glistening, and measures 2.7 x 2.0 cm with a 1.1 cm slitlike cervical os.  Endocervical canal measures 2.0 cm in length, and is tan-pink and corrugated.  Cervical stroma is tan-white and fibrous. Endometrium: Triangular endometrial cavity measures 2.8 x  1.9 cm. Endometrium is tan and hyperemic.  Endometrium measures 0.1 cm in thickness. Myometrium: Myometrium is tan-pink and trabeculated, and no intramural lesions are grossly identified. Block Summary: 6 blocks submitted 1 = anterior cervix 2 = posterior cervix 3 and 4 = anterior endomyometrium 5 and 6 = posterior endomyometrium Craig Staggers 09/02/2019)     Final Diagnosis performed by Jaquita Folds, MD.   Electronically signed 09/02/2019 Technical component performed at [facility], [place] [address], [place], [place] [address].  Professional component performed at [employer]. [facility], [place] [address] [place], [place]  [address].  Immunohistochemistry Technical component (if applicable) was performed at [facility]. [address] [place], [place], [place] [address].   IMMUNOHISTOCHEMISTRY DISCLAIMER (if applicable): Some of these immunohistochemical stains may have been developed and the performance characteristics det ermine by [facility]. Some may not have been cleared or approved by the U.S. Food and Drug Administration. The FDA has determined that such clearance or approval is not necessary. This test is used for clinical purposes. It should not be regarded as investigational or for research. This laboratory is certified under the [place] (CLIA-88) as qualified to perform high complexity clinical  laboratory testing.  The controls stained appropriately.      EKG with slight left axis deviation, no acute changes. Monia Pouch, FNP-C.   X-Ray: CXR: No acute findings. Preliminary x-ray reading by Monia Pouch, FNP-C, [facility].  Pertinent labs & imaging results that were available during my care of the patient were reviewed by me and considered in my medical decision making.  Assessment & Plan:  Josslynn was seen today for establish care.  Diagnoses and all orders for this visit:  BMI 27.0-27.9,adult Diet and exercise encouraged. Labs pending.  -     CMP14+EGFR -     CBC with Differential/Platelet -     Lipid panel -     Thyroid Panel With TSH  Essential hypertension DASH diet and exercise encouraged. EKG with changes concerning for LVH. Will place referral to cardiology to get established. Labs pending. CXR normal. Will initiate chlorthalidone. Repeat labs and BP in 2 weeks.  -     CMP14+EGFR -     CBC with Differential/Platelet -     Lipid panel -     Thyroid Panel With TSH -     EKG 12-Lead -     chlorthalidone (HYGROTON) 25 MG tablet; Take 1 tablet (25 mg total) by mouth daily. -     DG Chest 2 View; Future  History of breast cancer CXR normal. Mammogram ordered.  -     MM DIAG BREAST W/IMPLANT TOMO BILATERAL; Future -     DG Chest 2 View; Future  Encounter for screening mammogram for malignant neoplasm of breast -     MM DIAG BREAST W/IMPLANT TOMO BILATERAL; Future     Continue all other maintenance medications.  Follow up plan: Return in about 2 weeks (around 10/12/2019), or if symptoms worsen or fail to improve, for HTN.  Continue healthy lifestyle choices, including diet (rich in fruits, vegetables, and lean proteins, and low in salt and simple carbohydrates) and exercise (at least 30 minutes of moderate physical activity daily).  Educational handout given for DASH diet  The above assessment and management plan was discussed with the patient. The patient  verbalized understanding of and has agreed to the management plan. Patient is aware to call the clinic if they develop any new symptoms or if symptoms persist or worsen. Patient is aware when to return to the clinic for a follow-up visit. Patient educated on when it is appropriate to go to the emergency department.   Monia Pouch, FNP-C [facility] [phone]

## 2019-09-29 LAB — CBC WITH DIFFERENTIAL/PLATELET
Basophils Absolute: 0 10*3/uL (ref 0.0–0.2)
Basos: 0 %
EOS (ABSOLUTE): 0.1 10*3/uL (ref 0.0–0.4)
Eos: 1 %
Hematocrit: 38.3 % (ref 34.0–46.6)
Hemoglobin: 13 g/dL (ref 11.1–15.9)
Immature Grans (Abs): 0 10*3/uL (ref 0.0–0.1)
Immature Granulocytes: 0 %
Lymphocytes Absolute: 2.3 10*3/uL (ref 0.7–3.1)
Lymphs: 37 %
MCH: 28.4 pg (ref 26.6–33.0)
MCHC: 33.9 g/dL (ref 31.5–35.7)
MCV: 84 fL (ref 79–97)
Monocytes Absolute: 0.7 10*3/uL (ref 0.1–0.9)
Monocytes: 11 %
Neutrophils Absolute: 3.2 10*3/uL (ref 1.4–7.0)
Neutrophils: 51 %
Platelets: 229 10*3/uL (ref 150–450)
RBC: 4.57 x10E6/uL (ref 3.77–5.28)
RDW: 13 % (ref 11.7–15.4)
WBC: 6.1 10*3/uL (ref 3.4–10.8)

## 2019-09-29 LAB — CMP14+EGFR
ALT: 41 IU/L — ABNORMAL HIGH (ref 0–32)
AST: 25 IU/L (ref 0–40)
Albumin/Globulin Ratio: 1.2 (ref 1.2–2.2)
Albumin: 3.6 g/dL — ABNORMAL LOW (ref 3.8–4.8)
Alkaline Phosphatase: 236 IU/L — ABNORMAL HIGH (ref 39–117)
BUN/Creatinine Ratio: 31 — ABNORMAL HIGH (ref 12–28)
BUN: 17 mg/dL (ref 8–27)
Bilirubin Total: 0.5 mg/dL (ref 0.0–1.2)
CO2: 24 mmol/L (ref 20–29)
Calcium: 10.2 mg/dL (ref 8.7–10.3)
Chloride: 107 mmol/L — ABNORMAL HIGH (ref 96–106)
Creatinine, Ser: 0.54 mg/dL — ABNORMAL LOW (ref 0.57–1.00)
GFR calc Af Amer: 117 mL/min/{1.73_m2} (ref >59)
GFR calc non Af Amer: 101 mL/min/{1.73_m2} (ref >59)
Globulin, Total: 3.1 g/dL (ref 1.5–4.5)
Glucose: 98 mg/dL (ref 65–99)
Potassium: 4.1 mmol/L (ref 3.5–5.2)
Sodium: 143 mmol/L (ref 134–144)
Total Protein: 6.7 g/dL (ref 6.0–8.5)

## 2019-09-29 LAB — THYROID PANEL WITH TSH
Free Thyroxine Index: >12.9 — ABNORMAL HIGH (ref 1.2–4.9)
T3 Uptake Ratio: >56 % — ABNORMAL HIGH (ref 24–39)
T4, Total: 23.1 ug/dL (ref 4.5–12.0)
TSH: <0.005 u[IU]/mL — ABNORMAL LOW (ref 0.450–4.500)

## 2019-09-29 LAB — LIPID PANEL
Chol/HDL Ratio: 2.3 ratio (ref 0.0–4.4)
Cholesterol, Total: 143 mg/dL (ref 100–199)
HDL: 61 mg/dL (ref >39)
LDL Chol Calc (NIH): 67 mg/dL (ref 0–99)
Triglycerides: 75 mg/dL (ref 0–149)
VLDL Cholesterol Cal: 15 mg/dL (ref 5–40)

## 2019-09-29 NOTE — Progress Notes (Signed)
Normal chest x-ray.

## 2019-09-30 NOTE — Addendum Note (Signed)
Addended by: Baruch Gouty on: 09/30/2019 12:21 PM   Modules accepted: Orders

## 2019-09-30 NOTE — Progress Notes (Signed)
Kidney function is normal.  Liver function is elevated, the alk phos is elevated at 236. I want to recheck this fasting along with a GGT and isoenzymes. You can return to the office to have this done.  Fasting sugar is normal.  Thyroid function abnormal. Your TSH is low and your T4 total and T3 is high. I want to recheck these along with thyroid antibodies.  Cholesterol is normal.  CBC is normal.  

## 2019-10-03 ENCOUNTER — Telehealth: Payer: Self-pay | Admitting: Family Medicine

## 2019-10-03 NOTE — Telephone Encounter (Signed)
Patient wants lab results. Pt was started on Chlorthalidone. She states since she started it she just feels will. She can not describe it. So she stopped taking it. Please advise.

## 2019-10-03 NOTE — Telephone Encounter (Signed)
Aware.  Viewed in my chart.

## 2019-10-03 NOTE — Telephone Encounter (Signed)
Patient wants to speak with nurse regarding her lab results and also her medication.

## 2019-10-11 ENCOUNTER — Other Ambulatory Visit: Payer: Self-pay

## 2019-10-11 ENCOUNTER — Other Ambulatory Visit: Payer: 59

## 2019-10-11 DIAGNOSIS — R748 Abnormal levels of other serum enzymes: Secondary | ICD-10-CM

## 2019-10-11 DIAGNOSIS — R7989 Other specified abnormal findings of blood chemistry: Secondary | ICD-10-CM

## 2019-10-12 ENCOUNTER — Ambulatory Visit: Payer: 59 | Admitting: Family Medicine

## 2019-10-12 ENCOUNTER — Other Ambulatory Visit: Payer: Self-pay | Admitting: Family Medicine

## 2019-10-12 DIAGNOSIS — R7989 Other specified abnormal findings of blood chemistry: Secondary | ICD-10-CM

## 2019-10-12 NOTE — Progress Notes (Signed)
Thyroid function remains abnormal. Will refer to endocrinology for further evaluation and treatment. Thyroid antibodies are negative.  Alk phos remains elevated. Awaiting isoenzyme results to determine if further testing is warranted.  ALT is slightly elevated. Avoid excessive tylenol and alcohol. Serum creatinine remains low but GFR is normal. Will continue to monitor. Make sure you are adequately hydrated and eating enough protein.

## 2019-10-15 LAB — CMP14+EGFR
ALT: 38 IU/L — ABNORMAL HIGH (ref 0–32)
AST: 21 IU/L (ref 0–40)
Albumin/Globulin Ratio: 1.3 (ref 1.2–2.2)
Albumin: 3.6 g/dL — ABNORMAL LOW (ref 3.8–4.8)
Alkaline Phosphatase: 197 IU/L — ABNORMAL HIGH (ref 39–117)
BUN/Creatinine Ratio: 63 — ABNORMAL HIGH (ref 12–28)
BUN: 22 mg/dL (ref 8–27)
Bilirubin Total: 0.6 mg/dL (ref 0.0–1.2)
CO2: 21 mmol/L (ref 20–29)
Calcium: 9.5 mg/dL (ref 8.7–10.3)
Chloride: 107 mmol/L — ABNORMAL HIGH (ref 96–106)
Creatinine, Ser: 0.35 mg/dL — ABNORMAL LOW (ref 0.57–1.00)
GFR calc Af Amer: 135 mL/min/{1.73_m2} (ref >59)
GFR calc non Af Amer: 117 mL/min/{1.73_m2} (ref >59)
Globulin, Total: 2.7 g/dL (ref 1.5–4.5)
Glucose: 102 mg/dL — ABNORMAL HIGH (ref 65–99)
Potassium: 4 mmol/L (ref 3.5–5.2)
Sodium: 139 mmol/L (ref 134–144)
Total Protein: 6.3 g/dL (ref 6.0–8.5)

## 2019-10-15 LAB — THYROID ANTIBODIES
Thyroglobulin Antibody: <1 IU/mL (ref 0.0–0.9)
Thyroperoxidase Ab SerPl-aCnc: 20 IU/mL (ref 0–34)

## 2019-10-15 LAB — THYROID PANEL WITH TSH
Free Thyroxine Index: >12.1 — ABNORMAL HIGH (ref 1.2–4.9)
T3 Uptake Ratio: >56 % — ABNORMAL HIGH (ref 24–39)
T4, Total: 21.6 ug/dL (ref 4.5–12.0)
TSH: <0.005 u[IU]/mL — ABNORMAL LOW (ref 0.450–4.500)

## 2019-10-15 LAB — GAMMA GT: GGT: 29 IU/L (ref 0–60)

## 2019-10-15 LAB — ALKALINE PHOSPHATASE, ISOENZYMES
BONE FRACTION: 56 % (ref 14–68)
INTESTINAL FRAC.: 3 % (ref 0–18)
LIVER FRACTION: 41 % (ref 18–85)

## 2019-10-17 ENCOUNTER — Other Ambulatory Visit: Payer: Self-pay

## 2019-10-17 ENCOUNTER — Other Ambulatory Visit: Payer: Self-pay | Admitting: Family Medicine

## 2019-10-17 ENCOUNTER — Ambulatory Visit: Payer: 59 | Admitting: Family Medicine

## 2019-10-17 ENCOUNTER — Encounter: Payer: Self-pay | Admitting: Family Medicine

## 2019-10-17 VITALS — BP 134/78 | HR 106 | Temp 99.3°F | Resp 20 | Ht 63.0 in | Wt 155.0 lb

## 2019-10-17 DIAGNOSIS — Z853 Personal history of malignant neoplasm of breast: Secondary | ICD-10-CM

## 2019-10-17 DIAGNOSIS — I10 Essential (primary) hypertension: Secondary | ICD-10-CM

## 2019-10-17 DIAGNOSIS — E049 Nontoxic goiter, unspecified: Secondary | ICD-10-CM | POA: Diagnosis not present

## 2019-10-17 DIAGNOSIS — Z1211 Encounter for screening for malignant neoplasm of colon: Secondary | ICD-10-CM | POA: Diagnosis not present

## 2019-10-17 DIAGNOSIS — R7989 Other specified abnormal findings of blood chemistry: Secondary | ICD-10-CM | POA: Insufficient documentation

## 2019-10-17 DIAGNOSIS — E0590 Thyrotoxicosis, unspecified without thyrotoxic crisis or storm: Secondary | ICD-10-CM | POA: Diagnosis not present

## 2019-10-17 DIAGNOSIS — R9431 Abnormal electrocardiogram [ECG] [EKG]: Secondary | ICD-10-CM

## 2019-10-17 MED ORDER — LISINOPRIL 5 MG PO TABS: 5.0000 mg | ORAL_TABLET | Freq: Every day | ORAL | 3 refills | Status: DC

## 2019-10-17 MED FILL — LISINOPRIL 5 MG TABS: 5 | 90 days supply | Qty: 90 | Fill #0

## 2019-10-17 NOTE — Progress Notes (Signed)
Subjective:  Patient ID: Andrea Castro, female    DOB: November 25, 1955, 63 y.o.   MRN: [id number]  Patient Care Team: Baruch Gouty, FNP as PCP - General (Family Medicine) Eston Esters, MD (Inactive) (Hematology and Oncology)   Chief Complaint:  Hypertension (2 week follow up )   HPI: Andrea Castro is a 63 y.o. female presenting on 10/17/2019 for Hypertension (2 week follow up )   Pt was recently started on chlorthalidone for hypertension. Pt has since had an elevated HR and continued hypertension. Pt states she felt shaky and was having palpitations when taking the chlorthalidone. Pt has since stopped the medication. Pts BP continues to be elevated. She has never been on any other antihypertensive therapy.  Her TSH remains low. She does have palpitations, tachycardia, and elevated blood pressure. She did have radiation therapy in the past due to breast cancer. She does report a dry throat and cough at times. She denies weight changes or anxiety.   Hypertension This is a recurrent problem. The current episode started more than 1 month ago. The problem is unchanged. The problem is uncontrolled. Associated symptoms include palpitations. Pertinent negatives include no anxiety, blurred vision, chest pain, headaches, malaise/fatigue, neck pain, orthopnea, peripheral edema, PND, shortness of breath or sweats. Past treatments include diuretics. The current treatment provides no improvement.     Relevant past medical, surgical, family, and social history reviewed and updated as indicated.  Allergies and medications reviewed and updated. Date reviewed: Chart in Epic.   Past Medical History:  Diagnosis Date  . ACL (anterior cruciate ligament) tear 2002  . Arthritis   . Atypical nevus 05/27/1996   Right Rim Ear-Slight  . Cancer [facility]) 2011   left breast  . Complication of anesthesia   . History of breast cancer   . Hyphema of right eye   . PONV (postoperative nausea and vomiting)   .  Ruptured disc, thoracic 08/1983    Past Surgical History:  Procedure Laterality Date  . BACK SURGERY  1984   ruptured disc  . CYSTOCELE REPAIR N/A 09/01/2019   Procedure: ANTERIOR REPAIR (CYSTOCELE);  Surgeon: Newton Pigg, MD;  Location: [facility];  Service: Gynecology;  Laterality: N/A;  . HERNIA REPAIR  02/20/11   ventral hernia  . KNEE ARTHROSCOPY Left 08/17/2015   Procedure: Left Knee Arthroscopy and Debridement;  Surgeon: Newt Minion, MD;  Location: [place];  Service: Orthopedics;  Laterality: Left;  . KNEE ARTHROSCOPY WITH ANTERIOR CRUCIATE LIGAMENT (ACL) REPAIR Left 03/2010  . MASTECTOMY  10/30/10   right, total  . MASTECTOMY  05/22/10   left cancer/chemo and radiation  . nipple construction  08/2011   tattoo at another date  . PORT-A-CATH REMOVAL  01/09/2012   Procedure: MINOR REMOVAL PORT-A-CATH;  Surgeon: Haywood Lasso, MD;  Location: [place];  Service: General;  Laterality: N/A;  . PORTACATH PLACEMENT  12/17/09  . PUBOVAGINAL SLING N/A 09/01/2019   Procedure: Gaynelle Arabian;  Surgeon: Bjorn Loser, MD;  Location: [facility];  Service: Urology;  Laterality: N/A;  . RECONSTRUCTION BREAST W/ TRAM FLAP  02/20/11   bilateral  . RECTOCELE REPAIR N/A 09/01/2019   Procedure: POSSIBLE POSTERIOR REPAIR (RECTOCELE);  Surgeon: Newton Pigg, MD;  Location: [facility];  Service: Gynecology;  Laterality: N/A;  possible  . torn ligament  2002   left knee  . TUBAL LIGATION  2004  . VAGINAL HYSTERECTOMY N/A 09/01/2019  Procedure: HYSTERECTOMY VAGINAL;  Surgeon: Newton Pigg, MD;  Location: [facility];  Service: Gynecology;  Laterality: N/A;    Social History   Socioeconomic History  . Marital status: Married    Spouse name: Not on file  . Number of children: Not on file  . Years of education: Not on file  . Highest education level: Not on file  Occupational History  . Not on file    Tobacco Use  . Smoking status: Never Smoker  . Smokeless tobacco: Never Used  Substance and Sexual Activity  . Alcohol use: No  . Drug use: No  . Sexual activity: Yes    Birth control/protection: Post-menopausal  Other Topics Concern  . Not on file  Social History Narrative  . Not on file   Social Determinants of Health   Financial Resource Strain:   . Difficulty of Paying Living Expenses: Not on file  Food Insecurity:   . Worried About Charity fundraiser in the Last Year: Not on file  . Ran Out of Food in the Last Year: Not on file  Transportation Needs:   . Lack of Transportation (Medical): Not on file  . Lack of Transportation (Non-Medical): Not on file  Physical Activity:   . Days of Exercise per Week: Not on file  . Minutes of Exercise per Session: Not on file  Stress:   . Feeling of Stress : Not on file  Social Connections:   . Frequency of Communication with Friends and Family: Not on file  . Frequency of Social Gatherings with Friends and Family: Not on file  . Attends Religious Services: Not on file  . Active Member of Clubs or Organizations: Not on file  . Attends Archivist Meetings: Not on file  . Marital Status: Not on file  Intimate Partner Violence:   . Fear of Current or Ex-Partner: Not on file  . Emotionally Abused: Not on file  . Physically Abused: Not on file  . Sexually Abused: Not on file    Outpatient Encounter Medications as of 10/17/2019  Medication Sig  . aspirin EC 81 MG tablet Take 1 tablet (81 mg total) by mouth daily.  . Biotin 2500 MCG CAPS Take 5,000 mcg by mouth daily.   . calcium-vitamin D (OSCAL WITH D) 500-200 MG-UNIT per tablet Take 1 tablet by mouth 2 (two) times daily.   . ferrous sulfate 325 (65 FE) MG tablet Take 325 mg by mouth daily with breakfast.  . fish oil-omega-3 fatty acids 1000 MG capsule Take 1 g by mouth 2 (two) times daily.   . Glucosamine HCl 1000 MG TABS Take 1,000 mg by mouth 2 (two) times daily.   Marland Kitchen  ibuprofen (ADVIL) 800 MG tablet Take 1 tablet (800 mg total) by mouth every 8 (eight) hours as needed.  . loratadine (CLARITIN) 10 MG tablet Take 10 mg by mouth daily as needed for allergies.  . Multiple Vitamin (MULTIVITAMIN PO) Take 1 tablet by mouth daily.   . Probiotic Product (PROBIOTIC DAILY PO) Take 1 capsule by mouth daily.  Marland Kitchen zinc gluconate 50 MG tablet Take 50 mg by mouth daily.  Marland Kitchen lisinopril (ZESTRIL) 5 MG tablet Take 1 tablet (5 mg total) by mouth daily.  . [DISCONTINUED] chlorthalidone (HYGROTON) 25 MG tablet Take 1 tablet (25 mg total) by mouth daily. (Patient not taking: Reported on 10/17/2019)   No facility-administered encounter medications on file as of 10/17/2019.    No Known Allergies  Review of  Systems  Constitutional: Negative for activity change, appetite change, chills, diaphoresis, fatigue, fever, malaise/fatigue and unexpected weight change.  HENT: Negative.   Eyes: Negative.  Negative for blurred vision, photophobia and visual disturbance.  Respiratory: Positive for cough. Negative for chest tightness and shortness of breath.   Cardiovascular: Positive for palpitations. Negative for chest pain, orthopnea, leg swelling and PND.  Gastrointestinal: Negative for blood in stool, constipation, diarrhea, nausea and vomiting.  Endocrine: Negative.  Negative for polydipsia, polyphagia and polyuria.  Genitourinary: Negative for decreased urine volume, difficulty urinating, dysuria, frequency and urgency.  Musculoskeletal: Negative for arthralgias, back pain, myalgias and neck pain.  Skin: Negative.   Allergic/Immunologic: Negative.   Neurological: Positive for tremors. Negative for dizziness, seizures, syncope, facial asymmetry, speech difficulty, weakness, light-headedness, numbness and headaches.  Hematological: Negative.   Psychiatric/Behavioral: Negative for confusion, hallucinations, sleep disturbance and suicidal ideas.  All other systems reviewed and are  negative.       Objective:  BP 134/78 (BP Location: Right Arm, Cuff Size: Normal)   Pulse (!) 106   Temp 99.3 F (37.4 C)   Resp 20   Ht '5\' 3"'  (1.6 m)   Wt 155 lb (70.3 kg)   SpO2 98%   BMI 27.46 kg/m    Wt Readings from Last 3 Encounters:  10/17/19 155 lb (70.3 kg)  09/28/19 158 lb (71.7 kg)  09/01/19 159 lb (72.1 kg)    Physical Exam Vitals and nursing note reviewed.  Constitutional:      General: She is not in acute distress.    Appearance: Normal appearance. She is well-developed and well-groomed. She is not ill-appearing, toxic-appearing or diaphoretic.  HENT:     Head: Normocephalic and atraumatic.     Jaw: There is normal jaw occlusion.     Right Ear: Hearing normal.     Left Ear: Hearing normal.     Nose: Nose normal.     Mouth/Throat:     Lips: Pink.     Mouth: Mucous membranes are moist.     Pharynx: Oropharynx is clear. Uvula midline.  Eyes:     General: Lids are normal.     Extraocular Movements: Extraocular movements intact.     Conjunctiva/sclera: Conjunctivae normal.     Pupils: Pupils are equal, round, and reactive to light.  Neck:     Thyroid: Thyromegaly present. No thyroid mass or thyroid tenderness.     Vascular: No carotid bruit or JVD.     Trachea: Trachea and phonation normal.  Cardiovascular:     Rate and Rhythm: Normal rate and regular rhythm.     Chest Wall: PMI is not displaced.     Pulses: Normal pulses.     Heart sounds: Normal heart sounds. No murmur. No friction rub. No gallop.   Pulmonary:     Effort: Pulmonary effort is normal. No respiratory distress.     Breath sounds: Normal breath sounds. No wheezing.  Abdominal:     General: Bowel sounds are normal. There is no distension or abdominal bruit.     Palpations: Abdomen is soft. There is no hepatomegaly or splenomegaly.     Tenderness: There is no abdominal tenderness. There is no right CVA tenderness or left CVA tenderness.     Hernia: No hernia is present.   Musculoskeletal:        General: Normal range of motion.     Cervical back: Normal range of motion and neck supple.     Right lower leg: No  edema.     Left lower leg: No edema.  Lymphadenopathy:     Cervical: No cervical adenopathy.  Skin:    General: Skin is warm and dry.     Capillary Refill: Capillary refill takes less than 2 seconds.     Coloration: Skin is not cyanotic, jaundiced or pale.     Findings: No rash.  Neurological:     General: No focal deficit present.     Mental Status: She is alert and oriented to person, place, and time.     Cranial Nerves: Cranial nerves are intact.     Sensory: Sensation is intact.     Motor: Motor function is intact.     Coordination: Coordination is intact.     Gait: Gait is intact.     Deep Tendon Reflexes: Reflexes are normal and symmetric.  Psychiatric:        Attention and Perception: Attention and perception normal.        Mood and Affect: Mood and affect normal.        Speech: Speech normal.        Behavior: Behavior normal. Behavior is cooperative.        Thought Content: Thought content normal.        Cognition and Memory: Cognition and memory normal.        Judgment: Judgment normal.     Results for orders placed or performed in visit on 10/11/19  Thyroid antibodies  Result Value Ref Range   Thyroperoxidase Ab SerPl-aCnc 20 0 - 34 IU/mL   Thyroglobulin Antibody <1.0 0.0 - 0.9 IU/mL  Thyroid Panel With TSH  Result Value Ref Range   TSH <0.005 (L) 0.450 - 4.500 uIU/mL   T4, Total 21.6 (HH) 4.5 - 12.0 ug/dL   T3 Uptake Ratio >56 (H) 24 - 39 %   Free Thyroxine Index >12.1 (H) 1.2 - 4.9  Gamma GT  Result Value Ref Range   GGT 29 0 - 60 IU/L  CMP14+EGFR  Result Value Ref Range   Glucose 102 (H) 65 - 99 mg/dL   BUN 22 8 - 27 mg/dL   Creatinine, Ser 0.35 (L) 0.57 - 1.00 mg/dL   GFR calc non Af Amer 117 >59 mL/min/1.73   GFR calc Af Amer 135 >59 mL/min/1.73   BUN/Creatinine Ratio 63 (H) 12 - 28   Sodium 139 134 - 144  mmol/L   Potassium 4.0 3.5 - 5.2 mmol/L   Chloride 107 (H) 96 - 106 mmol/L   CO2 21 20 - 29 mmol/L   Calcium 9.5 8.7 - 10.3 mg/dL   Total Protein 6.3 6.0 - 8.5 g/dL   Albumin 3.6 (L) 3.8 - 4.8 g/dL   Globulin, Total 2.7 1.5 - 4.5 g/dL   Albumin/Globulin Ratio 1.3 1.2 - 2.2   Bilirubin Total 0.6 0.0 - 1.2 mg/dL   Alkaline Phosphatase 197 (H) 39 - 117 IU/L   AST 21 0 - 40 IU/L   ALT 38 (H) 0 - 32 IU/L  Alkaline Phosphatase, Isoenzymes  Result Value Ref Range   LIVER FRACTION 41 18 - 85 %   BONE FRACTION 56 14 - 68 %   INTESTINAL FRAC. 3 0 - 18 %       Pertinent labs & imaging results that were available during my care of the patient were reviewed by me and considered in my medical decision making.  Assessment & Plan:  Betzabeth was seen today for hypertension.  Diagnoses and all orders for  this visit:  Essential hypertension Intolerant to chlorthalidone. Will switch to below. DASH diet. Report any persistent high or low BP readings. Repeat CMP in 4 weeks.  -     lisinopril (ZESTRIL) 5 MG tablet; Take 1 tablet (5 mg total) by mouth daily.  Low TSH level Goiter Persistent low TSH with elevated T3 and T4, will check antibodies today and order thyroid US. Referral to endocrinology placed.  -     Thyroid antibodies -     US Soft Tissue Head/Neck; Future -     Ambulatory referral to Endocrinology  Continue all other maintenance medications.  Follow up plan: Return in about 4 weeks (around 11/14/2019), or if symptoms worsen or fail to improve, for HTN.  Continue healthy lifestyle choices, including diet (rich in fruits, vegetables, and lean proteins, and low in salt and simple carbohydrates) and exercise (at least 30 minutes of moderate physical activity daily).  Educational handout given for DASH  The above assessment and management plan was discussed with the patient. The patient verbalized understanding of and has agreed to the management plan. Patient is aware to call the clinic  if they develop any new symptoms or if symptoms persist or worsen. Patient is aware when to return to the clinic for a follow-up visit. Patient educated on when it is appropriate to go to the emergency department.   Monia Pouch, FNP-C [facility] [phone]

## 2019-10-17 NOTE — Patient Instructions (Signed)
Goal BP is 130/80. Pt aware to report any persistent high or low readings. DASH diet and exercise encouraged. Exercise at least 150 minutes per week and increase as tolerated. Goal BMI > 25. Stress management encouraged. Avoid nicotine and tobacco product use. Avoid excessive alcohol and NSAID's. Avoid more than 2000 mg of sodium daily. Medications as prescribed. Follow up as scheduled.      DASH Eating Plan DASH stands for "Dietary Approaches to Stop Hypertension." The DASH eating plan is a healthy eating plan that has been shown to reduce high blood pressure (hypertension). Additional health benefits may include reducing the risk of type 2 diabetes mellitus, heart disease, and stroke. The DASH eating plan may also help with weight loss.  WHAT DO I NEED TO KNOW ABOUT THE DASH EATING PLAN? For the DASH eating plan, you will follow these general guidelines:  Choose foods with a percent daily value for sodium of less than 5% (as listed on the food label).  Use salt-free seasonings or herbs instead of table salt or sea salt.  Check with your health care provider or pharmacist before using salt substitutes.  Eat lower-sodium products, often labeled as "lower sodium" or "no salt added."  Eat fresh foods.  Eat more vegetables, fruits, and low-fat dairy products.  Choose whole grains. Look for the word "whole" as the first word in the ingredient list.  Choose fish and skinless chicken or [place] more often than red meat. Limit fish, poultry, and meat to 6 oz (170 g) each day.  Limit sweets, desserts, sugars, and sugary drinks.  Choose heart-healthy fats.  Limit cheese to 1 oz (28 g) per day.  Eat more home-cooked food and less restaurant, buffet, and fast food.  Limit fried foods.  Cook foods using methods other than frying.  Limit canned vegetables. If you do use them, rinse them well to decrease the sodium.  When eating at a restaurant, ask that your food be prepared with less  salt, or no salt if possible.  WHAT FOODS CAN I EAT? Seek help from a dietitian for individual calorie needs.  Grains Whole grain or whole wheat bread. Brown rice. Whole grain or whole wheat pasta. Quinoa, bulgur, and whole grain cereals. Low-sodium cereals. Corn or whole wheat flour tortillas. Whole grain cornbread. Whole grain crackers. Low-sodium crackers.  Vegetables Fresh or frozen vegetables (raw, steamed, roasted, or grilled). Low-sodium or reduced-sodium tomato and vegetable juices. Low-sodium or reduced-sodium tomato sauce and paste. Low-sodium or reduced-sodium canned vegetables.   Fruits All fresh, canned (in natural juice), or frozen fruits.  Meat and Other Protein Products Ground beef (85% or leaner), grass-fed beef, or beef trimmed of fat. Skinless chicken or [place]. Ground chicken or [place]. Pork trimmed of fat. All fish and seafood. Eggs. Dried beans, peas, or lentils. Unsalted nuts and seeds. Unsalted canned beans.  Dairy Low-fat dairy products, such as skim or 1% milk, 2% or reduced-fat cheeses, low-fat ricotta or cottage cheese, or plain low-fat yogurt. Low-sodium or reduced-sodium cheeses.  Fats and Oils Tub margarines without trans fats. Light or reduced-fat mayonnaise and salad dressings (reduced sodium). Avocado. Safflower, olive, or canola oils. Natural peanut or almond butter.  Other Unsalted popcorn and pretzels. The items listed above may not be a complete list of recommended foods or beverages. Contact your dietitian for more options.  WHAT FOODS ARE NOT RECOMMENDED?  Grains White bread. White pasta. White rice. Refined cornbread. Bagels and croissants. Crackers that contain trans fat.  Vegetables Creamed or fried  vegetables. Vegetables in a cheese sauce. Regular canned vegetables. Regular canned tomato sauce and paste. Regular tomato and vegetable juices.  Fruits Dried fruits. Canned fruit in light or heavy syrup. Fruit juice.  Meat and Other Protein  Products Fatty cuts of meat. Ribs, chicken wings, bacon, sausage, bologna, salami, chitterlings, fatback, hot dogs, bratwurst, and packaged luncheon meats. Salted nuts and seeds. Canned beans with salt.  Dairy Whole or 2% milk, cream, half-and-half, and cream cheese. Whole-fat or sweetened yogurt. Full-fat cheeses or blue cheese. Nondairy creamers and whipped toppings. Processed cheese, cheese spreads, or cheese curds.  Condiments Onion and garlic salt, seasoned salt, table salt, and sea salt. Canned and packaged gravies. Worcestershire sauce. Tartar sauce. Barbecue sauce. Teriyaki sauce. Soy sauce, including reduced sodium. Steak sauce. Fish sauce. Oyster sauce. Cocktail sauce. Horseradish. Ketchup and mustard. Meat flavorings and tenderizers. Bouillon cubes. Hot sauce. Tabasco sauce. Marinades. Taco seasonings. Relishes.  Fats and Oils Butter, stick margarine, lard, shortening, ghee, and bacon fat. Coconut, palm kernel, or palm oils. Regular salad dressings.  Other Pickles and olives. Salted popcorn and pretzels.  The items listed above may not be a complete list of foods and beverages to avoid. Contact your dietitian for more information.  WHERE CAN I FIND MORE INFORMATION? National Heart, [employer], and [employer] Institute: travelstabloid.com Document Released: 09/25/2011 Document Revised: 02/20/2014 Document Reviewed: 08/10/2013 [facility] Patient Information 2015 [place], [place]. This information is not intended to replace advice given to you by your health care provider. Make sure you discuss any questions you have with your health care provider.   I think that you would greatly benefit from seeing a nutritionist.  If you are interested, please call Dr Jenne Campus at [phone] to schedule an appointment.

## 2019-10-18 LAB — THYROID ANTIBODIES
Thyroglobulin Antibody: <1 IU/mL (ref 0.0–0.9)
Thyroperoxidase Ab SerPl-aCnc: 31 IU/mL (ref 0–34)

## 2019-10-19 ENCOUNTER — Encounter: Payer: Self-pay | Admitting: Endocrinology

## 2019-10-19 ENCOUNTER — Ambulatory Visit: Payer: 59 | Admitting: Endocrinology

## 2019-10-19 ENCOUNTER — Other Ambulatory Visit: Payer: Self-pay

## 2019-10-19 VITALS — BP 140/78 | HR 114 | Ht 63.0 in | Wt 155.2 lb

## 2019-10-19 DIAGNOSIS — E0590 Thyrotoxicosis, unspecified without thyrotoxic crisis or storm: Secondary | ICD-10-CM | POA: Diagnosis not present

## 2019-10-19 DIAGNOSIS — E0500 Thyrotoxicosis with diffuse goiter without thyrotoxic crisis or storm: Secondary | ICD-10-CM

## 2019-10-19 LAB — SPECIMEN STATUS REPORT

## 2019-10-19 MED ORDER — METOPROLOL SUCCINATE ER 50 MG PO TB24: 50.0000 mg | ORAL_TABLET | Freq: Every day | ORAL | 1 refills | Status: DC

## 2019-10-19 MED ORDER — METHIMAZOLE 10 MG PO TABS: 10.0000 mg | ORAL_TABLET | Freq: Three times a day (TID) | ORAL | 1 refills | Status: DC

## 2019-10-19 MED FILL — METOPROLOL SUCCINATE ER 50: 50 | 30 days supply | Qty: 30 | Fill #0

## 2019-10-19 MED FILL — methIMAzole 10 MG TABS: 10 | 30 days supply | Qty: 90 | Fill #0

## 2019-10-19 NOTE — Patient Instructions (Signed)
What is hyperthyroidism?  Hyperthyroidism develops when the body is exposed to excessive amounts of thyroid hormone. This disorder occurs in almost one percent of all Americans and affects women five to 10 times more often than men. In its mildest form, hyperthyroidism may not cause recognizable symptoms. More often, however, the symptoms are discomforting, disabling or even life-threatening.  What are the causes of hyperthyroidism?  Berenice Primas' disease: Graves' disease (named after [place] physician Raylene Everts) is an autoimmune disorder that frequently results in thyroid enlargement and hyperthyroidism. In some patients, swelling of the muscles and other tissues around the eyes may develop, causing eye prominence, discomfort or double vision. Like other autoimmune diseases, this condition tends to affect multiple family members. It is much more common in women than in men and tends to occur in younger patients. . Postpartum thyroiditis: Five to 10 percent of women develop mild to moderate hyperthyroidism within several months of giving birth. Hyperthyroidism in this condition usually lasts for approximately one to two months. It is often followed by several months of hypothyroidism, but most women will eventually recover normal thyroid function. In some cases, however, the thyroid gland does not heal, so the hypothyroidism becomes permanent and requires lifelong thyroid hormone replacement. This condition may occur again with subsequent pregnancies. .     What are the signs and symptoms of hyperthyroidism? When hyperthyroidism develops, a goiter (enlargement of the thyroid) is usually present and may be associated with some or many of the following features: . Fast heart rate, often more than 100 beats per minute . Becoming anxious, irritable, argumentative . Trembling hands . Weight loss, despite eating the same amount or even more than usual . Intolerance of warm temperatures and increased  likelihood to perspire . Loss of scalp hair . Tendency of fingernails to separate from the nail bed . Muscle weakness, especially of the upper arms and thighs . Loose and frequent bowel movements . Smooth skin . Change in menstrual pattern . Increased likelihood for miscarriage . Prominent "stare" of the eyes . Protrusion of the eyes, with or without double vision (in patients with Graves' disease) . Irregular heart rhythm, especially in patients older than 62 years of age . Accelerated loss of calcium from bones, which increases the risk of osteoporosis and fractures   How is hyperthyroidism diagnosed? Sometimes a general physician can diagnose and treat the cause of hyperthyroidism, but assistance is often needed from an endocrinologist, a physician who specializes in managing thyroid disease. Characteristic symptoms and physical signs of the disease can be detected by a trained physician. In addition, tests can be used to confirm the diagnosis and to determine the cause.  Tests TSH (THYROID-STIMULATING HORMONE OR THYROTROPIN): A low TSH level in the blood is the most accurate indicator of hyperthyroidism. The body shuts off production of this pituitary hormone when the thyroid gland even slightly overproduces thyroid hormone. If the TSH level is low, it is very important to also check thyroid hormone levels to confirm the diagnosis of hyperthyroidism.  ESTIMATES OF FREE THYROXINE AND FREE TRIIODOTHYRONINE: When hyperthyroidism develops, free thyroxine and free triiodothyronine levels rise above previous values in that specific patient (although they may still fall within the normal range for the general population) and are often considerably elevated. TSI (THYROID-STIMULATING IMMUNOGLOBULIN): A substance often found in the blood when Graves' disease is the cause of hyperthyroidism. RADIOACTIVE IODINE UPTAKE (RAIU): The amount of iodine the thyroid gland can collect, and a thyroid scan, which  shows how  the iodine is distributed throughout the thyroid gland.  THYROID SCAN: This information can be useful in determining the cause of hyperthyroidism and, ultimately, its treatment.   How is hyperthyroidism treated? Appropriate management of hyperthyroidism requires careful evaluation and ongoing care by a physician experienced in the treatment of this complex condition. Before the development of current treatment options, the death rate from severe hyperthyroidism was as high as 50 percent. Now several effective treatments are available and, with proper management, death from hyperthyroidism is rare. Deciding which treatment is best depends on what caused the hyperthyroidism, its severity and other conditions present.   . Antithyroid Drugs In the [place], two drugs are available for treating hyperthyroidism: propylthiouracil (PTU) and methimazole (Tapazole). Except for early pregnancy, methimazole is preferred because PTU can cause fatal liver damage, although rarely. These medications control hyperthyroidism by slowing thyroid hormone production. They may take several months to normalize thyroid hormone levels. Some patients with hyperthyroidism caused by Graves' disease experience a spontaneous or natural remission of hyperthyroidism after a 12- to 33-month course of treatment with these drugs and may sometimes avoid permanent underactivity of the thyroid (hypothyroidism), which often occurs as a result of using the other methods of treating hyperthyroidism. Unfortunately, the remission is frequently only temporary, with the hyperthyroidism recurring after several months or years off medication and requiring additional treatment, so relatively few patients are treated solely with antithyroid medication in the [place]. Antithyroid drugs may cause an allergic reaction in about five percent of patients who use them. This usually occurs during the first six weeks of drug treatment. Such a  reaction may include rash or hives; but after discontinuing use of the drug, the symptoms resolve within one to two weeks and there is no permanent damage. A more serious side effect, but occurring in only about one in 250-500 patients during the first four to eight weeks of treatment, is a rapid decrease of white blood cells in the bloodstream. This could increase susceptibility to serious infection. Symptoms such as a sore throat, infection or fever should be reported promptly to your physician, and a white blood cell count should be done immediately. In nearly every case, when a person stops using the medication, the white blood cell count returns to normal. Very rarely, antithyroid drugs may cause severe liver problems, which can be detected by monitoring blood tests or joint problems characterized by joint pain and/or swelling. Your physician should be contacted if there is yellowing of the skin (jaundice), fever, loss of appetite or abdominal pain.  . Radioactive Iodine Treatment Iodine is an essential ingredient in the production of thyroid hormone. Each molecule of thyroid hormone contains either four (T4) or three (T3) molecules of iodine. Since most overactive thyroid glands are quite hungry for iodine, it was discovered in the 1940s that the thyroid could be "tricked" into destroying itself by simply feeding it radioactive iodine. The radioactive iodine is given by mouth, usually in capsule form, and is quickly absorbed from the bowel. It then enters the thyroid cells from the bloodstream and gradually destroys them. Maximal benefit is usually noted within three to six months. It is not possible to eliminate "just the right amount" of the diseased thyroid gland, since radioiodine eventually damages all thyroid cells. Therefore, most endocrinologists strive to completely destroy the diseased thyroid gland with a single dose of radioiodine. This results in the intentional development of an underactive  thyroid state (hypothyroidism), which is easily, predictably and inexpensively corrected by lifelong  daily use of oral thyroid hormone replacement therapy. Although every effort is made to calculate the correct dose of radioiodine for each patient, not every treatment will successfully correct the hyperthyroidism, particularly if the goiter is quite large and a second dose of radioactive iodine is occasionally needed. Thousands of patients have received radioiodine treatment, including former Software engineer of the [employer] [place] and his wife, Pamala Hurry. The treatment is a very safe, simple and reliably effective one. Because of this, it is considered by most thyroid specialists in the [place] to be the treatment of choice for hyperthyroidism cases caused by overproduction of thyroid hormone.   . Other Treatments A drug from the class of beta-adrenergic blocking agents (which decrease the effects of excess thyroid hormone) may be used temporarily to control hyperthyroid symptoms until other therapies take effect. In cases where hyperthyroidism is caused by thyroiditis or excessive ingestion of either iodine or thyroid hormone, this may be the only type of treatment required. Iodine drops are prescribed when hyperthyroidism is severe or prior to undergoing surgery for Graves' disease.

## 2019-10-19 NOTE — [place] Notes (Signed)
Patient ID: Andrea Castro, female   DOB: 05-May-1956, 63 y.o.   MRN: [id number]                                                                                                              Reason for Appointment:  Hyperthyroidism, new consultation  Referring healthcare provider: Darla Lesches FNP   Chief complaint: Weight loss   History of Present Illness:   For the last few months patient has had weight loss of 43 pounds She thinks this was from changing her diet and generally exercising more However she also feels like she has had shakiness of her entire body and also a feeling of fast heart rate She is starting to get hot flashes and generally feeling hot more than usual recently However she does not think she has any fatigue Her arms may be a little weaker than usual and she is not able to do as much physical work with her arms  She does not think she is feeling more tired No change in bowel habits She has had tendency to swelling of her legs off and on  She was told to have high blood pressure by her gynecologist and was seen by her PCP as a new patient to establish care Thyroid levels were checked and were abnormal as below  Wt Readings from Last 3 Encounters:  10/19/19 155 lb 3.2 oz (70.4 kg)  10/17/19 155 lb (70.3 kg)  09/28/19 158 lb (71.7 kg)     Treatments so far:  Thyroid function tests as follows:     Lab Results  Component Value Date   TSH <0.005 (L) 10/11/2019   TSH <0.005 (L) 09/28/2019   Free thyroxine index >12.1 with total T4 21.6 as of 10/11/2019  No results found for: THYROTRECAB   Allergies as of 10/19/2019   No Known Allergies     Medication List       Accurate as of October 19, 2019 11:09 AM. If you have any questions, ask your nurse or doctor.        aspirin EC 81 MG tablet Take 1 tablet (81 mg total) by mouth daily.   Biotin 2500 MCG Caps Take 5,000 mcg by mouth daily.   calcium-vitamin D 500-200 MG-UNIT tablet Commonly known  as: OSCAL WITH D Take 1 tablet by mouth 2 (two) times daily.   ferrous sulfate 325 (65 FE) MG tablet Take 325 mg by mouth daily with breakfast.   fish oil-omega-3 fatty acids 1000 MG capsule Take 1 g by mouth 2 (two) times daily.   Glucosamine HCl 1000 MG Tabs Take 1,000 mg by mouth 2 (two) times daily.   ibuprofen 800 MG tablet Commonly known as: ADVIL Take 1 tablet (800 mg total) by mouth every 8 (eight) hours as needed.   lisinopril 5 MG tablet Commonly known as: ZESTRIL Take 1 tablet (5 mg total) by mouth daily.   loratadine 10 MG tablet Commonly known as: CLARITIN Take 10 mg by mouth daily as needed for allergies.  MULTIVITAMIN PO Take 1 tablet by mouth daily.   PROBIOTIC DAILY PO Take 1 capsule by mouth daily.   zinc gluconate 50 MG tablet Take 50 mg by mouth daily.           Past Medical History:  Diagnosis Date  . ACL (anterior cruciate ligament) tear 2002  . Arthritis   . Atypical nevus 05/27/1996   Right Rim Ear-Slight  . Cancer [facility]) 2011   left breast  . Complication of anesthesia   . History of breast cancer   . Hyphema of right eye   . PONV (postoperative nausea and vomiting)   . Ruptured disc, thoracic 08/1983    Past Surgical History:  Procedure Laterality Date  . BACK SURGERY  1984   ruptured disc  . CYSTOCELE REPAIR N/A 09/01/2019   Procedure: ANTERIOR REPAIR (CYSTOCELE);  Surgeon: Newton Pigg, MD;  [facility];  Service: Gynecology;  Laterality: N/A;  . HERNIA REPAIR  02/20/11   ventral hernia  . KNEE ARTHROSCOPY Left 08/17/2015   Procedure: Left Knee Arthroscopy and Debridement;  Surgeon: Newt Minion, MD;  Location: [place];  Service: Orthopedics;  Laterality: Left;  . KNEE ARTHROSCOPY WITH ANTERIOR CRUCIATE LIGAMENT (ACL) REPAIR Left 03/2010  . MASTECTOMY  10/30/10   right, total  . MASTECTOMY  05/22/10   left cancer/chemo and radiation  . nipple construction  08/2011   tattoo at another date  .  PORT-A-CATH REMOVAL  01/09/2012   Procedure: MINOR REMOVAL PORT-A-CATH;  Surgeon: Haywood Lasso, MD;  Location: [place];  Service: General;  Laterality: N/A;  . PORTACATH PLACEMENT  12/17/09  . PUBOVAGINAL SLING N/A 09/01/2019   Procedure: Gaynelle Arabian;  Surgeon: Bjorn Loser, MD;  Location[facility];  Service: Urology;  Laterality: N/A;  . RECONSTRUCTION BREAST W/ TRAM FLAP  02/20/11   bilateral  . RECTOCELE REPAIR N/A 09/01/2019   Procedure: POSSIBLE POSTERIOR REPAIR (RECTOCELE);  Surgeon: Newton Pigg, MD;  Location: [facility];  Service: Gynecology;  Laterality: N/A;  possible  . torn ligament  2002   left knee  . TUBAL LIGATION  2004  . VAGINAL HYSTERECTOMY N/A 09/01/2019   Procedure: HYSTERECTOMY VAGINAL;  Surgeon: Newton Pigg, MD;  Location: [facility];  Service: Gynecology;  Laterality: N/A;    Family History  Problem Relation Age of Onset  . COPD Father   . Hypertension Father   . Hypertension Mother   . Parkinson's disease Mother     Social History:  reports that she has never smoked. She has never used smokeless tobacco. She reports that she does not drink alcohol or use drugs.  Allergies: No Known Allergies   Review of Systems  Constitutional: Positive for weight loss.  HENT: Negative for trouble swallowing.   Eyes: Positive for blurred vision. Negative for visual disturbance.  Cardiovascular: Positive for palpitations and leg swelling.       Has had persistent swelling in her legs but not feet  Gastrointestinal: Negative for diarrhea.  Endocrine: Positive for heat intolerance. Negative for fatigue.  Genitourinary: Negative for frequency.  Musculoskeletal: Negative for muscle aches and muscle cramps.  Skin: Negative for itching.  Neurological: Positive for weakness and tremors.       Has mild weakness in her upper extremities.       Examination:   BP 140/78 (BP Location:  Right Arm, Patient Position: Sitting, Cuff Size: Normal)   Pulse (!) 114   Ht  5\' 3"  (1.6 m)   Wt 155 lb 3.2 oz (70.4 kg)   SpO2 98%   BMI 27.49 kg/m    General Appearance:  well-built and nourished, pleasant, not anxious Mildly hyperkinetic       Eyes:  She has mild bilateral prominence of the eyes.  She does have upper lid retraction Has mild lid lag and stare present.   No swelling of the eyelids   Neck: The thyroid is enlarged 2-2.5 times normal, slightly irregular, non-tender and diffuse.  There is no lymphadenopathy in the neck .           Heart: normal S1 and S2, no murmurs .          Lungs: breath sounds are normal bilaterally without added sounds  Abdomen: no hepatosplenomegaly or other palpable abnormality  Extremities: hands are warm. No ankle edema.  Neurological:  Bilateral fine tremors are present. Deep tendon reflexes at biceps and ankles are brisk.  Skin: No rash, Appears to have thickening of the skin on the lower lateral legs especially on the right without discoloration. 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  Explained that the options for treatment are basically antithyroid drugs and radioactive iodine.  Discussed the pros and cons for each treatment: Antithyroid drugs would be reasonable for mild disease but would need frequent followup with lab monitoring as well as potential for side effects from the medications and uncertainty about long-term cure of the problem Discussed that I-131 treatment is safe and simple to do but will result in long-term hypothyroidism  that will result from ablation of the thyroid tissue and the need for lifelong supplementation and periodic monitoring.  Clinically would be appropriate for the patient to be treated with antithyroid drugs at this time with methimazole for symptomatic improvement Discussed how this works and possible side effects She will start with 30 mg daily in divided doses  Metoprolol will be given for control of heart rate and tremor, in the meantime she will need to stop her lisinopril as most of her systolic hypertension is likely to be from hypothyroidism  Thyrotropin receptor antibody will be done for baseline level and confirmation of the diagnosis  Patient handout on hyperthyroidism and radioactive iodine treatment given Patient understands the above discussion and treatment options. All questions were answered satisfactorily  Follow-up in 3 weeks  Consult note sent to referring physician  Elayne Snare 10/19/2019, 11:09 AM    Note: This office note was prepared with Dragon voice recognition system technology. Any transcriptional errors that result from this process are unintentional.

## 2019-10-20 LAB — THYROTROPIN RECEPTOR AUTOABS: Thyrotropin Receptor Ab: 30.3 IU/L — ABNORMAL HIGH (ref 0.00–1.75)

## 2019-10-20 LAB — SPECIMEN STATUS REPORT

## 2019-10-23 NOTE — [place] Notes (Signed)
TRAB is elevated indicating Grave's Disease, an autoimmune thyroid disease. Please keep follow up with endocrinology as discussed for further management.

## 2019-10-26 ENCOUNTER — Ambulatory Visit (HOSPITAL_COMMUNITY): Payer: 59

## 2019-10-31 ENCOUNTER — Encounter: Payer: Self-pay | Admitting: Family Medicine

## 2019-11-02 ENCOUNTER — Other Ambulatory Visit: Payer: Self-pay | Admitting: Family Medicine

## 2019-11-02 ENCOUNTER — Ambulatory Visit
Admission: RE | Admit: 2019-11-02 | Discharge: 2019-11-02 | Disposition: A | Discharge status: Home / Self Care | Payer: 59 | Source: Ambulatory Visit | Attending: Family Medicine | Admitting: Family Medicine

## 2019-11-02 ENCOUNTER — Other Ambulatory Visit: Payer: Self-pay

## 2019-11-02 DIAGNOSIS — Z853 Personal history of malignant neoplasm of breast: Secondary | ICD-10-CM

## 2019-11-02 DIAGNOSIS — Z1231 Encounter for screening mammogram for malignant neoplasm of breast: Secondary | ICD-10-CM

## 2019-11-02 DIAGNOSIS — R928 Other abnormal and inconclusive findings on diagnostic imaging of breast: Secondary | ICD-10-CM | POA: Diagnosis not present

## 2019-11-03 ENCOUNTER — Ambulatory Visit: Payer: 59 | Admitting: Cardiology

## 2019-11-04 ENCOUNTER — Ambulatory Visit: Payer: 59 | Admitting: Cardiology

## 2019-11-04 ENCOUNTER — Other Ambulatory Visit: Payer: 59

## 2019-11-07 ENCOUNTER — Other Ambulatory Visit (INDEPENDENT_AMBULATORY_CARE_PROVIDER_SITE_OTHER): Payer: 59

## 2019-11-07 ENCOUNTER — Other Ambulatory Visit: Payer: Self-pay

## 2019-11-07 DIAGNOSIS — E0500 Thyrotoxicosis with diffuse goiter without thyrotoxic crisis or storm: Secondary | ICD-10-CM

## 2019-11-07 DIAGNOSIS — E0590 Thyrotoxicosis, unspecified without thyrotoxic crisis or storm: Secondary | ICD-10-CM

## 2019-11-07 LAB — CBC
HCT: 36.4 % (ref 36.0–46.0)
Hemoglobin: 11.9 g/dL — ABNORMAL LOW (ref 12.0–15.0)
MCHC: 32.7 g/dL (ref 30.0–36.0)
MCV: 86.9 fl (ref 78.0–100.0)
Platelets: 236 10*3/uL (ref 150.0–400.0)
RBC: 4.19 Mil/uL (ref 3.87–5.11)
RDW: 14.7 % (ref 11.5–15.5)
WBC: 5.7 10*3/uL (ref 4.0–10.5)

## 2019-11-07 LAB — COMPREHENSIVE METABOLIC PANEL
ALT: 32 U/L (ref 0–35)
AST: 25 U/L (ref 0–37)
Albumin: 3.8 g/dL (ref 3.5–5.2)
Alkaline Phosphatase: 226 U/L — ABNORMAL HIGH (ref 39–117)
BUN: 16 mg/dL (ref 6–23)
CO2: 25 mEq/L (ref 19–32)
Calcium: 9.4 mg/dL (ref 8.4–10.5)
Chloride: 105 mEq/L (ref 96–112)
Creatinine, Ser: 0.55 mg/dL (ref 0.40–1.20)
GFR: 111.88 mL/min (ref >60.00)
Glucose, Bld: 124 mg/dL — ABNORMAL HIGH (ref 70–99)
Potassium: 3.7 mEq/L (ref 3.5–5.1)
Sodium: 137 mEq/L (ref 135–145)
Total Bilirubin: 0.5 mg/dL (ref 0.2–1.2)
Total Protein: 7.3 g/dL (ref 6.0–8.3)

## 2019-11-07 LAB — T3, FREE: T3, Free: 7.2 pg/mL — ABNORMAL HIGH (ref 2.3–4.2)

## 2019-11-07 LAB — T4, FREE: Free T4: 1.91 ng/dL — ABNORMAL HIGH (ref 0.60–1.60)

## 2019-11-08 LAB — THYROTROPIN RECEPTOR AUTOABS: Thyrotropin Receptor Ab: 36.2 IU/L — ABNORMAL HIGH (ref 0.00–1.75)

## 2019-11-08 NOTE — Progress Notes (Deleted)
Patient ID: Andrea Castro, female   DOB: May 28, 1956, 64 y.o.   MRN: [id number]                                                                                                              Reason for Appointment:  Hyperthyroidism, new consultation  Referring healthcare provider: Darla Lesches FNP   Chief complaint: Weight loss   History of Present Illness:   For the last few months patient has had weight loss of 43 pounds She thinks this was from changing her diet and generally exercising more However she also feels like she has had shakiness of her entire body and also a feeling of fast heart rate She is starting to get hot flashes and generally feeling hot more than usual recently However she does not think she has any fatigue Her arms may be a little weaker than usual and she is not able to do as much physical work with her arms  She does not think she is feeling more tired No change in bowel habits She has had tendency to swelling of her legs off and on  She was told to have high blood pressure by her gynecologist and was seen by her PCP as a new patient to establish care Thyroid levels were checked and were abnormal as below  Wt Readings from Last 3 Encounters:  10/19/19 155 lb 3.2 oz (70.4 kg)  10/17/19 155 lb (70.3 kg)  09/28/19 158 lb (71.7 kg)     Treatments so far:  Thyroid function tests as follows:     Lab Results  Component Value Date   FREET4 1.91 (H) 11/07/2019   T3FREE 7.2 (H) 11/07/2019   TSH <0.005 (L) 10/11/2019   TSH <0.005 (L) 09/28/2019   Free thyroxine index >12.1 with total T4 21.6 as of 10/11/2019  Lab Results  Component Value Date   THYROTRECAB 36.20 (H) 11/07/2019   THYROTRECAB 30.30 (H) 10/17/2019     Allergies as of 11/09/2019   No Known Allergies     Medication List       Accurate as of November 08, 2019  9:05 PM. If you have any questions, ask your nurse or doctor.        aspirin EC 81 MG tablet Take 1 tablet (81 mg total) by  mouth daily.   Biotin 2500 MCG Caps Take 5,000 mcg by mouth daily.   calcium-vitamin D 500-200 MG-UNIT tablet Commonly known as: OSCAL WITH D Take 1 tablet by mouth 2 (two) times daily.   ferrous sulfate 325 (65 FE) MG tablet Take 325 mg by mouth daily with breakfast.   fish oil-omega-3 fatty acids 1000 MG capsule Take 1 g by mouth 2 (two) times daily.   Glucosamine HCl 1000 MG Tabs Take 1,000 mg by mouth 2 (two) times daily.   ibuprofen 800 MG tablet Commonly known as: ADVIL Take 1 tablet (800 mg total) by mouth every 8 (eight) hours as needed.   lisinopril 5 MG tablet Commonly known as: ZESTRIL Take 1  tablet (5 mg total) by mouth daily.   loratadine 10 MG tablet Commonly known as: CLARITIN Take 10 mg by mouth daily as needed for allergies.   methimazole 10 MG tablet Commonly known as: TAPAZOLE Take 1 tablet (10 mg total) by mouth 3 (three) times daily.   metoprolol succinate 50 MG 24 hr tablet Commonly known as: Toprol XL Take 1 tablet (50 mg total) by mouth daily. Take with or immediately following a meal.   MULTIVITAMIN PO Take 1 tablet by mouth daily.   PROBIOTIC DAILY PO Take 1 capsule by mouth daily.   zinc gluconate 50 MG tablet Take 50 mg by mouth daily.           Past Medical History:  Diagnosis Date  . ACL (anterior cruciate ligament) tear 2002  . Arthritis   . Atypical nevus 05/27/1996   Right Rim Ear-Slight  . Cancer [facility]) 2011   left breast  . Complication of anesthesia   . History of breast cancer   . Hyphema of right eye   . PONV (postoperative nausea and vomiting)   . Ruptured disc, thoracic 08/1983    Past Surgical History:  Procedure Laterality Date  . BACK SURGERY  1984   ruptured disc  . CYSTOCELE REPAIR N/A 09/01/2019   Procedure: ANTERIOR REPAIR (CYSTOCELE);  Surgeon: Newton Pigg, MD;  [facility];  Service: Gynecology;  Laterality: N/A;  . HERNIA REPAIR  02/20/11   ventral hernia  . KNEE  ARTHROSCOPY Left 08/17/2015   Procedure: Left Knee Arthroscopy and Debridement;  Surgeon: Newt Minion, MD;  Location: [place];  Service: Orthopedics;  Laterality: Left;  . KNEE ARTHROSCOPY WITH ANTERIOR CRUCIATE LIGAMENT (ACL) REPAIR Left 03/2010  . MASTECTOMY  10/30/10   right, total  . MASTECTOMY  05/22/10   left cancer/chemo and radiation  . nipple construction  08/2011   tattoo at another date  . PORT-A-CATH REMOVAL  01/09/2012   Procedure: MINOR REMOVAL PORT-A-CATH;  Surgeon: Haywood Lasso, MD;  Location: [place];  Service: General;  Laterality: N/A;  . PORTACATH PLACEMENT  12/17/09  . PUBOVAGINAL SLING N/A 09/01/2019   Procedure: Gaynelle Arabian;  Surgeon: Bjorn Loser, MD;  Location: [facility];  Service: Urology;  Laterality: N/A;  . RECONSTRUCTION BREAST W/ TRAM FLAP  02/20/11   bilateral  . RECTOCELE REPAIR N/A 09/01/2019   Procedure: POSSIBLE POSTERIOR REPAIR (RECTOCELE);  Surgeon: Newton Pigg, MD;  Location: [facility];  Service: Gynecology;  Laterality: N/A;  possible  . torn ligament  2002   left knee  . TUBAL LIGATION  2004  . VAGINAL HYSTERECTOMY N/A 09/01/2019   Procedure: HYSTERECTOMY VAGINAL;  Surgeon: Newton Pigg, MD;  Location: [facility];  Service: Gynecology;  Laterality: N/A;    Family History  Problem Relation Age of Onset  . COPD Father   . Hypertension Father   . Hypertension Mother   . Parkinson's disease Mother     Social History:  reports that she has never smoked. She has never used smokeless tobacco. She reports that she does not drink alcohol or use drugs.  Allergies: No Known Allergies   Review of Systems  Constitutional: Positive for weight loss.  HENT: Negative for trouble swallowing.   Eyes: Positive for blurred vision. Negative for visual disturbance.  Cardiovascular: Positive for palpitations and leg swelling.       Has had persistent swelling in her legs but  not feet  Gastrointestinal: Negative for diarrhea.  Endocrine: Positive for heat intolerance. Negative for fatigue.  Genitourinary: Negative for frequency.  Musculoskeletal: Negative for muscle aches and muscle cramps.  Skin: Negative for itching.  Neurological: Positive for weakness and tremors.       Has mild weakness in her upper extremities.       Examination:   There were no vitals taken for this visit.   General Appearance:  well-built and nourished, pleasant, not anxious Mildly hyperkinetic       Eyes:  She has mild bilateral prominence of the eyes.  She does have upper lid retraction Has mild lid lag and stare present.   No swelling of the eyelids   Neck: The thyroid is enlarged 2-2.5 times normal, slightly irregular, non-tender and diffuse.  There is no lymphadenopathy in the neck .           Heart: normal S1 and S2, no murmurs .          Lungs: breath sounds are normal bilaterally without added sounds  Abdomen: no hepatosplenomegaly or other palpable abnormality  Extremities: hands are warm. No ankle edema.  Neurological:  Bilateral fine tremors are present. Deep tendon reflexes at biceps and ankles are brisk.  Skin: No rash, Appears to have thickening of the skin on the lower lateral legs especially on the right without discoloration. 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  Explained that the options for treatment are basically antithyroid drugs and radioactive iodine.  Discussed the pros and cons for each treatment: Antithyroid drugs would be reasonable for mild  disease but would need frequent followup with lab monitoring as well as potential for side effects from the medications and uncertainty about long-term cure of the problem Discussed that I-131 treatment is safe and simple to do but will result in long-term hypothyroidism that will result from ablation of the thyroid tissue and the need for lifelong supplementation and periodic monitoring.  Clinically would be appropriate for the patient to be treated with antithyroid drugs at this time with methimazole for symptomatic improvement Discussed how this works and possible side effects She will start with 30 mg daily in divided doses  Metoprolol will be given for control of heart rate and tremor, in the meantime she will need to stop her lisinopril as most of her systolic hypertension is likely to be from hypothyroidism  Thyrotropin receptor antibody will be done for baseline level and confirmation of the diagnosis  Patient handout on hyperthyroidism and radioactive iodine treatment given Patient understands the above discussion and treatment options. All questions were answered satisfactorily  Follow-up in 3 weeks    Elayne Snare 11/08/2019, 9:05 PM    Note: This office note was prepared with Dragon voice recognition system technology. Any transcriptional errors that result from this process are unintentional.

## 2019-11-09 ENCOUNTER — Other Ambulatory Visit: Payer: Self-pay

## 2019-11-09 ENCOUNTER — Ambulatory Visit: Payer: 59 | Admitting: Endocrinology

## 2019-11-10 ENCOUNTER — Encounter: Payer: Self-pay | Admitting: Endocrinology

## 2019-11-10 ENCOUNTER — Ambulatory Visit: Payer: 59 | Admitting: Endocrinology

## 2019-11-10 VITALS — BP 120/68 | HR 93 | Ht 63.0 in | Wt 153.6 lb

## 2019-11-10 DIAGNOSIS — E0590 Thyrotoxicosis, unspecified without thyrotoxic crisis or storm: Secondary | ICD-10-CM | POA: Diagnosis not present

## 2019-11-10 NOTE — Patient Instructions (Addendum)
Take Methimazole 2 tab 2x daily

## 2019-11-10 NOTE — Progress Notes (Signed)
Patient ID: Andrea Castro, female   DOB: 24-Jul-1956, 64 y.o.   MRN: [id number]                                                                                                              Reason for Appointment:  Hyperthyroidism, follow-up visit  Referring healthcare provider: Darla Lesches FNP   Chief complaint: Shakiness    History of Present Illness:   History at baseline: For the last few months patient has had weight loss of 43 pounds She thinks this was from changing her diet and generally exercising more However she also feels like she has had shakiness of her entire body and also a feeling of fast heart rate She is starting to get hot flashes and generally feeling hot more than usual recently However she does not think she has any fatigue Her arms may be a little weaker than usual and she is not able to do as much physical work with her arms  She does not think she is feeling more tired  Thyroid levels were checked in 12/20 and were abnormal as below  Treatments so far: Methimazole, metoprolol  Recent history:  She is coming back on follow-up about 3 weeks after starting methimazole 10 mg 3 times daily Most of her symptoms are improving and she has less shakiness, feeling of palpitations or hot flashes She is able to walk up to 3 miles without difficulty She is however having some difficulty singing because of getting out of breath Also may sometimes have a little difficulty swallowing Weight has gone down slightly  Her thyroid levels show improvement in free T4 but T3 still high at 7.2 Thyrotropin receptor antibody is still over 30  Wt Readings from Last 3 Encounters:  11/10/19 153 lb 9.6 oz (69.7 kg)  10/19/19 155 lb 3.2 oz (70.4 kg)  10/17/19 155 lb (70.3 kg)       Thyroid function tests as follows:     Lab Results  Component Value Date   FREET4 1.91 (H) 11/07/2019   T3FREE 7.2 (H) 11/07/2019   TSH <0.005 (L) 10/11/2019   TSH <0.005 (L) 09/28/2019    Free thyroxine index >12.1 with total T4 21.6 as of 10/11/2019  Lab Results  Component Value Date   THYROTRECAB 36.20 (H) 11/07/2019   THYROTRECAB 30.30 (H) 10/17/2019     Allergies as of 11/10/2019   No Known Allergies     Medication List       Accurate as of November 10, 2019  3:00 PM. If you have any questions, ask your nurse or doctor.        aspirin EC 81 MG tablet Take 1 tablet (81 mg total) by mouth daily.   Biotin 2500 MCG Caps Take 5,000 mcg by mouth daily.   calcium-vitamin D 500-200 MG-UNIT tablet Commonly known as: OSCAL WITH D Take 1 tablet by mouth 2 (two) times daily.   ferrous sulfate 325 (65 FE) MG tablet Take 325 mg by mouth daily with breakfast.   fish  oil-omega-3 fatty acids 1000 MG capsule Take 1 g by mouth 2 (two) times daily.   Glucosamine HCl 1000 MG Tabs Take 1,000 mg by mouth 2 (two) times daily.   ibuprofen 800 MG tablet Commonly known as: ADVIL Take 1 tablet (800 mg total) by mouth every 8 (eight) hours as needed.   lisinopril 5 MG tablet Commonly known as: ZESTRIL Take 1 tablet (5 mg total) by mouth daily.   loratadine 10 MG tablet Commonly known as: CLARITIN Take 10 mg by mouth daily as needed for allergies.   methimazole 10 MG tablet Commonly known as: TAPAZOLE Take 1 tablet (10 mg total) by mouth 3 (three) times daily.   metoprolol succinate 50 MG 24 hr tablet Commonly known as: Toprol XL Take 1 tablet (50 mg total) by mouth daily. Take with or immediately following a meal.   MULTIVITAMIN PO Take 1 tablet by mouth daily.   PROBIOTIC DAILY PO Take 1 capsule by mouth daily.   zinc gluconate 50 MG tablet Take 50 mg by mouth daily.           Past Medical History:  Diagnosis Date  . ACL (anterior cruciate ligament) tear 2002  . Arthritis   . Atypical nevus 05/27/1996   Right Rim Ear-Slight  . Cancer [facility]) 2011   left breast  . Complication of anesthesia   . History of breast cancer   . Hyphema of right eye    . PONV (postoperative nausea and vomiting)   . Ruptured disc, thoracic 08/1983    Past Surgical History:  Procedure Laterality Date  . BACK SURGERY  1984   ruptured disc  . CYSTOCELE REPAIR N/A 09/01/2019   Procedure: ANTERIOR REPAIR (CYSTOCELE);  Surgeon: Newton Pigg, MD;  Location: [facility];  Service: Gynecology;  Laterality: N/A;  . HERNIA REPAIR  02/20/11   ventral hernia  . KNEE ARTHROSCOPY Left 08/17/2015   Procedure: Left Knee Arthroscopy and Debridement;  Surgeon: Newt Minion, MD;  Location: [place];  Service: Orthopedics;  Laterality: Left;  . KNEE ARTHROSCOPY WITH ANTERIOR CRUCIATE LIGAMENT (ACL) REPAIR Left 03/2010  . MASTECTOMY  10/30/10   right, total  . MASTECTOMY  05/22/10   left cancer/chemo and radiation  . nipple construction  08/2011   tattoo at another date  . PORT-A-CATH REMOVAL  01/09/2012   Procedure: MINOR REMOVAL PORT-A-CATH;  Surgeon: Haywood Lasso, MD;  Location: [place];  Service: General;  Laterality: N/A;  . PORTACATH PLACEMENT  12/17/09  . PUBOVAGINAL SLING N/A 09/01/2019   Procedure: Gaynelle Arabian;  Surgeon: Bjorn Loser, MD;  Location: [facility];  Service: Urology;  Laterality: N/A;  . RECONSTRUCTION BREAST W/ TRAM FLAP  02/20/11   bilateral  . RECTOCELE REPAIR N/A 09/01/2019   Procedure: POSSIBLE POSTERIOR REPAIR (RECTOCELE);  Surgeon: Newton Pigg, MD;  Location: [facility];  Service: Gynecology;  Laterality: N/A;  possible  . torn ligament  2002   left knee  . TUBAL LIGATION  2004  . VAGINAL HYSTERECTOMY N/A 09/01/2019   Procedure: HYSTERECTOMY VAGINAL;  Surgeon: Newton Pigg, MD;  Location: [facility];  Service: Gynecology;  Laterality: N/A;    Family History  Problem Relation Age of Onset  . COPD Father   . Hypertension Father   . Hypertension Mother   . Parkinson's disease Mother     Social History:  reports that she has never smoked.  She has never used smokeless tobacco. She reports  that she does not drink alcohol or use drugs.  Allergies: No Known Allergies   Review of Systems     Examination:   BP 120/68   Pulse 93   Ht 5\' 3"  (1.6 m)   Wt 153 lb 9.6 oz (69.7 kg)   SpO2 97%   BMI 27.21 kg/m   Not hyperkinetic  No swelling of the eyelids   Neck: The thyroid is enlarged bilaterally Right lobe is smoother and about twice normal Left lobe is about 2-1/2 times normal, former and appears to have nodular area in the medial part of the lobe          No tremor present Biceps reflexes are brisk   Assessment/Plan:   Hyperthyroidism, from Graves' disease   She has significant symptoms along with a goiter and significantly high thyrotropin receptor antibodies  Her symptoms are improving with methimazole 30 mg daily However she still is somewhat symptomatic and has not started regaining the weight she has lost Her goiter is not improved in size Pulse is relatively fast despite taking metoprolol Also thyrotropin receptor antibody is slightly higher  Given her overall clinical picture she is likely to need I-131 treatment and unlikely to get remission with methimazole alone She understands this and since her mother also had I-131 treatment she is willing to be scheduled for this  However since her thyroid levels are still high especially T3 levels will try to normalize her thyroid levels before scheduling I-131 treatment Discussed that she would need to stop methimazole for a week prior to both the uptake and treatment appointments Also will need to do a thyroid scan to evaluate her goiter since her left lobe appears to be firmer medially and nodular  She will increase her methimazole to 20 mg twice daily instead of 30 mg daily Continue metoprolol  ABNORMAL alkaline phosphatase: This may be related to hyperthyroidism since it is recent but since levels are increasing will need to consider further evaluation  if persistent on the next visit  Follow-up in 5 weeks    Vendetta Pittinger 11/10/2019, 3:00 PM    Note: This office note was prepared with Dragon voice recognition system technology. Any transcriptional errors that result from this process are unintentional.

## 2019-11-15 ENCOUNTER — Telehealth: Payer: Self-pay | Admitting: Endocrinology

## 2019-11-15 ENCOUNTER — Other Ambulatory Visit: Payer: Self-pay

## 2019-11-15 MED ORDER — METHIMAZOLE 10 MG PO TABS: ORAL_TABLET | ORAL | 2 refills | Status: DC

## 2019-11-15 MED FILL — methIMAzole 10 MG TABS: 10 | 30 days supply | Qty: 120 | Fill #0

## 2019-11-15 MED FILL — METOPROLOL SUCCINATE ER 50: 50 | 30 days supply | Qty: 30 | Fill #1

## 2019-11-15 NOTE — Telephone Encounter (Signed)
Received fax from pt's pharmacy and this was corrected.

## 2019-11-15 NOTE — Telephone Encounter (Signed)
Patient needs an updated RX for Methimazole sent to the [facility] on [address]. Her RX changed at her last visit with Dr Dwyane Dee

## 2019-11-16 ENCOUNTER — Encounter: Payer: Self-pay | Admitting: Family Medicine

## 2019-11-16 ENCOUNTER — Ambulatory Visit (INDEPENDENT_AMBULATORY_CARE_PROVIDER_SITE_OTHER): Payer: 59 | Admitting: Family Medicine

## 2019-11-16 ENCOUNTER — Other Ambulatory Visit: Payer: Self-pay

## 2019-11-16 VITALS — BP 128/81 | HR 76 | Temp 98.0°F | Resp 20 | Ht 63.0 in | Wt 154.0 lb

## 2019-11-16 DIAGNOSIS — E0590 Thyrotoxicosis, unspecified without thyrotoxic crisis or storm: Secondary | ICD-10-CM

## 2019-11-16 DIAGNOSIS — I10 Essential (primary) hypertension: Secondary | ICD-10-CM | POA: Diagnosis not present

## 2019-11-16 NOTE — Patient Instructions (Signed)
DASH Eating Plan DASH stands for "Dietary Approaches to Stop Hypertension." The DASH eating plan is a healthy eating plan that has been shown to reduce high blood pressure (hypertension). Additional health benefits may include reducing the risk of type 2 diabetes mellitus, heart disease, and stroke. The DASH eating plan may also help with weight loss.  WHAT DO I NEED TO KNOW ABOUT THE DASH EATING PLAN? For the DASH eating plan, you will follow these general guidelines:  Choose foods with a percent daily value for sodium of less than 5% (as listed on the food label).  Use salt-free seasonings or herbs instead of table salt or sea salt.  Check with your health care provider or pharmacist before using salt substitutes.  Eat lower-sodium products, often labeled as "lower sodium" or "no salt added."  Eat fresh foods.  Eat more vegetables, fruits, and low-fat dairy products.  Choose whole grains. Look for the word "whole" as the first word in the ingredient list.  Choose fish and skinless chicken or turkey more often than red meat. Limit fish, poultry, and meat to 6 oz (170 g) each day.  Limit sweets, desserts, sugars, and sugary drinks.  Choose heart-healthy fats.  Limit cheese to 1 oz (28 g) per day.  Eat more home-cooked food and less restaurant, buffet, and fast food.  Limit fried foods.  Cook foods using methods other than frying.  Limit canned vegetables. If you do use them, rinse them well to decrease the sodium.  When eating at a restaurant, ask that your food be prepared with less salt, or no salt if possible.  WHAT FOODS CAN I EAT? Seek help from a dietitian for individual calorie needs.  Grains Whole grain or whole wheat bread. Brown rice. Whole grain or whole wheat pasta. Quinoa, bulgur, and whole grain cereals. Low-sodium cereals. Corn or whole wheat flour tortillas. Whole grain cornbread. Whole grain crackers. Low-sodium crackers.  Vegetables Fresh or frozen  vegetables (raw, steamed, roasted, or grilled). Low-sodium or reduced-sodium tomato and vegetable juices. Low-sodium or reduced-sodium tomato sauce and paste. Low-sodium or reduced-sodium canned vegetables.   Fruits All fresh, canned (in natural juice), or frozen fruits.  Meat and Other Protein Products Ground beef (85% or leaner), grass-fed beef, or beef trimmed of fat. Skinless chicken or turkey. Ground chicken or turkey. Pork trimmed of fat. All fish and seafood. Eggs. Dried beans, peas, or lentils. Unsalted nuts and seeds. Unsalted canned beans.  Dairy Low-fat dairy products, such as skim or 1% milk, 2% or reduced-fat cheeses, low-fat ricotta or cottage cheese, or plain low-fat yogurt. Low-sodium or reduced-sodium cheeses.  Fats and Oils Tub margarines without trans fats. Light or reduced-fat mayonnaise and salad dressings (reduced sodium). Avocado. Safflower, olive, or canola oils. Natural peanut or almond butter.  Other Unsalted popcorn and pretzels. The items listed above may not be a complete list of recommended foods or beverages. Contact your dietitian for more options.  WHAT FOODS ARE NOT RECOMMENDED?  Grains White bread. White pasta. White rice. Refined cornbread. Bagels and croissants. Crackers that contain trans fat.  Vegetables Creamed or fried vegetables. Vegetables in a cheese sauce. Regular canned vegetables. Regular canned tomato sauce and paste. Regular tomato and vegetable juices.  Fruits Dried fruits. Canned fruit in light or heavy syrup. Fruit juice.  Meat and Other Protein Products Fatty cuts of meat. Ribs, chicken wings, bacon, sausage, bologna, salami, chitterlings, fatback, hot dogs, bratwurst, and packaged luncheon meats. Salted nuts and seeds. Canned beans with salt.    Dairy Whole or 2% milk, cream, half-and-half, and cream cheese. Whole-fat or sweetened yogurt. Full-fat cheeses or blue cheese. Nondairy creamers and whipped toppings. Processed cheese,  cheese spreads, or cheese curds.  Condiments Onion and garlic salt, seasoned salt, table salt, and sea salt. Canned and packaged gravies. Worcestershire sauce. Tartar sauce. Barbecue sauce. Teriyaki sauce. Soy sauce, including reduced sodium. Steak sauce. Fish sauce. Oyster sauce. Cocktail sauce. Horseradish. Ketchup and mustard. Meat flavorings and tenderizers. Bouillon cubes. Hot sauce. Tabasco sauce. Marinades. Taco seasonings. Relishes.  Fats and Oils Butter, stick margarine, lard, shortening, ghee, and bacon fat. Coconut, palm kernel, or palm oils. Regular salad dressings.  Other Pickles and olives. Salted popcorn and pretzels.  The items listed above may not be a complete list of foods and beverages to avoid. Contact your dietitian for more information.  WHERE CAN I FIND MORE INFORMATION? National Heart, Lung, and Blood Institute: www.nhlbi.nih.gov/health/health-topics/topics/dash/ Document Released: 09/25/2011 Document Revised: 02/20/2014 Document Reviewed: 08/10/2013 ExitCare Patient Information 2015 [employer]. This information is not intended to replace advice given to you by your health care provider. Make sure you discuss any questions you have with your health care provider.   I think that you would greatly benefit from seeing a nutritionist.  If you are interested, please call Dr Sykes at [phone] to schedule an appointment.   

## 2019-11-16 NOTE — Progress Notes (Signed)
Subjective:  Patient ID: Andrea Castro, female    DOB: November 23, 1955, 64 y.o.   MRN: [id number]  Patient Care Team: Baruch Gouty, FNP as PCP - General (Family Medicine) Eston Esters, MD (Inactive) (Hematology and Oncology)   Chief Complaint:  Hypertension (4 week )   HPI: Andrea Castro is a 64 y.o. female presenting on 11/16/2019 for Hypertension (4 week )   Hypertension This is a new problem. The current episode started more than 1 month ago. The problem has been rapidly improving since onset. The problem is controlled. Pertinent negatives include no anxiety, blurred vision, chest pain, headaches, malaise/fatigue, neck pain, orthopnea, palpitations, peripheral edema, PND, shortness of breath or sweats. Past treatments include diuretics and beta blockers. The current treatment provides significant improvement. There are no compliance problems.  Identifiable causes of hypertension include a thyroid problem.      Relevant past medical, surgical, family, and social history reviewed and updated as indicated.  Allergies and medications reviewed and updated. Date reviewed: Chart in Epic.   Past Medical History:  Diagnosis Date  . ACL (anterior cruciate ligament) tear 2002  . Arthritis   . Atypical nevus 05/27/1996   Right Rim Ear-Slight  . Cancer [facility]) 2011   left breast  . Complication of anesthesia   . History of breast cancer   . Hyphema of right eye   . PONV (postoperative nausea and vomiting)   . Ruptured disc, thoracic 08/1983    Past Surgical History:  Procedure Laterality Date  . BACK SURGERY  1984   ruptured disc  . CYSTOCELE REPAIR N/A 09/01/2019   Procedure: ANTERIOR REPAIR (CYSTOCELE);  Surgeon: Newton Pigg, MD;  Location: [facility];  Service: Gynecology;  Laterality: N/A;  . HERNIA REPAIR  02/20/11   ventral hernia  . KNEE ARTHROSCOPY Left 08/17/2015   Procedure: Left Knee Arthroscopy and Debridement;  Surgeon: Newt Minion, MD;   Location: [facility];  Service: Orthopedics;  Laterality: Left;  . KNEE ARTHROSCOPY WITH ANTERIOR CRUCIATE LIGAMENT (ACL) REPAIR Left 03/2010  . MASTECTOMY  10/30/10   right, total  . MASTECTOMY  05/22/10   left cancer/chemo and radiation  . nipple construction  08/2011   tattoo at another date  . PORT-A-CATH REMOVAL  01/09/2012   Procedure: MINOR REMOVAL PORT-A-CATH;  Surgeon: Haywood Lasso, MD;  Location: [place];  Service: General;  Laterality: N/A;  . PORTACATH PLACEMENT  12/17/09  . PUBOVAGINAL SLING N/A 09/01/2019   Procedure: Gaynelle Arabian;  Surgeon: Bjorn Loser, MD;  Location: [facility];  Service: Urology;  Laterality: N/A;  . RECONSTRUCTION BREAST W/ TRAM FLAP  02/20/11   bilateral  . RECTOCELE REPAIR N/A 09/01/2019   Procedure: POSSIBLE POSTERIOR REPAIR (RECTOCELE);  Surgeon: Newton Pigg, MD;  Location: [facility];  Service: Gynecology;  Laterality: N/A;  possible  . torn ligament  2002   left knee  . TUBAL LIGATION  2004  . VAGINAL HYSTERECTOMY N/A 09/01/2019   Procedure: HYSTERECTOMY VAGINAL;  Surgeon: Newton Pigg, MD;  Location: [facility];  Service: Gynecology;  Laterality: N/A;    Social History   Socioeconomic History  . Marital status: Married    Spouse name: Not on file  . Number of children: Not on file  . Years of education: Not on file  . Highest education level: Not on file  Occupational History  . Not on file  Tobacco Use  . Smoking status:  Never Smoker  . Smokeless tobacco: Never Used  Substance and Sexual Activity  . Alcohol use: No  . Drug use: No  . Sexual activity: Yes    Birth control/protection: Post-menopausal  Other Topics Concern  . Not on file  Social History Narrative  . Not on file   Social Determinants of Health   Financial Resource Strain:   . Difficulty of Paying Living Expenses: Not on file  Food Insecurity:   . Worried About Charity fundraiser in  the Last Year: Not on file  . Ran Out of Food in the Last Year: Not on file  Transportation Needs:   . Lack of Transportation (Medical): Not on file  . Lack of Transportation (Non-Medical): Not on file  Physical Activity:   . Days of Exercise per Week: Not on file  . Minutes of Exercise per Session: Not on file  Stress:   . Feeling of Stress : Not on file  Social Connections:   . Frequency of Communication with Friends and Family: Not on file  . Frequency of Social Gatherings with Friends and Family: Not on file  . Attends Religious Services: Not on file  . Active Member of Clubs or Organizations: Not on file  . Attends Archivist Meetings: Not on file  . Marital Status: Not on file  Intimate Partner Violence:   . Fear of Current or Ex-Partner: Not on file  . Emotionally Abused: Not on file  . Physically Abused: Not on file  . Sexually Abused: Not on file    Outpatient Encounter Medications as of 11/16/2019  Medication Sig  . aspirin EC 81 MG tablet Take 1 tablet (81 mg total) by mouth daily.  . Biotin 2500 MCG CAPS Take 5,000 mcg by mouth daily.   . calcium-vitamin D (OSCAL WITH D) 500-200 MG-UNIT per tablet Take 1 tablet by mouth 2 (two) times daily.   . ferrous sulfate 325 (65 FE) MG tablet Take 325 mg by mouth daily with breakfast.  . fish oil-omega-3 fatty acids 1000 MG capsule Take 1 g by mouth 2 (two) times daily.   . Glucosamine HCl 1000 MG TABS Take 1,000 mg by mouth 2 (two) times daily.   Marland Kitchen ibuprofen (ADVIL) 800 MG tablet Take 1 tablet (800 mg total) by mouth every 8 (eight) hours as needed.  . loratadine (CLARITIN) 10 MG tablet Take 10 mg by mouth daily as needed for allergies.  . methimazole (TAPAZOLE) 10 MG tablet Take 2 tablets by mouth twice daily.  . metoprolol succinate (TOPROL XL) 50 MG 24 hr tablet Take 1 tablet (50 mg total) by mouth daily. Take with or immediately following a meal.  . Multiple Vitamin (MULTIVITAMIN PO) Take 1 tablet by mouth daily.     . Probiotic Product (PROBIOTIC DAILY PO) Take 1 capsule by mouth daily.  Marland Kitchen zinc gluconate 50 MG tablet Take 50 mg by mouth daily.  . [DISCONTINUED] lisinopril (ZESTRIL) 5 MG tablet Take 1 tablet (5 mg total) by mouth daily.   No facility-administered encounter medications on file as of 11/16/2019.    No Known Allergies  Review of Systems  Constitutional: Negative for activity change, appetite change, chills, diaphoresis, fatigue, fever, malaise/fatigue and unexpected weight change.  HENT: Negative.   Eyes: Negative.  Negative for blurred vision, photophobia and visual disturbance.  Respiratory: Negative for cough, chest tightness and shortness of breath.   Cardiovascular: Negative for chest pain, palpitations, orthopnea, leg swelling and PND.  Gastrointestinal: Negative for  abdominal pain, blood in stool, constipation, diarrhea, nausea and vomiting.  Endocrine: Negative.   Genitourinary: Negative for decreased urine volume, difficulty urinating, dysuria, frequency and urgency.  Musculoskeletal: Negative for arthralgias, myalgias and neck pain.  Skin: Negative.   Allergic/Immunologic: Negative.   Neurological: Positive for tremors. Negative for dizziness, seizures, syncope, facial asymmetry, speech difficulty, weakness, light-headedness, numbness and headaches.  Hematological: Negative.   Psychiatric/Behavioral: Negative for confusion, hallucinations, sleep disturbance and suicidal ideas.  All other systems reviewed and are negative.       Objective:  BP 128/81   Pulse 76   Temp 98 F (36.7 C)   Resp 20   Ht 5\' 3"  (1.6 m)   Wt 154 lb (69.9 kg)   SpO2 98%   BMI 27.28 kg/m    Wt Readings from Last 3 Encounters:  11/16/19 154 lb (69.9 kg)  11/10/19 153 lb 9.6 oz (69.7 kg)  10/19/19 155 lb 3.2 oz (70.4 kg)    Physical Exam Vitals and nursing note reviewed.  Constitutional:      General: She is not in acute distress.    Appearance: Normal appearance. She is  well-developed, well-groomed and overweight. She is not ill-appearing, toxic-appearing or diaphoretic.  HENT:     Head: Normocephalic and atraumatic.     Jaw: There is normal jaw occlusion.     Right Ear: Hearing normal.     Left Ear: Hearing normal.     Nose: Nose normal.     Mouth/Throat:     Lips: Pink.     Mouth: Mucous membranes are moist.     Pharynx: Oropharynx is clear. Uvula midline.  Eyes:     General: Lids are normal.     Extraocular Movements: Extraocular movements intact.     Conjunctiva/sclera: Conjunctivae normal.     Pupils: Pupils are equal, round, and reactive to light.  Neck:     Thyroid: Thyroid mass and thyromegaly present. No thyroid tenderness.     Vascular: No carotid bruit or JVD.     Trachea: Trachea and phonation normal.  Cardiovascular:     Rate and Rhythm: Normal rate and regular rhythm.     Chest Wall: PMI is not displaced.     Pulses: Normal pulses.     Heart sounds: Normal heart sounds. No murmur. No friction rub. No gallop.   Pulmonary:     Effort: Pulmonary effort is normal. No respiratory distress.     Breath sounds: Normal breath sounds. No wheezing.  Chest:     Comments: Bilateral mastectomy with implants Abdominal:     General: Bowel sounds are normal. There is no distension or abdominal bruit.     Palpations: Abdomen is soft. There is no hepatomegaly or splenomegaly.     Tenderness: There is no abdominal tenderness. There is no right CVA tenderness or left CVA tenderness.     Hernia: No hernia is present.  Musculoskeletal:        General: Normal range of motion.     Cervical back: Normal range of motion and neck supple.     Right lower leg: No edema.     Left lower leg: No edema.  Lymphadenopathy:     Cervical: No cervical adenopathy.  Skin:    General: Skin is warm and dry.     Capillary Refill: Capillary refill takes less than 2 seconds.     Coloration: Skin is not cyanotic, jaundiced or pale.     Findings: No rash.  Neurological:     General: No focal deficit present.     Mental Status: She is alert and oriented to person, place, and time.     Cranial Nerves: Cranial nerves are intact. No cranial nerve deficit.     Sensory: Sensation is intact. No sensory deficit.     Motor: Tremor (fine) present. No weakness.     Coordination: Coordination is intact. Coordination normal.     Gait: Gait is intact. Gait normal.     Deep Tendon Reflexes: Reflexes are normal and symmetric. Reflexes normal.  Psychiatric:        Attention and Perception: Attention and perception normal.        Mood and Affect: Mood and affect normal.        Speech: Speech normal.        Behavior: Behavior normal. Behavior is cooperative.        Thought Content: Thought content normal.        Cognition and Memory: Cognition and memory normal.        Judgment: Judgment normal.     Results for orders placed or performed in visit on 11/07/19  Comprehensive metabolic panel  Result Value Ref Range   Sodium 137 135 - 145 mEq/L   Potassium 3.7 3.5 - 5.1 mEq/L   Chloride 105 96 - 112 mEq/L   CO2 25 19 - 32 mEq/L   Glucose, Bld 124 (H) 70 - 99 mg/dL   BUN 16 6 - 23 mg/dL   Creatinine, Ser 0.55 0.40 - 1.20 mg/dL   Total Bilirubin 0.5 0.2 - 1.2 mg/dL   Alkaline Phosphatase 226 (H) 39 - 117 U/L   AST 25 0 - 37 U/L   ALT 32 0 - 35 U/L   Total Protein 7.3 6.0 - 8.3 g/dL   Albumin 3.8 3.5 - 5.2 g/dL   GFR 111.88 >60.00 mL/min   Calcium 9.4 8.4 - 10.5 mg/dL  CBC  Result Value Ref Range   WBC 5.7 4.0 - 10.5 K/uL   RBC 4.19 3.87 - 5.11 Mil/uL   Platelets 236.0 150.0 - 400.0 K/uL   Hemoglobin 11.9 (L) 12.0 - 15.0 g/dL   HCT 36.4 36.0 - 46.0 %   MCV 86.9 78.0 - 100.0 fl   MCHC 32.7 30.0 - 36.0 g/dL   RDW 14.7 11.5 - 15.5 %  free T3  Result Value Ref Range   T3, Free 7.2 (H) 2.3 - 4.2 pg/mL  T4, free  Result Value Ref Range   Free T4 1.91 (H) 0.60 - 1.60 ng/dL  Thyrotropin receptor autoabs  Result Value Ref Range   Thyrotropin  Receptor Ab 36.20 (H) 0.00 - 1.75 IU/L       Pertinent labs & imaging results that were available during my care of the patient were reviewed by me and considered in my medical decision making.  Assessment & Plan:  Andrea Castro was seen today for hypertension.  Diagnoses and all orders for this visit:  Essential hypertension Switched to metoprolol by endocrinology for dual benefit, tremor and hypertension. BP is well controlled on current regimen. DASH diet discussed. Pt is scheduled for RAI therapy for thyrotoxicosis. Hopefully will be able to wean off of BP medications once therapy has been completed.   Thyrotoxicosis w/o crisis Followed by Dr. Dwyane Dee. Pt is scheduled to have RAI therapy for definitive treatment. Has upcoming with Dr. Dwyane Dee and Cardiology.     Continue all other maintenance medications.  Follow up plan: Return in about  3 months (around 02/14/2020), or if symptoms worsen or fail to improve, for HTN.  Continue healthy lifestyle choices, including diet (rich in fruits, vegetables, and lean proteins, and low in salt and simple carbohydrates) and exercise (at least 30 minutes of moderate physical activity daily).  Educational handout given for DASH   The above assessment and management plan was discussed with the patient. The patient verbalized understanding of and has agreed to the management plan. Patient is aware to call the clinic if they develop any new symptoms or if symptoms persist or worsen. Patient is aware when to return to the clinic for a follow-up visit. Patient educated on when it is appropriate to go to the emergency department.   Monia Pouch, FNP-C [facility] [phone]

## 2019-11-18 ENCOUNTER — Other Ambulatory Visit: Payer: Self-pay

## 2019-11-18 ENCOUNTER — Ambulatory Visit: Payer: 59 | Admitting: Internal Medicine

## 2019-11-18 ENCOUNTER — Ambulatory Visit (INDEPENDENT_AMBULATORY_CARE_PROVIDER_SITE_OTHER): Payer: 59 | Admitting: Internal Medicine

## 2019-11-18 ENCOUNTER — Encounter: Payer: Self-pay | Admitting: Internal Medicine

## 2019-11-18 VITALS — BP 170/94 | Temp 97.2°F | Ht 64.0 in | Wt 157.2 lb

## 2019-11-18 DIAGNOSIS — Z853 Personal history of malignant neoplasm of breast: Secondary | ICD-10-CM | POA: Diagnosis not present

## 2019-11-18 DIAGNOSIS — N8111 Cystocele, midline: Secondary | ICD-10-CM | POA: Diagnosis not present

## 2019-11-18 DIAGNOSIS — I10 Essential (primary) hypertension: Secondary | ICD-10-CM | POA: Diagnosis not present

## 2019-11-18 DIAGNOSIS — N3946 Mixed incontinence: Secondary | ICD-10-CM | POA: Diagnosis not present

## 2019-11-18 DIAGNOSIS — I517 Cardiomegaly: Secondary | ICD-10-CM | POA: Diagnosis not present

## 2019-11-18 NOTE — Progress Notes (Signed)
Cardiology Office Note:    Date:  11/18/2019   ID:  Andrea Castro, DOB 1956-02-02, MRN [id number]  PCP:  Baruch Gouty, FNP  Cardiologist:  No primary care provider on file.  Electrophysiologist:  None   Referring MD: Baruch Gouty, FNP   Chief Complaint: HTN, hx of breast cancer  History of Present Illness:    Andrea Castro is a 64 y.o. female with a hx of left breast upper outer quadrant and left axillary needle core biopsy on 11/30/2009, both positive for a clinical T3 N1-2, stage IIIA invasive ductal carcinoma, estrogen receptor negative, progesterone receptor negativestatus post chemotherapy and radiation, with a regimen including docetaxel, carboplatin and trastuzumab x 6 cycles from 12/28/2009 through 04/19/2010 with Neulasta support.  Trastuzumab was continued to complete one year (to 12/27/2010).  She is status post bilateral mastectomy, and completed oral anastrozole therapy.  She is being seen for possible hypertension however in the setting of hyperthyroidism currently on methimazole.  She has no significant cardiovascular symptoms, she has optimized her diet and walks daily up to 3 miles.  She has no significant fatigue, no chest pain, no significant shortness of breath.  With her hyperthyroidism she experiences tremor.  She has been told she has hypertension, however her endocrinologist has discontinued her lisinopril with the thought that her blood pressure elevation is related to hyperthyroidism.  She continues on metoprolol for heart rate and tremor.  She was referred by her primary care provider given concerns of ECG abnormality.  On ECG today she has possible left ventricular hypertrophy  Past Medical History:  Diagnosis Date  . ACL (anterior cruciate ligament) tear 2002  . Arthritis   . Atypical nevus 05/27/1996   Right Rim Ear-Slight  . Cancer [facility]) 2011   left breast  . Complication of anesthesia   . History of breast cancer   . Hyphema of right eye   .  PONV (postoperative nausea and vomiting)   . Ruptured disc, thoracic 08/1983    Past Surgical History:  Procedure Laterality Date  . BACK SURGERY  1984   ruptured disc  . CYSTOCELE REPAIR N/A 09/01/2019   Procedure: ANTERIOR REPAIR (CYSTOCELE);  Surgeon: Newton Pigg, MD;  Location: [facility];  Service: Gynecology;  Laterality: N/A;  . HERNIA REPAIR  02/20/11   ventral hernia  . KNEE ARTHROSCOPY Left 08/17/2015   Procedure: Left Knee Arthroscopy and Debridement;  Surgeon: Newt Minion, MD;  Location: [place];  Service: Orthopedics;  Laterality: Left;  . KNEE ARTHROSCOPY WITH ANTERIOR CRUCIATE LIGAMENT (ACL) REPAIR Left 03/2010  . MASTECTOMY  10/30/10   right, total  . MASTECTOMY  05/22/10   left cancer/chemo and radiation  . nipple construction  08/2011   tattoo at another date  . PORT-A-CATH REMOVAL  01/09/2012   Procedure: MINOR REMOVAL PORT-A-CATH;  Surgeon: Haywood Lasso, MD;  Location: [place];  Service: General;  Laterality: N/A;  . PORTACATH PLACEMENT  12/17/09  . PUBOVAGINAL SLING N/A 09/01/2019   Procedure: Gaynelle Arabian;  Surgeon: Bjorn Loser, MD;  [facility]: [facility];  Service: Urology;  Laterality: N/A;  . RECONSTRUCTION BREAST W/ TRAM FLAP  02/20/11   bilateral  . RECTOCELE REPAIR N/A 09/01/2019   Procedure: POSSIBLE POSTERIOR REPAIR (RECTOCELE);  Surgeon: Newton Pigg, MD;  Location: [facility];  Service: Gynecology;  Laterality: N/A;  possible  . torn ligament  2002   left knee  . TUBAL LIGATION  2004  .  VAGINAL HYSTERECTOMY N/A 09/01/2019   Procedure: HYSTERECTOMY VAGINAL;  Surgeon: Newton Pigg, MD;  Location: [facility];  Service: Gynecology;  Laterality: N/A;    Current Medications: Current Meds  Medication Sig  . aspirin EC 81 MG tablet Take 1 tablet (81 mg total) by mouth daily.  . Biotin 2500 MCG CAPS Take 5,000 mcg by mouth daily.   . calcium-vitamin D (OSCAL  WITH D) 500-200 MG-UNIT per tablet Take 1 tablet by mouth 2 (two) times daily.   . ferrous sulfate 325 (65 FE) MG tablet Take 325 mg by mouth daily with breakfast.  . fish oil-omega-3 fatty acids 1000 MG capsule Take 1 g by mouth 2 (two) times daily.   . Glucosamine HCl 1000 MG TABS Take 1,000 mg by mouth 2 (two) times daily.   Marland Kitchen ibuprofen (ADVIL) 800 MG tablet Take 1 tablet (800 mg total) by mouth every 8 (eight) hours as needed.  . loratadine (CLARITIN) 10 MG tablet Take 10 mg by mouth daily as needed for allergies.  . methimazole (TAPAZOLE) 10 MG tablet Take 2 tablets by mouth twice daily.  . metoprolol succinate (TOPROL XL) 50 MG 24 hr tablet Take 1 tablet (50 mg total) by mouth daily. Take with or immediately following a meal.  . Multiple Vitamin (MULTIVITAMIN PO) Take 1 tablet by mouth daily.   . Probiotic Product (PROBIOTIC DAILY PO) Take 1 capsule by mouth daily.  Marland Kitchen zinc gluconate 50 MG tablet Take 50 mg by mouth daily.     Allergies:   Patient has no known allergies.   Social History   Socioeconomic History  . Marital status: Married    Spouse name: Not on file  . Number of children: Not on file  . Years of education: Not on file  . Highest education level: Not on file  Occupational History  . Not on file  Tobacco Use  . Smoking status: Never Smoker  . Smokeless tobacco: Never Used  Substance and Sexual Activity  . Alcohol use: No  . Drug use: No  . Sexual activity: Yes    Birth control/protection: Post-menopausal  Other Topics Concern  . Not on file  Social History Narrative  . Not on file   Social Determinants of Health   Financial Resource Strain:   . Difficulty of Paying Living Expenses: Not on file  Food Insecurity:   . Worried About Charity fundraiser in the Last Year: Not on file  . Ran Out of Food in the Last Year: Not on file  Transportation Needs:   . Lack of Transportation (Medical): Not on file  . Lack of Transportation (Non-Medical): Not on file   Physical Activity:   . Days of Exercise per Week: Not on file  . Minutes of Exercise per Session: Not on file  Stress:   . Feeling of Stress : Not on file  Social Connections:   . Frequency of Communication with Friends and Family: Not on file  . Frequency of Social Gatherings with Friends and Family: Not on file  . Attends Religious Services: Not on file  . Active Member of Clubs or Organizations: Not on file  . Attends Archivist Meetings: Not on file  . Marital Status: Not on file     Family History: The patient's family history includes COPD in her father; Hypertension in her father and mother; Parkinson's disease in her mother.  ROS:   Please see the history of present illness.  All other systems reviewed and are negative.  EKGs/Labs/Other Studies Reviewed:    The following studies were reviewed today:  EKG: Normal sinus rhythm, possible LVH  Recent Labs: 10/11/2019: TSH <0.005 11/07/2019: ALT 32; BUN 16; Creatinine, Ser 0.55; Hemoglobin 11.9; Platelets 236.0; Potassium 3.7; Sodium 137  Recent Lipid Panel    Component Value Date/Time   CHOL 143 09/28/2019 1540   TRIG 75 09/28/2019 1540   HDL 61 09/28/2019 1540   CHOLHDL 2.3 09/28/2019 1540   LDLCALC 67 09/28/2019 1540    Physical Exam:    VS:  BP (!) 170/94 (BP Location: Left Arm)   Temp (!) 97.2 F (36.2 C)   Ht 5\' 4"  (1.626 m)   Wt 157 lb 3.2 oz (71.3 kg)   SpO2 100%   BMI 26.98 kg/m   P 75 BP 150/90  Wt Readings from Last 5 Encounters:  11/18/19 157 lb 3.2 oz (71.3 kg)  11/16/19 154 lb (69.9 kg)  11/10/19 153 lb 9.6 oz (69.7 kg)  10/19/19 155 lb 3.2 oz (70.4 kg)  10/17/19 155 lb (70.3 kg)     Constitutional: No acute distress Eyes: sclera non-icteric, normal conjunctiva and lids ENMT: normal dentition, moist mucous membranes Cardiovascular: regular rhythm, normal rate, no murmurs. S1 and S2 normal. Radial pulses normal bilaterally. No jugular venous distention.  Respiratory: clear to  auscultation bilaterally GI : normal bowel sounds, soft and nontender. No distention.   MSK: extremities warm, well perfused. No edema.  NEURO: grossly nonfocal exam, moves all extremities. PSYCH: alert and oriented x 3, normal mood and affect.   ASSESSMENT:    1. Essential hypertension   2. History of breast cancer   3. LVH (left ventricular hypertrophy)    PLAN:    HTN - BP is elevated today, she was taken off of lisinopril due to concerns that her hypertension was likely related to hyperthyroidism.  She continues on metoprolol for heart rate, tremor, and BP control.  Reasonable to observe this at this time, however I will bring her back for follow-up in a couple of weeks and double check her blood pressure.  If still elevated we can continue antihypertensive therapy until her thyroid function is normalized.  She is on methimazole for hyperthyroidism.  History of breast cancer-patient is largely asymptomatic, active, and eats a healthy diet.  With her history of breast cancer and receiving chemotherapy and radiation, it would be best to reevaluate with echocardiogram with strain to ensure appropriate follow-up after chemo.  We will obtain this echocardiogram with strain.  LVH on ECG-borderline criteria for LVH on ECG, we will use echo to sort this out.  Echocardiogram from 2012 shows wall thickness of 10 mm.  Echocardiogram from 2012 is also largely unremarkable.  Hyperthyroidism-per endocrinology.   Total time of encounter: 60 minutes total time of encounter, including 40 minutes spent in face-to-face patient care. This time includes coordination of care and counseling regarding post chemotherapy cardiovascular follow-up and management of hypertension. Remainder of non-face-to-face time involved reviewing chart documents/testing relevant to the patient encounter and documentation in the medical record.  Cherlynn Kaiser, MD [place]  [facility]   Medication Adjustments/Labs and  Tests Ordered: Current medicines are reviewed at length with the patient today.  Concerns regarding medicines are outlined above.  No orders of the defined types were placed in this encounter.  No orders of the defined types were placed in this encounter.   Patient Instructions  Medication Instructions:  No changes *If you  need a refill on your cardiac medications before your next appointment, please call your pharmacy*  Lab Work: Not needed  Testing/Procedures: Will be schedule at [place] has requested that you have an echocardiogram. Echocardiography is a painless test that uses sound waves to create images of your heart. It provides your doctor with information about the size and shape of your heart and how well your heart's chambers and valves are working. This procedure takes approximately one hour. There are no restrictions for this procedure.    Follow-Up: At [facility], you and your health needs are our priority.  As part of our continuing mission to provide you with exceptional heart care, we have created designated Provider Care Teams.  These Care Teams include your primary Cardiologist (physician) and Advanced Practice Providers (APPs -  Physician Assistants and Nurse Practitioners) who all work together to provide you with the care you need, when you need it.  Your next appointment:   2 week(s)  The format for your next appointment:   In Person  Provider:   Cherlynn Kaiser, MD  Other Instructions n/a

## 2019-11-18 NOTE — Patient Instructions (Signed)
Medication Instructions:  No changes *If you need a refill on your cardiac medications before your next appointment, please call your pharmacy*  Lab Work: Not needed  Testing/Procedures: Will be schedule at [place] has requested that you have an echocardiogram. Echocardiography is a painless test that uses sound waves to create images of your heart. It provides your doctor with information about the size and shape of your heart and how well your heart's chambers and valves are working. This procedure takes approximately one hour. There are no restrictions for this procedure.    Follow-Up: At [facility], you and your health needs are our priority.  As part of our continuing mission to provide you with exceptional heart care, we have created designated Provider Care Teams.  These Care Teams include your primary Cardiologist (physician) and Advanced Practice Providers (APPs -  Physician Assistants and Nurse Practitioners) who all work together to provide you with the care you need, when you need it.  Your next appointment:   2 week(s)  The format for your next appointment:   In Person  Provider:   Cherlynn Kaiser, MD  Other Instructions n/a

## 2019-11-29 ENCOUNTER — Ambulatory Visit (HOSPITAL_COMMUNITY): Payer: 59 | Attending: Cardiology

## 2019-11-29 ENCOUNTER — Ambulatory Visit: Payer: 59 | Admitting: Internal Medicine

## 2019-11-29 ENCOUNTER — Encounter: Payer: Self-pay | Admitting: Internal Medicine

## 2019-11-29 ENCOUNTER — Other Ambulatory Visit: Payer: Self-pay

## 2019-11-29 VITALS — BP 134/78 | HR 77 | Temp 97.9°F | Ht 64.0 in | Wt 157.4 lb

## 2019-11-29 DIAGNOSIS — I10 Essential (primary) hypertension: Secondary | ICD-10-CM | POA: Diagnosis not present

## 2019-11-29 DIAGNOSIS — Z853 Personal history of malignant neoplasm of breast: Secondary | ICD-10-CM | POA: Insufficient documentation

## 2019-11-29 DIAGNOSIS — I517 Cardiomegaly: Secondary | ICD-10-CM

## 2019-11-29 DIAGNOSIS — I152 Hypertension secondary to endocrine disorders: Secondary | ICD-10-CM | POA: Diagnosis not present

## 2019-11-29 NOTE — Progress Notes (Signed)
Cardiology Office Note:    Date:  11/29/2019   ID:  Andrea Castro, DOB 1956-02-15, MRN [id number]  PCP:  Baruch Gouty, FNP  Cardiologist:  Elouise Munroe, MD  Electrophysiologist:  None   Referring MD: Baruch Gouty, FNP   Chief Complaint: HTN, hx of breast cancer  History of Present Illness:    Andrea Castro is a 64 y.o. female with a history of left breast upper outer quadrant andleft axillary needle core biopsy on 11/30/2009, both positive for a clinical T3 N1-2, stage IIIA invasive ductal carcinoma, estrogen receptor negative, progesterone receptor negativestatus post chemotherapy and radiation, with a regimen including docetaxel,carboplatin and trastuzumabx 6 cycles from 12/28/2009 through 04/19/2010 with Neulasta support. Trastuzumab was continued to complete one year(to03/06/2011).  She is status post bilateral mastectomy, and completed oral anastrozole therapy. She also has a history of hyperthyroidism, awaiting possible ablative therapy, and likely secondary hypertension.   She remains symptom free and presents today to review her echocardiogram and BP.  Echo results discussed including normal EF and normal strain. BP is controlled today.   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  Past Medical History:  Diagnosis Date  . ACL (anterior cruciate ligament) tear 2002  . Arthritis   . Atypical nevus 05/27/1996   Right Rim Ear-Slight  . Cancer [facility]) 2011   left breast  . Complication of anesthesia   . History of breast cancer   . Hyphema of right eye   . PONV (postoperative nausea and vomiting)   . Ruptured disc, thoracic 08/1983    Past Surgical History:  Procedure Laterality Date  . BACK SURGERY  1984   ruptured disc  . CYSTOCELE REPAIR N/A 09/01/2019   Procedure: ANTERIOR REPAIR (CYSTOCELE);  Surgeon: Newton Pigg, MD;  Location: [facility];  Service: Gynecology;   Laterality: N/A;  . HERNIA REPAIR  02/20/11   ventral hernia  . KNEE ARTHROSCOPY Left 08/17/2015   Procedure: Left Knee Arthroscopy and Debridement;  Surgeon: Newt Minion, MD;  Location: [place];  Service: Orthopedics;  Laterality: Left;  . KNEE ARTHROSCOPY WITH ANTERIOR CRUCIATE LIGAMENT (ACL) REPAIR Left 03/2010  . MASTECTOMY  10/30/10   right, total  . MASTECTOMY  05/22/10   left cancer/chemo and radiation  . nipple construction  08/2011   tattoo at another date  . PORT-A-CATH REMOVAL  01/09/2012   Procedure: MINOR REMOVAL PORT-A-CATH;  Surgeon: Haywood Lasso, MD;  Location: [place];  Service: General;  Laterality: N/A;  . PORTACATH PLACEMENT  12/17/09  . PUBOVAGINAL SLING N/A 09/01/2019   Procedure: Gaynelle Arabian;  Surgeon: Bjorn Loser, MD;  [facility];  Service: Urology;  Laterality: N/A;  . RECONSTRUCTION BREAST W/ TRAM FLAP  02/20/11   bilateral  . RECTOCELE REPAIR N/A 09/01/2019   Procedure: POSSIBLE POSTERIOR REPAIR (RECTOCELE);  Surgeon: Newton Pigg, MD;  Location: [facility];  Service: Gynecology;  Laterality: N/A;  possible  . torn ligament  2002   left knee  . TUBAL LIGATION  2004  . VAGINAL HYSTERECTOMY N/A 09/01/2019   Procedure: HYSTERECTOMY VAGINAL;  Surgeon: Newton Pigg, MD;  Location: [facility];  Service: Gynecology;  Laterality: N/A;    Current Medications: Current Meds  Medication Sig  . aspirin EC 81 MG tablet Take 1 tablet (81 mg total) by mouth daily.  . Biotin 2500 MCG CAPS Take 5,000 mcg  by mouth daily.   . calcium-vitamin D (OSCAL WITH D) 500-200 MG-UNIT per tablet Take 1 tablet by mouth 2 (two) times daily.   . ferrous sulfate 325 (65 FE) MG tablet Take 325 mg by mouth daily with breakfast.  . fish oil-omega-3 fatty acids 1000 MG capsule Take 1 g by mouth 2 (two) times daily.   . Glucosamine HCl 1000 MG TABS Take 1,000 mg by mouth 2 (two) times daily.   Marland Kitchen ibuprofen  (ADVIL) 800 MG tablet Take 1 tablet (800 mg total) by mouth every 8 (eight) hours as needed.  . loratadine (CLARITIN) 10 MG tablet Take 10 mg by mouth daily as needed for allergies.  . methimazole (TAPAZOLE) 10 MG tablet Take 2 tablets by mouth twice daily.  . metoprolol succinate (TOPROL XL) 50 MG 24 hr tablet Take 1 tablet (50 mg total) by mouth daily. Take with or immediately following a meal.  . Multiple Vitamin (MULTIVITAMIN PO) Take 1 tablet by mouth daily.   . Probiotic Product (PROBIOTIC DAILY PO) Take 1 capsule by mouth daily.  Marland Kitchen zinc gluconate 50 MG tablet Take 50 mg by mouth daily.     Allergies:   Patient has no known allergies.   Social History   Socioeconomic History  . Marital status: Married    Spouse name: Not on file  . Number of children: Not on file  . Years of education: Not on file  . Highest education level: Not on file  Occupational History  . Not on file  Tobacco Use  . Smoking status: Never Smoker  . Smokeless tobacco: Never Used  Substance and Sexual Activity  . Alcohol use: No  . Drug use: No  . Sexual activity: Yes    Birth control/protection: Post-menopausal  Other Topics Concern  . Not on file  Social History Narrative  . Not on file   Social Determinants of Health   Financial Resource Strain:   . Difficulty of Paying Living Expenses: Not on file  Food Insecurity:   . Worried About Charity fundraiser in the Last Year: Not on file  . Ran Out of Food in the Last Year: Not on file  Transportation Needs:   . Lack of Transportation (Medical): Not on file  . Lack of Transportation (Non-Medical): Not on file  Physical Activity:   . Days of Exercise per Week: Not on file  . Minutes of Exercise per Session: Not on file  Stress:   . Feeling of Stress : Not on file  Social Connections:   . Frequency of Communication with Friends and Family: Not on file  . Frequency of Social Gatherings with Friends and Family: Not on file  . Attends Religious  Services: Not on file  . Active Member of Clubs or Organizations: Not on file  . Attends Archivist Meetings: Not on file  . Marital Status: Not on file     Family History: The patient's family history includes COPD in her father; Hypertension in her father and mother; Parkinson's disease in her mother.  ROS:   Please see the history of present illness.    All other systems reviewed and are negative.  EKGs/Labs/Other Studies Reviewed:    The following studies were reviewed today:  EKG:  Not performed today.   Recent Labs: 10/11/2019: TSH <0.005 11/07/2019: ALT 32; BUN 16; Creatinine, Ser 0.55; Hemoglobin 11.9; Platelets 236.0; Potassium 3.7; Sodium 137  Recent Lipid Panel    Component Value Date/Time  CHOL 143 09/28/2019 1540   TRIG 75 09/28/2019 1540   HDL 61 09/28/2019 1540   CHOLHDL 2.3 09/28/2019 1540   LDLCALC 67 09/28/2019 1540    Physical Exam:    VS:  BP 134/78   Pulse 77   Temp 97.9 F (36.6 C)   Ht 5\' 4"  (1.626 m)   Wt 157 lb 6.4 oz (71.4 kg)   SpO2 97%   BMI 27.02 kg/m     Wt Readings from Last 5 Encounters:  11/29/19 157 lb 6.4 oz (71.4 kg)  11/18/19 157 lb 3.2 oz (71.3 kg)  11/16/19 154 lb (69.9 kg)  11/10/19 153 lb 9.6 oz (69.7 kg)  10/19/19 155 lb 3.2 oz (70.4 kg)     Constitutional: No acute distress Eyes: sclera non-icteric, normal conjunctiva and lids ENMT: normal dentition, moist mucous membranes Cardiovascular: regular rhythm, normal rate, no murmurs. S1 and S2 normal. Radial pulses normal bilaterally. No jugular venous distention.  Respiratory: clear to auscultation bilaterally GI : normal bowel sounds, soft and nontender. No distention.   MSK: extremities warm, well perfused. No edema.  NEURO: grossly nonfocal exam, moves all extremities. PSYCH: alert and oriented x 3, normal mood and affect.      ASSESSMENT:    1. Hypertension due to endocrine disorder   2. History of breast cancer   3. LVH (left ventricular  hypertrophy)    PLAN:    HTN - continue metoprolol, ok to hold lisinopril currently, will follow over time if BP continues to be elevated after therapy for hyperthyroidism.   LVH - moderate by echo, will need to keep a close eye on BP as this is likely the contributor and she may infact have underlying essential hypertension.   Hx of breast cancer- normal EF and strain, continue to observe cardiovascular function over time.   Total time of encounter: 30 minutes total time of encounter, including 20 minutes spent in face-to-face patient care. This time includes coordination of care and counseling regarding above mentioned problem list. Remainder of non-face-to-face time involved reviewing chart documents/testing relevant to the patient encounter and documentation in the medical record. I have independently reviewed documentation from referring provider.   Cherlynn Kaiser, MD [place]  [facility]    Medication Adjustments/Labs and Tests Ordered: Current medicines are reviewed at length with the patient today.  Concerns regarding medicines are outlined above.  No orders of the defined types were placed in this encounter.  No orders of the defined types were placed in this encounter.   Patient Instructions  Medication Instructions:  No changes *If you need a refill on your cardiac medications before your next appointment, please call your pharmacy*  Lab Work: Not needed  Testing/Procedures: Not needed  Follow-Up: At [facility], you and your health needs are our priority.  As part of our continuing mission to provide you with exceptional heart care, we have created designated Provider Care Teams.  These Care Teams include your primary Cardiologist (physician) and Advanced Practice Providers (APPs -  Physician Assistants and Nurse Practitioners) who all work together to provide you with the care you need, when you need it.  Your next appointment:   2 month(s)  The  format for your next appointment:   In Person  Provider:   Cherlynn Kaiser, MD  Other Instructions n/a

## 2019-11-29 NOTE — Patient Instructions (Signed)
Medication Instructions:  No changes *If you need a refill on your cardiac medications before your next appointment, please call your pharmacy*  Lab Work: Not needed  Testing/Procedures: Not needed  Follow-Up: At [facility], you and your health needs are our priority.  As part of our continuing mission to provide you with exceptional heart care, we have created designated Provider Care Teams.  These Care Teams include your primary Cardiologist (physician) and Advanced Practice Providers (APPs -  Physician Assistants and Nurse Practitioners) who all work together to provide you with the care you need, when you need it.  Your next appointment:   2 month(s)  The format for your next appointment:   In Person  Provider:   Cherlynn Kaiser, MD  Other Instructions n/a

## 2019-12-07 ENCOUNTER — Other Ambulatory Visit (INDEPENDENT_AMBULATORY_CARE_PROVIDER_SITE_OTHER): Payer: 59

## 2019-12-07 ENCOUNTER — Other Ambulatory Visit: Payer: Self-pay

## 2019-12-07 DIAGNOSIS — E0590 Thyrotoxicosis, unspecified without thyrotoxic crisis or storm: Secondary | ICD-10-CM

## 2019-12-07 LAB — COMPREHENSIVE METABOLIC PANEL
ALT: 32 U/L (ref 0–35)
AST: 21 U/L (ref 0–37)
Albumin: 3.8 g/dL (ref 3.5–5.2)
Alkaline Phosphatase: 284 U/L — ABNORMAL HIGH (ref 39–117)
BUN: 17 mg/dL (ref 6–23)
CO2: 27 mEq/L (ref 19–32)
Calcium: 9.8 mg/dL (ref 8.4–10.5)
Chloride: 104 mEq/L (ref 96–112)
Creatinine, Ser: 0.63 mg/dL (ref 0.40–1.20)
GFR: 95.63 mL/min (ref >60.00)
Glucose, Bld: 76 mg/dL (ref 70–99)
Potassium: 4.3 mEq/L (ref 3.5–5.1)
Sodium: 136 mEq/L (ref 135–145)
Total Bilirubin: 0.5 mg/dL (ref 0.2–1.2)
Total Protein: 7.3 g/dL (ref 6.0–8.3)

## 2019-12-07 LAB — T3, FREE: T3, Free: 6.2 pg/mL — ABNORMAL HIGH (ref 2.3–4.2)

## 2019-12-07 LAB — T4, FREE: Free T4: 1.48 ng/dL (ref 0.60–1.60)

## 2019-12-12 ENCOUNTER — Other Ambulatory Visit: Payer: Self-pay

## 2019-12-12 ENCOUNTER — Encounter: Payer: Self-pay | Admitting: Endocrinology

## 2019-12-12 ENCOUNTER — Ambulatory Visit: Payer: 59 | Admitting: Endocrinology

## 2019-12-12 VITALS — BP 132/84 | HR 76 | Ht 64.0 in | Wt 158.8 lb

## 2019-12-12 DIAGNOSIS — R748 Abnormal levels of other serum enzymes: Secondary | ICD-10-CM | POA: Diagnosis not present

## 2019-12-12 DIAGNOSIS — E0500 Thyrotoxicosis with diffuse goiter without thyrotoxic crisis or storm: Secondary | ICD-10-CM

## 2019-12-12 NOTE — Patient Instructions (Addendum)
Stop Methimazole 1 week before both procedures  Metoprolol 1/2 daily    COMMON QUESTIONS ABOUT RADIOACTIVE IODINE TREATMENT   Why is radioactive iodine used?  Radioactive iodine is a very common option for treating an overactive thyroid. Normally the thyroid gland uses iodine to make thyroid hormone. An overactive thyroid gland extracts the iodine more completely from the bloodstream. When radioactive iodine is given orally most of it is retained within the overactive thyroid.  The concentrated radioactivity slowly destroys the thyroid cells and controls the over activity.  Because the radioactive rays travel only a very short distance other organs are not affected.  Thus the radioactive iodine works in a targeted manner effectively and safely.   How is radioactive iodine given?  It is usually given as a capsule in a single dose.  First, a very small test dose of radioactive iodine is given and the amount retained in the thyroid is measured the next day with a special counter.  This helps [place] calculate the dose of radioactive iodine to be given for treatment.  The radioactive iodine is given under the supervision of a Radiologist with the proper precautions.  Since the effective dose of the radioactive iodine is approximate, rarely one may need a second dose to control the over activity.Thus the treatment is extremely simple to take.  What happens after the radioactive iodine is given?  There is a gradual reduction in the over activity of the thyroid.  Initially it takes time to get rid of the excess thyroid hormone already present in the body.  With the slow destruction of the thyroid cells the high levels start coming down after 2 to 3 weeks.  It may take up to 8 weeks to completely control the thyroid.  Usually most of the thyroid cells are destroyed and thyroid levels start getting low by two months.  However this will vary from patient to patient and the thyroid levels may remain normal for  some time.  It is very important to have regular follow-up after the treatment.  Once the thyroid levels get low you will be started on a thyroid supplement, taken once daily for life.    Are there any side effects of radioactive iodine?  Generally no side effects are encountered.  Rarely one may have discomfort and swelling in the thyroid gland for a few days.  This should be reported if there is significant pain.  The radiation exposure to internal organs from radioactive iodine is no more than in a kidney x-ray or barium studies.  There are no effects on reproductive organs but women who are pregnant or nursing should not take radioactive iodine.  No long-term effects including cancer have been seen.  Women can have children after treatment with radioactive iodine although it is recommended to avoid pregnancy for about six months.

## 2019-12-12 NOTE — Progress Notes (Signed)
Patient ID: Andrea Castro, female   DOB: [birth year], 64 y.o.   MRN: [id number]                                                                                                              Reason for Appointment:  Hyperthyroidism, follow-up visit  Referring healthcare provider: Darla Lesches FNP   Chief complaint: Shakiness    History of Present Illness:   History at baseline: For the last few months patient has had weight loss of 43 pounds She thinks this was from changing her diet and generally exercising more However she also feels like she has had shakiness of her entire body and also a feeling of fast heart rate She is starting to get hot flashes and generally feeling hot more than usual recently However she does not think she has any fatigue Her arms may be a little weaker than usual and she is not able to do as much physical work with her arms She does not think she is feeling more tired  Thyroid levels were checked in 12/20 and were abnormal as below  Recent history:  Her methimazole was increased to 20 mg twice daily on her last visit in 1/21 She continues to feel better and she has less shakiness, heat intolerance, dyspnea and good control of palpitations Weight has leveled off She has taken her methimazole regularly and has not nausea or decreased appetite with this  Her thyroid levels show normalization of free T4 but free T3 still high at 6.2 although slightly better Thyrotropin receptor antibody is last over 30  Wt Readings from Last 3 Encounters:  12/12/19 158 lb 12.8 oz (72 kg)  11/29/19 157 lb 6.4 oz (71.4 kg)  11/18/19 157 lb 3.2 oz (71.3 kg)       Thyroid function tests as follows:     Lab Results  Component Value Date   FREET4 1.48 12/07/2019   FREET4 1.91 (H) 11/07/2019   T3FREE 6.2 (H) 12/07/2019   T3FREE 7.2 (H) 11/07/2019   TSH <0.005 (L) 10/11/2019   TSH <0.005 (L) 09/28/2019   Free thyroxine index >12.1 with total T4 21.6 as of  10/11/2019  Lab Results  Component Value Date   THYROTRECAB 36.20 (H) 11/07/2019   THYROTRECAB 30.30 (H) 10/17/2019     Allergies as of 12/12/2019   No Known Allergies     Medication List       Accurate as of December 12, 2019  4:21 PM. If you have any questions, ask your nurse or doctor.        aspirin EC 81 MG tablet Take 1 tablet (81 mg total) by mouth daily.   Biotin 2500 MCG Caps Take 5,000 mcg by mouth daily.   calcium-vitamin D 500-200 MG-UNIT tablet Commonly known as: OSCAL WITH D Take 1 tablet by mouth 2 (two) times daily.   ferrous sulfate 325 (65 FE) MG tablet Take 325 mg by mouth daily with breakfast.   fish oil-omega-3 fatty acids 1000 MG capsule Take 1 g  by mouth 2 (two) times daily.   Glucosamine HCl 1000 MG Tabs Take 1,000 mg by mouth 2 (two) times daily.   ibuprofen 800 MG tablet Commonly known as: ADVIL Take 1 tablet (800 mg total) by mouth every 8 (eight) hours as needed.   loratadine 10 MG tablet Commonly known as: CLARITIN Take 10 mg by mouth daily as needed for allergies.   methimazole 10 MG tablet Commonly known as: TAPAZOLE Take 2 tablets by mouth twice daily.   metoprolol succinate 50 MG 24 hr tablet Commonly known as: Toprol XL Take 1 tablet (50 mg total) by mouth daily. Take with or immediately following a meal.   MULTIVITAMIN PO Take 1 tablet by mouth daily.   PROBIOTIC DAILY PO Take 1 capsule by mouth daily.   zinc gluconate 50 MG tablet Take 50 mg by mouth daily.           Past Medical History:  Diagnosis Date  . ACL (anterior cruciate ligament) tear 2002  . Arthritis   . Atypical nevus 05/27/1996   Right Rim Ear-Slight  . Cancer [facility]) 2011   left breast  . Complication of anesthesia   . History of breast cancer   . Hyphema of right eye   . PONV (postoperative nausea and vomiting)   . Ruptured disc, thoracic 08/1983    Past Surgical History:  Procedure Laterality Date  . BACK SURGERY  1984   ruptured  disc  . CYSTOCELE REPAIR N/A 09/01/2019   Procedure: ANTERIOR REPAIR (CYSTOCELE);  Surgeon: Newton Pigg, MD;  Location: [facility];  Service: Gynecology;  Laterality: N/A;  . HERNIA REPAIR  02/20/11   ventral hernia  . KNEE ARTHROSCOPY Left 08/17/2015   Procedure: Left Knee Arthroscopy and Debridement;  Surgeon: Newt Minion, MD;  Location: [place];  Service: Orthopedics;  Laterality: Left;  . KNEE ARTHROSCOPY WITH ANTERIOR CRUCIATE LIGAMENT (ACL) REPAIR Left 03/2010  . MASTECTOMY  10/30/10   right, total  . MASTECTOMY  05/22/10   left cancer/chemo and radiation  . nipple construction  08/2011   tattoo at another date  . PORT-A-CATH REMOVAL  01/09/2012   Procedure: MINOR REMOVAL PORT-A-CATH;  Surgeon: Haywood Lasso, MD;  Location: [place];  Service: General;  Laterality: N/A;  . PORTACATH PLACEMENT  12/17/09  . PUBOVAGINAL SLING N/A 09/01/2019   Procedure: Gaynelle Arabian;  Surgeon: Bjorn Loser, MD;  Location: [facility];  Service: Urology;  Laterality: N/A;  . RECONSTRUCTION BREAST W/ TRAM FLAP  02/20/11   bilateral  . RECTOCELE REPAIR N/A 09/01/2019   Procedure: POSSIBLE POSTERIOR REPAIR (RECTOCELE);  Surgeon: Newton Pigg, MD;  Location: [facility];  Service: Gynecology;  Laterality: N/A;  possible  . torn ligament  2002   left knee  . TUBAL LIGATION  2004  . VAGINAL HYSTERECTOMY N/A 09/01/2019   Procedure: HYSTERECTOMY VAGINAL;  Surgeon: Newton Pigg, MD;  Location: [facility];  Service: Gynecology;  Laterality: N/A;    Family History  Problem Relation Age of Onset  . COPD Father   . Hypertension Father   . Hypertension Mother   . Parkinson's disease Mother     Social History:  reports that she has never smoked. She has never used smokeless tobacco. She reports that she does not drink alcohol or use drugs.  Allergies: No Known Allergies   Review of Systems  She has persistent  increase in alkaline phosphatase, other liver enzymes are normal  Lab Results  Component Value Date   ALKPHOS 284 (H) 12/07/2019   ALKPHOS 84 08/11/2017      Examination:   BP 132/84 (BP Location: Left Arm, Patient Position: Sitting, Cuff Size: Normal)   Pulse 76   Ht 5\' 4"  (1.626 m)   Wt 158 lb 12.8 oz (72 kg)   SpO2 94%   BMI 27.26 kg/m   She looks well  She has mild prominence of the eyes with slight stare  Thyroid enlargement on the right side is 2-1/2 times normal, relatively smooth  Left lobe is about 2-1/2 times normal, relatively firm and somewhat nodular in the medial part and isthmus area           No tremor present Biceps reflexes are brisk   Assessment/Plan:   Hyperthyroidism, from Graves' disease   She has had significant symptoms along with a goiter and high thyrotropin receptor antibody level at baseline  Her symptoms are improving with further increasing the methimazole up to 20 mg twice daily Also it is better controlled now although still on metoprolol Objectively she looks euthyroid today except for mild stare Thyroid is still significantly enlarged  Again discussed the likelihood of methimazole not getting her into remission and the need for I-131 treatment which she agrees to She again wanted to know details about this procedure and this was discussed  Discussed that she would need to stop methimazole for a week prior to both the uptake and treatment appointments Also will need to do a thyroid scan to evaluate her wrist most on left lobe which appears firmer than the rest of the gland She will try to do the uptake and the treatment as and when she can schedule and have time away from work  She will continue her methimazole with 20 mg twice daily Continue metoprolol but reduce the dose to half tablet  ABNORMAL alkaline phosphatase: This may be related to hyperthyroidism causing increased bone turnover or abnormal liver source Level is slightly  higher and will continue to follow Will forward results to PCP also  Follow-up in 6 weeks   Elayne Snare 12/12/2019, 4:21 PM    Note: This office note was prepared with Dragon voice recognition system technology. Any transcriptional errors that result from this process are unintentional.

## 2019-12-13 ENCOUNTER — Other Ambulatory Visit: Payer: 59

## 2019-12-16 ENCOUNTER — Ambulatory Visit: Payer: 59 | Admitting: Endocrinology

## 2019-12-21 ENCOUNTER — Other Ambulatory Visit: Payer: Self-pay | Admitting: Endocrinology

## 2019-12-21 MED FILL — METOPROLOL SUCCINATE ER 50: 50 | 30 days supply | Qty: 30 | Fill #0

## 2019-12-26 ENCOUNTER — Other Ambulatory Visit: Payer: Self-pay

## 2019-12-26 ENCOUNTER — Encounter (HOSPITAL_COMMUNITY)
Admission: RE | Admit: 2019-12-26 | Discharge: 2019-12-26 | Disposition: A | Discharge status: Home / Self Care | Payer: 59 | Source: Ambulatory Visit | Attending: Endocrinology | Admitting: Endocrinology

## 2019-12-26 DIAGNOSIS — E0500 Thyrotoxicosis with diffuse goiter without thyrotoxic crisis or storm: Secondary | ICD-10-CM | POA: Diagnosis not present

## 2019-12-26 MED ORDER — SODIUM IODIDE I-123 7.4 MBQ CAPS
436.4000 | ORAL_CAPSULE | Freq: Once | ORAL | Status: AC
  Administered 2019-12-26: 436.4 via ORAL

## 2019-12-27 ENCOUNTER — Encounter (HOSPITAL_COMMUNITY)
Admission: RE | Admit: 2019-12-27 | Discharge: 2019-12-27 | Disposition: A | Discharge status: Home / Self Care | Payer: 59 | Source: Ambulatory Visit | Attending: Endocrinology | Admitting: Endocrinology

## 2019-12-27 DIAGNOSIS — E010 Iodine-deficiency related diffuse (endemic) goiter: Secondary | ICD-10-CM | POA: Diagnosis not present

## 2019-12-29 ENCOUNTER — Other Ambulatory Visit: Payer: Self-pay | Admitting: Endocrinology

## 2019-12-29 DIAGNOSIS — E0500 Thyrotoxicosis with diffuse goiter without thyrotoxic crisis or storm: Secondary | ICD-10-CM

## 2020-01-03 ENCOUNTER — Encounter: Payer: Self-pay | Admitting: *Deleted

## 2020-01-13 ENCOUNTER — Telehealth: Payer: Self-pay

## 2020-01-13 NOTE — Telephone Encounter (Signed)
Patient notified of appointment 02-03-20 at 1 pm

## 2020-01-13 NOTE — Telephone Encounter (Signed)
Left message on voicemail for patient to call back for appointment info.   February 03 2020 at 100 pm at [facility].

## 2020-01-15 ENCOUNTER — Other Ambulatory Visit (HOSPITAL_COMMUNITY): Payer: Self-pay | Admitting: Endocrinology

## 2020-01-15 DIAGNOSIS — E0590 Thyrotoxicosis, unspecified without thyrotoxic crisis or storm: Secondary | ICD-10-CM

## 2020-01-19 ENCOUNTER — Other Ambulatory Visit: Payer: 59

## 2020-01-20 ENCOUNTER — Other Ambulatory Visit: Payer: 59

## 2020-01-23 ENCOUNTER — Other Ambulatory Visit (INDEPENDENT_AMBULATORY_CARE_PROVIDER_SITE_OTHER): Payer: 59

## 2020-01-23 ENCOUNTER — Other Ambulatory Visit: Payer: Self-pay

## 2020-01-23 DIAGNOSIS — E0500 Thyrotoxicosis with diffuse goiter without thyrotoxic crisis or storm: Secondary | ICD-10-CM | POA: Diagnosis not present

## 2020-01-23 DIAGNOSIS — R748 Abnormal levels of other serum enzymes: Secondary | ICD-10-CM

## 2020-01-23 LAB — CBC WITH DIFFERENTIAL/PLATELET
Basophils Absolute: 0 10*3/uL (ref 0.0–0.1)
Basophils Relative: 0.5 % (ref 0.0–3.0)
Eosinophils Absolute: 0.1 10*3/uL (ref 0.0–0.7)
Eosinophils Relative: 1.4 % (ref 0.0–5.0)
HCT: 39.3 % (ref 36.0–46.0)
Hemoglobin: 13.1 g/dL (ref 12.0–15.0)
Lymphocytes Relative: 35.5 % (ref 12.0–46.0)
Lymphs Abs: 1.6 10*3/uL (ref 0.7–4.0)
MCHC: 33.4 g/dL (ref 30.0–36.0)
MCV: 86.1 fl (ref 78.0–100.0)
Monocytes Absolute: 0.5 10*3/uL (ref 0.1–1.0)
Monocytes Relative: 10.7 % (ref 3.0–12.0)
Neutro Abs: 2.4 10*3/uL (ref 1.4–7.7)
Neutrophils Relative %: 51.9 % (ref 43.0–77.0)
Platelets: 174 10*3/uL (ref 150.0–400.0)
RBC: 4.57 Mil/uL (ref 3.87–5.11)
RDW: 14 % (ref 11.5–15.5)
WBC: 4.6 10*3/uL (ref 4.0–10.5)

## 2020-01-23 LAB — HEPATIC FUNCTION PANEL
ALT: 42 U/L — ABNORMAL HIGH (ref 0–35)
AST: 21 U/L (ref 0–37)
Albumin: 3.7 g/dL (ref 3.5–5.2)
Alkaline Phosphatase: 182 U/L — ABNORMAL HIGH (ref 39–117)
Bilirubin, Direct: 0.1 mg/dL (ref 0.0–0.3)
Total Bilirubin: 0.6 mg/dL (ref 0.2–1.2)
Total Protein: 6.9 g/dL (ref 6.0–8.3)

## 2020-01-23 LAB — T3, FREE: T3, Free: 18.3 pg/mL — ABNORMAL HIGH (ref 2.3–4.2)

## 2020-01-23 LAB — T4, FREE: Free T4: 4.25 ng/dL — ABNORMAL HIGH (ref 0.60–1.60)

## 2020-01-23 MED FILL — methIMAzole 10 MG TABS: 10 | 30 days supply | Qty: 120 | Fill #1

## 2020-01-25 ENCOUNTER — Encounter: Payer: Self-pay | Admitting: Endocrinology

## 2020-01-25 ENCOUNTER — Ambulatory Visit: Payer: 59 | Admitting: Endocrinology

## 2020-01-25 ENCOUNTER — Other Ambulatory Visit: Payer: Self-pay

## 2020-01-25 VITALS — BP 142/76 | HR 97 | Ht 64.0 in | Wt 158.0 lb

## 2020-01-25 DIAGNOSIS — E0590 Thyrotoxicosis, unspecified without thyrotoxic crisis or storm: Secondary | ICD-10-CM

## 2020-01-25 NOTE — Patient Instructions (Signed)
Resume Rx 3 days after I 131 dose for 2 weeks  Then Rx 1 tab 2x daily  25 Toprol daily

## 2020-01-25 NOTE — Progress Notes (Signed)
Patient ID: Andrea Castro, female   DOB: 06/07/1956, 64 y.o.   MRN: [id number]                                                                                                              Reason for Appointment:  Hyperthyroidism, follow-up visit  Referring healthcare provider: Darla Lesches FNP   Chief complaint: Shakiness    History of Present Illness:   History at baseline: For the last few months patient has had weight loss of 43 pounds She thinks this was from changing her diet and generally exercising more However she also feels like she has had shakiness of her entire body and also a feeling of fast heart rate She is starting to get hot flashes and generally feeling hot more than usual recently However she does not think she has any fatigue Her arms may be a little weaker than usual and she is not able to do as much physical work with her arms She does not think she is feeling more tired  Thyroid levels were checked in 12/20 and were abnormal as below  Recent history:  Her methimazole was increased to 20 mg twice daily on her visit in 1/21  She is now scheduled for I-131 treatment later this morning However her uptake was done about 4 weeks ago and she still has not been able to get her treatment while being off methimazole With stopping methimazole a week prior to her I-131 uptake she is starting to get recurrence of her shakiness, feeling hot and a little weaker No weight loss She was told to start back on her methimazole 2 days ago when her thyroid levels came out significantly higher Has taken metoprolol but not clear if she is taking 12.5 or 25 mg She said her pulse rate at work today was just over 100  Thyrotropin receptor antibody is last over 30  Wt Readings from Last 3 Encounters:  01/25/20 158 lb (71.7 kg)  12/12/19 158 lb 12.8 oz (72 kg)  11/29/19 157 lb 6.4 oz (71.4 kg)      Thyroid function tests as follows:     Lab Results  Component Value Date   FREET4 4.25 (H) 01/23/2020   FREET4 1.48 12/07/2019   FREET4 1.91 (H) 11/07/2019   T3FREE 18.3 (H) 01/23/2020   T3FREE 6.2 (H) 12/07/2019   T3FREE 7.2 (H) 11/07/2019   TSH <0.005 (L) 10/11/2019   TSH <0.005 (L) 09/28/2019   Free thyroxine index >12.1 with total T4 21.6 as of 10/11/2019  Lab Results  Component Value Date   THYROTRECAB 36.20 (H) 11/07/2019   THYROTRECAB 30.30 (H) 10/17/2019     Allergies as of 01/25/2020   No Known Allergies     Medication List       Accurate as of January 25, 2020 11:59 PM. If you have any questions, ask your nurse or doctor.        aspirin EC 81 MG tablet Take 1 tablet (81 mg total) by mouth daily.   Biotin  2500 MCG Caps Take 5,000 mcg by mouth daily.   calcium-vitamin D 500-200 MG-UNIT tablet Commonly known as: OSCAL WITH D Take 1 tablet by mouth 2 (two) times daily.   ferrous sulfate 325 (65 FE) MG tablet Take 325 mg by mouth daily with breakfast.   fish oil-omega-3 fatty acids 1000 MG capsule Take 1 g by mouth 2 (two) times daily.   Glucosamine HCl 1000 MG Tabs Take 1,000 mg by mouth 2 (two) times daily.   ibuprofen 800 MG tablet Commonly known as: ADVIL Take 1 tablet (800 mg total) by mouth every 8 (eight) hours as needed.   loratadine 10 MG tablet Commonly known as: CLARITIN Take 10 mg by mouth daily as needed for allergies.   methimazole 10 MG tablet Commonly known as: TAPAZOLE Take 10 mg by mouth in the morning and at bedtime. Take 1 tablet by mouth twice daily. What changed: Another medication with the same name was removed. Continue taking this medication, and follow the directions you see here. Changed by: Elayne Snare, MD   metoprolol succinate 25 MG 24 hr tablet Commonly known as: TOPROL-XL Take 25 mg by mouth daily. What changed: Another medication with the same name was removed. Continue taking this medication, and follow the directions you see here. Changed by: Elayne Snare, MD   MULTIVITAMIN PO Take 1 tablet  by mouth daily.   PROBIOTIC DAILY PO Take 1 capsule by mouth daily.   zinc gluconate 50 MG tablet Take 50 mg by mouth daily.           Past Medical History:  Diagnosis Date  . ACL (anterior cruciate ligament) tear 2002  . Arthritis   . Atypical nevus 05/27/1996   Right Rim Ear-Slight  . Cancer [facility]) 2011   left breast  . Complication of anesthesia   . History of breast cancer   . Hyphema of right eye   . PONV (postoperative nausea and vomiting)   . Ruptured disc, thoracic 08/1983    Past Surgical History:  Procedure Laterality Date  . BACK SURGERY  1984   ruptured disc  . CYSTOCELE REPAIR N/A 09/01/2019   Procedure: ANTERIOR REPAIR (CYSTOCELE);  Surgeon: Newton Pigg, MD;  Location: [facility];  Service: Gynecology;  Laterality: N/A;  . HERNIA REPAIR  02/20/11   ventral hernia  . KNEE ARTHROSCOPY Left 08/17/2015   Procedure: Left Knee Arthroscopy and Debridement;  Surgeon: Newt Minion, MD;  Location: [place];  Service: Orthopedics;  Laterality: Left;  . KNEE ARTHROSCOPY WITH ANTERIOR CRUCIATE LIGAMENT (ACL) REPAIR Left 03/2010  . MASTECTOMY  10/30/10   right, total  . MASTECTOMY  05/22/10   left cancer/chemo and radiation  . nipple construction  08/2011   tattoo at another date  . PORT-A-CATH REMOVAL  01/09/2012   Procedure: MINOR REMOVAL PORT-A-CATH;  Surgeon: Haywood Lasso, MD;  Location: [place];  Service: General;  Laterality: N/A;  . PORTACATH PLACEMENT  12/17/09  . PUBOVAGINAL SLING N/A 09/01/2019   Procedure: Gaynelle Arabian;  Surgeon: Bjorn Loser, MD;  Location: [facility];  Service: Urology;  Laterality: N/A;  . RECONSTRUCTION BREAST W/ TRAM FLAP  02/20/11   bilateral  . RECTOCELE REPAIR N/A 09/01/2019   Procedure: POSSIBLE POSTERIOR REPAIR (RECTOCELE);  Surgeon: Newton Pigg, MD;  Location: [facility];  Service: Gynecology;  Laterality: N/A;  possible  . torn ligament  2002   left  knee  . TUBAL LIGATION  2004  .  VAGINAL HYSTERECTOMY N/A 09/01/2019   Procedure: HYSTERECTOMY VAGINAL;  Surgeon: Newton Pigg, MD;  Location: [facility];  Service: Gynecology;  Laterality: N/A;    Family History  Problem Relation Age of Onset  . COPD Father   . Hypertension Father   . Hypertension Mother   . Parkinson's disease Mother     Social History:  reports that she has never smoked. She has never used smokeless tobacco. She reports that she does not drink alcohol or use drugs.  Allergies: No Known Allergies   Review of Systems  She has persistent increase in alkaline phosphatase, slightly better on this visit Other liver enzymes are normal  Lab Results  Component Value Date   ALKPHOS 182 (H) 01/23/2020   ALKPHOS 84 08/11/2017      Examination:   BP (!) 142/76 (BP Location: Left Arm, Patient Position: Sitting, Cuff Size: Normal)   Pulse 97   Ht 5\' 4"  (1.626 m)   Wt 158 lb (71.7 kg)   SpO2 97%   BMI 27.12 kg/m   She looks well  Thyroid enlargement is bilaterally 2-2-1/2 times normal, slightly firm           Moderate tremor present Hands are not unusually warm   Assessment/Plan:   Hyperthyroidism, from Graves' disease   She has had significant symptoms along with a goiter and high thyrotropin receptor antibody level at baseline  Her symptoms had improved significantly with methimazole 20 mg twice a day but now she has recurrence of symptoms while being off methimazole in preparation for the I-131 treatment  Thyroid is still enlarged  Thyroid levels are very significantly higher without any methimazole  She will continue her methimazole with 20 mg twice daily until a week before her I-131 treatment She will then resume the treatment 3 days after the radioactive iodine dose for 2 weeks and then reduce to 1 tablet twice daily until she is seen Metoprolol 2025-50 mg daily  ABNORMAL alkaline phosphatase: This may be related to  hyperthyroidism Since the level is improved significantly from last visit likely this should resolve with control of her hyperthyroidism  Follow-up in 6 weeks   Elayne Snare 01/26/2020, 8:15 AM    Note: This office note was prepared with Dragon voice recognition system technology. Any transcriptional errors that result from this process are unintentional.

## 2020-01-26 ENCOUNTER — Telehealth: Payer: Self-pay | Admitting: Endocrinology

## 2020-01-26 ENCOUNTER — Other Ambulatory Visit: Payer: Self-pay

## 2020-01-26 MED ORDER — METOPROLOL SUCCINATE ER 25 MG PO TB24: 25.0000 mg | ORAL_TABLET | Freq: Every day | ORAL | 2 refills | Status: DC

## 2020-01-26 MED FILL — METOPROLOL SUCCINATE ER 25: 25 | 90 days supply | Qty: 90 | Fill #0

## 2020-01-26 NOTE — Telephone Encounter (Signed)
Patient called re: Patient states that at her last visit Dr. Dwyane Dee told her that he had prescribed the 25 mg of metoprolol succinate (TOPROL-XL) 25 MG 24 hr tablet-however patient states she never received the 25 mg RX. Patient states she is breaking her 50 mg tablets in half. Patient requests RX with updated dosage for the medication listed above be sent to:  [place], [place] - [address] [facility]. Phone:  [phone]  Fax:  [phone]

## 2020-01-26 NOTE — Telephone Encounter (Signed)
Rx sent 

## 2020-01-30 ENCOUNTER — Ambulatory Visit: Payer: 59 | Admitting: Internal Medicine

## 2020-02-03 ENCOUNTER — Ambulatory Visit (HOSPITAL_COMMUNITY)
Admission: RE | Admit: 2020-02-03 | Discharge: 2020-02-03 | Disposition: A | Discharge status: Home / Self Care | Payer: 59 | Source: Ambulatory Visit | Attending: Endocrinology | Admitting: Endocrinology

## 2020-02-03 ENCOUNTER — Other Ambulatory Visit: Payer: Self-pay

## 2020-02-03 DIAGNOSIS — E0500 Thyrotoxicosis with diffuse goiter without thyrotoxic crisis or storm: Secondary | ICD-10-CM | POA: Diagnosis not present

## 2020-02-03 MED ORDER — SODIUM IODIDE I 131 CAPSULE
17.4000 | Freq: Once | INTRAVENOUS | Status: AC | PRN
  Administered 2020-02-03: 17.4 via ORAL

## 2020-02-15 ENCOUNTER — Ambulatory Visit: Payer: 59 | Admitting: Family Medicine

## 2020-02-28 ENCOUNTER — Other Ambulatory Visit (INDEPENDENT_AMBULATORY_CARE_PROVIDER_SITE_OTHER): Payer: 59

## 2020-02-28 ENCOUNTER — Other Ambulatory Visit: Payer: Self-pay

## 2020-02-28 DIAGNOSIS — E0590 Thyrotoxicosis, unspecified without thyrotoxic crisis or storm: Secondary | ICD-10-CM

## 2020-02-28 LAB — TSH: TSH: 0.01 u[IU]/mL — ABNORMAL LOW (ref 0.35–4.50)

## 2020-02-28 LAB — CBC
HCT: 41.9 % (ref 36.0–46.0)
Hemoglobin: 14.1 g/dL (ref 12.0–15.0)
MCHC: 33.6 g/dL (ref 30.0–36.0)
MCV: 85.4 fl (ref 78.0–100.0)
Platelets: 194 10*3/uL (ref 150.0–400.0)
RBC: 4.91 Mil/uL (ref 3.87–5.11)
RDW: 14.1 % (ref 11.5–15.5)
WBC: 5.1 10*3/uL (ref 4.0–10.5)

## 2020-02-28 LAB — T4, FREE: Free T4: 2.32 ng/dL — ABNORMAL HIGH (ref 0.60–1.60)

## 2020-03-02 ENCOUNTER — Encounter: Payer: Self-pay | Admitting: Endocrinology

## 2020-03-02 ENCOUNTER — Ambulatory Visit (INDEPENDENT_AMBULATORY_CARE_PROVIDER_SITE_OTHER): Payer: 59 | Admitting: Endocrinology

## 2020-03-02 ENCOUNTER — Other Ambulatory Visit: Payer: Self-pay

## 2020-03-02 VITALS — BP 142/92 | HR 75 | Ht 64.0 in | Wt 159.2 lb

## 2020-03-02 DIAGNOSIS — E0590 Thyrotoxicosis, unspecified without thyrotoxic crisis or storm: Secondary | ICD-10-CM | POA: Diagnosis not present

## 2020-03-02 NOTE — Patient Instructions (Signed)
Metoprolol taper off  Methimazole 10 mg for 2 weeks only

## 2020-03-02 NOTE — Progress Notes (Signed)
Patient ID: Andrea Castro, female   DOB: [birth year], 64 y.o.   MRN: [id number]                                                                                                              Reason for Appointment:  Hyperthyroidism, follow-up visit  Referring healthcare provider: Darla Lesches FNP   Chief complaint: Shakiness    History of Present Illness:   History at baseline: For the last few months patient has had weight loss of 43 pounds She thinks this was from changing her diet and generally exercising more However she also feels like she has had shakiness of her entire body and also a feeling of fast heart rate She is starting to get hot flashes and generally feeling hot more than usual recently However she does not think she has any fatigue Her arms may be a little weaker than usual and she is not able to do as much physical work with her arms She does not think she is feeling more tired  Thyroid levels were checked in 12/20 and were abnormal as below  Recent history:  She is now 3 weeks past her I-131 treatment which she had with 17 mCi Methimazole was restarted at 10 mg twice daily also after the treatment She did not have any neck discomfort after the I-131 treatment  She does feel better overall, does not feel as tired or weak She has had improvement in her shakiness which has occurred only a couple of days Weight is coming up slightly Also feels somewhat warm but not as much Palpitations and heart rate have been controlled with metoprolol which she is taking 25 mg  Although her free T4 is still high it is significantly better than pretreatment  Thyrotropin receptor antibody is last over 30  Wt Readings from Last 3 Encounters:  03/02/20 159 lb 3.2 oz (72.2 kg)  01/25/20 158 lb (71.7 kg)  12/12/19 158 lb 12.8 oz (72 kg)      Thyroid function tests as follows:     Lab Results  Component Value Date   FREET4 2.32 (H) 02/28/2020   FREET4 4.25 (H) 01/23/2020    FREET4 1.48 12/07/2019   T3FREE 18.3 (H) 01/23/2020   T3FREE 6.2 (H) 12/07/2019   T3FREE 7.2 (H) 11/07/2019   TSH 0.01 (L) 02/28/2020   TSH <0.005 (L) 10/11/2019   TSH <0.005 (L) 09/28/2019   Free thyroxine index >12.1 with total T4 21.6 as of 10/11/2019  Lab Results  Component Value Date   THYROTRECAB 36.20 (H) 11/07/2019   THYROTRECAB 30.30 (H) 10/17/2019     Allergies as of 03/02/2020   No Known Allergies     Medication List       Accurate as of Mar 02, 2020  1:34 PM. If you have any questions, ask your nurse or doctor.        aspirin EC 81 MG tablet Take 1 tablet (81 mg total) by mouth daily.   Biotin 2500 MCG Caps Take 5,000 mcg by  mouth daily.   calcium-vitamin D 500-200 MG-UNIT tablet Commonly known as: OSCAL WITH D Take 1 tablet by mouth 2 (two) times daily.   ferrous sulfate 325 (65 FE) MG tablet Take 325 mg by mouth daily with breakfast.   fish oil-omega-3 fatty acids 1000 MG capsule Take 1 g by mouth 2 (two) times daily.   Glucosamine HCl 1000 MG Tabs Take 1,000 mg by mouth 2 (two) times daily.   ibuprofen 800 MG tablet Commonly known as: ADVIL Take 1 tablet (800 mg total) by mouth every 8 (eight) hours as needed.   loratadine 10 MG tablet Commonly known as: CLARITIN Take 10 mg by mouth daily as needed for allergies.   methimazole 10 MG tablet Commonly known as: TAPAZOLE Take 10 mg by mouth in the morning and at bedtime. Take 1 tablet by mouth twice daily.   metoprolol succinate 25 MG 24 hr tablet Commonly known as: TOPROL-XL Take 1 tablet (25 mg total) by mouth daily.   MULTIVITAMIN PO Take 1 tablet by mouth daily.   PROBIOTIC DAILY PO Take 1 capsule by mouth daily.   zinc gluconate 50 MG tablet Take 50 mg by mouth daily.           Past Medical History:  Diagnosis Date  . ACL (anterior cruciate ligament) tear 2002  . Arthritis   . Atypical nevus 05/27/1996   Right Rim Ear-Slight  . Cancer [facility]) 2011   left breast  .  Complication of anesthesia   . History of breast cancer   . Hyphema of right eye   . PONV (postoperative nausea and vomiting)   . Ruptured disc, thoracic 08/1983    Past Surgical History:  Procedure Laterality Date  . BACK SURGERY  1984   ruptured disc  . CYSTOCELE REPAIR N/A 09/01/2019   Procedure: ANTERIOR REPAIR (CYSTOCELE);  Surgeon: Newton Pigg, MD;  Location: [facility];  Service: Gynecology;  Laterality: N/A;  . HERNIA REPAIR  02/20/11   ventral hernia  . KNEE ARTHROSCOPY Left 08/17/2015   Procedure: Left Knee Arthroscopy and Debridement;  Surgeon: Newt Minion, MD;  Location: [place];  Service: Orthopedics;  Laterality: Left;  . KNEE ARTHROSCOPY WITH ANTERIOR CRUCIATE LIGAMENT (ACL) REPAIR Left 03/2010  . MASTECTOMY  10/30/10   right, total  . MASTECTOMY  05/22/10   left cancer/chemo and radiation  . nipple construction  08/2011   tattoo at another date  . PORT-A-CATH REMOVAL  01/09/2012   Procedure: MINOR REMOVAL PORT-A-CATH;  Surgeon: Haywood Lasso, MD;  Location: [place];  Service: General;  Laterality: N/A;  . PORTACATH PLACEMENT  12/17/09  . PUBOVAGINAL SLING N/A 09/01/2019   Procedure: Gaynelle Arabian;  Surgeon: Bjorn Loser, MD;  Location: [facility];  Service: Urology;  Laterality: N/A;  . RECONSTRUCTION BREAST W/ TRAM FLAP  02/20/11   bilateral  . RECTOCELE REPAIR N/A 09/01/2019   Procedure: POSSIBLE POSTERIOR REPAIR (RECTOCELE);  Surgeon: Newton Pigg, MD;  Location: [facility];  Service: Gynecology;  Laterality: N/A;  possible  . torn ligament  2002   left knee  . TUBAL LIGATION  2004  . VAGINAL HYSTERECTOMY N/A 09/01/2019   Procedure: HYSTERECTOMY VAGINAL;  Surgeon: Newton Pigg, MD;  Location: [facility];  Service: Gynecology;  Laterality: N/A;    Family History  Problem Relation Age of Onset  . COPD Father   . Hypertension Father   . Hypertension Mother   .  Parkinson's  disease Mother     Social History:  reports that she has never smoked. She has never used smokeless tobacco. She reports that she does not drink alcohol or use drugs.  Allergies: No Known Allergies   Review of Systems  She has persistent increase in alkaline phosphatase, slightly better on this visit Other liver enzymes are normal  Lab Results  Component Value Date   ALKPHOS 182 (H) 01/23/2020   ALKPHOS 84 08/11/2017      Examination:   BP (!) 142/92 (BP Location: Right Arm, Patient Position: Sitting, Cuff Size: Normal)   Pulse 75   Ht 5\' 4"  (1.626 m)   Wt 159 lb 3.2 oz (72.2 kg)   SpO2 96%   BMI 27.33 kg/m   Mild prominence of the eyes  Thyroid enlargement is present mostly on the right and about 1-1/2-2 times normal and firm, just palpable on the left side  No tremor present  Biceps reflexes are normal   Assessment/Plan:   Hyperthyroidism, from Graves' disease   She had significant symptoms along with a goiter and high thyrotropin receptor antibody level at baseline  She has been able to get her I-131 treatment With this her thyroid enlargement appears to be significantly improved She is subjectively doing better and looks euthyroid on exam Still taking 10 mg methimazole which is a reduced dose Also pulse rate is controlled with metoprolol  She will now take 10 mg methimazole once a day and stop after 2 weeks Metoprolol can be taken every other day and then taper off To call if she has any unusual fatigue and anticipate that she will be mildly hypothyroid on her next visit  Follow-up in 5 weeks   Elayne Snare 03/02/2020, 1:34 PM    Note: This office note was prepared with Dragon voice recognition system technology. Any transcriptional errors that result from this process are unintentional.

## 2020-03-15 ENCOUNTER — Ambulatory Visit: Payer: 59 | Admitting: Family Medicine

## 2020-03-26 ENCOUNTER — Ambulatory Visit: Payer: 59 | Admitting: Family Medicine

## 2020-03-27 ENCOUNTER — Other Ambulatory Visit: Payer: Self-pay

## 2020-03-27 ENCOUNTER — Encounter: Payer: Self-pay | Admitting: Internal Medicine

## 2020-03-27 ENCOUNTER — Ambulatory Visit: Payer: 59 | Admitting: Internal Medicine

## 2020-03-27 VITALS — BP 148/82 | HR 76 | Ht 64.75 in | Wt 163.0 lb

## 2020-03-27 DIAGNOSIS — I10 Essential (primary) hypertension: Secondary | ICD-10-CM

## 2020-03-27 DIAGNOSIS — Z853 Personal history of malignant neoplasm of breast: Secondary | ICD-10-CM | POA: Diagnosis not present

## 2020-03-27 DIAGNOSIS — I517 Cardiomegaly: Secondary | ICD-10-CM | POA: Diagnosis not present

## 2020-03-27 MED ORDER — LISINOPRIL 5 MG PO TABS: 5.0000 mg | ORAL_TABLET | Freq: Every day | ORAL | 3 refills | Status: DC

## 2020-03-27 NOTE — Patient Instructions (Signed)
Medication Instructions:   Restart taking lisinopril 5 mg one tablet daily  No other changes  *If you need a refill on your cardiac medications before your next appointment, please call your pharmacy*   Lab Work: Not needed    Testing/Procedures: Not needed   Follow-Up: At [facility], you and your health needs are our priority.  As part of our continuing mission to provide you with exceptional heart care, we have created designated Provider Care Teams.  These Care Teams include your primary Cardiologist (physician) and Advanced Practice Providers (APPs -  Physician Assistants and Nurse Practitioners) who all work together to provide you with the care you need, when you need it.    Your next appointment:   3 month(s)  The format for your next appointment:   In Person  Provider:   Cherlynn Kaiser, MD

## 2020-04-03 ENCOUNTER — Other Ambulatory Visit: Payer: 59

## 2020-04-06 ENCOUNTER — Ambulatory Visit: Payer: 59 | Admitting: Endocrinology

## 2020-04-20 ENCOUNTER — Other Ambulatory Visit: Payer: 59

## 2020-04-25 ENCOUNTER — Other Ambulatory Visit (INDEPENDENT_AMBULATORY_CARE_PROVIDER_SITE_OTHER): Payer: 59

## 2020-04-25 ENCOUNTER — Other Ambulatory Visit: Payer: Self-pay

## 2020-04-25 DIAGNOSIS — E0590 Thyrotoxicosis, unspecified without thyrotoxic crisis or storm: Secondary | ICD-10-CM

## 2020-04-25 LAB — TSH: TSH: 0.03 u[IU]/mL — ABNORMAL LOW (ref 0.35–4.50)

## 2020-04-25 LAB — T4, FREE: Free T4: 0.93 ng/dL (ref 0.60–1.60)

## 2020-04-30 ENCOUNTER — Ambulatory Visit: Payer: 59 | Admitting: Endocrinology

## 2020-04-30 ENCOUNTER — Other Ambulatory Visit: Payer: Self-pay

## 2020-04-30 ENCOUNTER — Encounter: Payer: Self-pay | Admitting: Endocrinology

## 2020-04-30 VITALS — BP 130/72 | HR 68 | Ht 64.75 in | Wt 170.2 lb

## 2020-04-30 DIAGNOSIS — E0500 Thyrotoxicosis with diffuse goiter without thyrotoxic crisis or storm: Secondary | ICD-10-CM

## 2020-04-30 DIAGNOSIS — R748 Abnormal levels of other serum enzymes: Secondary | ICD-10-CM

## 2020-04-30 NOTE — Progress Notes (Signed)
Cardiology Office Note:    Date:  03/27/2020   ID:  Andrea Castro, DOB [birth year], MRN [id number]  PCP:  Claretta Fraise, MD  Cardiologist:  Elouise Munroe, MD  Electrophysiologist:  None   Referring MD: Baruch Gouty, FNP   Chief Complaint: follow up HTN  History of Present Illness:    Andrea Castro is a 64 y.o. female with a history of left breast upper outer quadrant and left axillary needle core biopsy on 11/30/2009, both positive for a clinical T3 N1-2, stage IIIA invasive ductal carcinoma, estrogen receptor negative, progesterone receptor negativestatus post chemotherapy and radiation, with a regimen including docetaxel, carboplatin and trastuzumab x 6 cycles from 12/28/2009 through 04/19/2010 with Neulasta support.  Trastuzumab was continued to complete one year (to 12/27/2010).  She is status post bilateral mastectomy, and completed oral anastrozole therapy. She also has a history of hyperthyroidism, and underwent ablative therapy with I-131 on 01/25/20.  Continues to demonstrate mildly elevated BP.  She is doing well overall and feels better. Heart rate and tremor controlled with metoprolol. No significant palpitations She denies chest pain, chest pressure, dyspnea at rest or with exertion, PND, orthopnea, or leg swelling. Denies cough, fever, chills. Denies nausea, vomiting. Denies syncope or presyncope. Denies dizziness or lightheadedness.    Past Medical History:  Diagnosis Date  . ACL (anterior cruciate ligament) tear 2002  . Arthritis   . Atypical nevus 05/27/1996   Right Rim Ear-Slight  . Cancer [facility]) 2011   left breast  . Complication of anesthesia   . History of breast cancer   . Hyphema of right eye   . PONV (postoperative nausea and vomiting)   . Ruptured disc, thoracic 08/1983    Past Surgical History:  Procedure Laterality Date  . BACK SURGERY  1984   ruptured disc  . CYSTOCELE REPAIR N/A 09/01/2019   Procedure: ANTERIOR REPAIR (CYSTOCELE);   Surgeon: Newton Pigg, MD;  Location: [facility];  Service: Gynecology;  Laterality: N/A;  . HERNIA REPAIR  02/20/11   ventral hernia  . KNEE ARTHROSCOPY Left 08/17/2015   Procedure: Left Knee Arthroscopy and Debridement;  Surgeon: Newt Minion, MD;  Location: [place];  Service: Orthopedics;  Laterality: Left;  . KNEE ARTHROSCOPY WITH ANTERIOR CRUCIATE LIGAMENT (ACL) REPAIR Left 03/2010  . MASTECTOMY  10/30/10   right, total  . MASTECTOMY  05/22/10   left cancer/chemo and radiation  . nipple construction  08/2011   tattoo at another date  . PORT-A-CATH REMOVAL  01/09/2012   Procedure: MINOR REMOVAL PORT-A-CATH;  Surgeon: Haywood Lasso, MD;  Location: [place];  Service: General;  Laterality: N/A;  . PORTACATH PLACEMENT  12/17/09  . PUBOVAGINAL SLING N/A 09/01/2019   Procedure: Gaynelle Arabian;  Surgeon: Bjorn Loser, MD;  Location: [facility];  Service: Urology;  Laterality: N/A;  . RECONSTRUCTION BREAST W/ TRAM FLAP  02/20/11   bilateral  . RECTOCELE REPAIR N/A 09/01/2019   Procedure: POSSIBLE POSTERIOR REPAIR (RECTOCELE);  Surgeon: Newton Pigg, MD;  Location: [facility];  Service: Gynecology;  Laterality: N/A;  possible  . torn ligament  2002   left knee  . TUBAL LIGATION  2004  . VAGINAL HYSTERECTOMY N/A 09/01/2019   Procedure: HYSTERECTOMY VAGINAL;  Surgeon: Newton Pigg, MD;  Location: [facility];  Service: Gynecology;  Laterality: N/A;    Current Medications: Current Meds  Medication Sig  . aspirin EC 81 MG tablet Take 1 tablet (  81 mg total) by mouth daily.  . Biotin 2500 MCG CAPS Take 5,000 mcg by mouth daily.   . calcium-vitamin D (OSCAL WITH D) 500-200 MG-UNIT per tablet Take 1 tablet by mouth 2 (two) times daily.   . ferrous sulfate 325 (65 FE) MG tablet Take 325 mg by mouth daily with breakfast.  . fish oil-omega-3 fatty acids 1000 MG capsule Take 1 g by mouth 2 (two) times daily.     . Glucosamine HCl 1000 MG TABS Take 1,000 mg by mouth 2 (two) times daily.   Marland Kitchen ibuprofen (ADVIL) 800 MG tablet Take 1 tablet (800 mg total) by mouth every 8 (eight) hours as needed.  . loratadine (CLARITIN) 10 MG tablet Take 10 mg by mouth daily as needed for allergies.  . Multiple Vitamin (MULTIVITAMIN PO) Take 1 tablet by mouth daily.   . Probiotic Product (PROBIOTIC DAILY PO) Take 1 capsule by mouth daily.  Marland Kitchen zinc gluconate 50 MG tablet Take 50 mg by mouth daily.     Allergies:   Patient has no known allergies.   Social History   Socioeconomic History  . Marital status: Married    Spouse name: Not on file  . Number of children: Not on file  . Years of education: Not on file  . Highest education level: Not on file  Occupational History  . Not on file  Tobacco Use  . Smoking status: Never Smoker  . Smokeless tobacco: Never Used  Vaping Use  . Vaping Use: Never used  Substance and Sexual Activity  . Alcohol use: No  . Drug use: No  . Sexual activity: Yes    Birth control/protection: Post-menopausal  Other Topics Concern  . Not on file  Social History Narrative  . Not on file   Social Determinants of Health   Financial Resource Strain:   . Difficulty of Paying Living Expenses:   Food Insecurity:   . Worried About Charity fundraiser in the Last Year:   . Arboriculturist in the Last Year:   Transportation Needs:   . Film/video editor (Medical):   Marland Kitchen Lack of Transportation (Non-Medical):   Physical Activity:   . Days of Exercise per Week:   . Minutes of Exercise per Session:   Stress:   . Feeling of Stress :   Social Connections:   . Frequency of Communication with Friends and Family:   . Frequency of Social Gatherings with Friends and Family:   . Attends Religious Services:   . Active Member of Clubs or Organizations:   . Attends Archivist Meetings:   Marland Kitchen Marital Status:      Family History: The patient's family history includes COPD in her  father; Hypertension in her father and mother; Parkinson's disease in her mother.  ROS:   Please see the history of present illness.    All other systems reviewed and are negative.  EKGs/Labs/Other Studies Reviewed:    The following studies were reviewed today:  EKG:  n/a  Recent Labs: 12/07/2019: BUN 17; Creatinine, Ser 0.63; Potassium 4.3; Sodium 136 01/23/2020: ALT 42 02/28/2020: Hemoglobin 14.1; Platelets 194.0 04/25/2020: TSH 0.03  Recent Lipid Panel    Component Value Date/Time   CHOL 143 09/28/2019 1540   TRIG 75 09/28/2019 1540   HDL 61 09/28/2019 1540   CHOLHDL 2.3 09/28/2019 1540   LDLCALC 67 09/28/2019 1540    Physical Exam:    VS:  BP (!) 148/82   Pulse 76  Ht 5' 4.75" (1.645 m)   Wt 163 lb (73.9 kg)   SpO2 96%   BMI 27.33 kg/m     Wt Readings from Last 5 Encounters:  03/27/20 163 lb (73.9 kg)  03/02/20 159 lb 3.2 oz (72.2 kg)  01/25/20 158 lb (71.7 kg)  12/12/19 158 lb 12.8 oz (72 kg)  11/29/19 157 lb 6.4 oz (71.4 kg)     Constitutional: No acute distress Eyes: sclera non-icteric, normal conjunctiva and lids ENMT: normal dentition, moist mucous membranes Cardiovascular: regular rhythm, normal rate, no murmurs. S1 and S2 normal. Radial pulses normal bilaterally. No jugular venous distention.  Respiratory: clear to auscultation bilaterally GI : normal bowel sounds, soft and nontender. No distention.   MSK: extremities warm, well perfused. No edema.  NEURO: grossly nonfocal exam, moves all extremities. PSYCH: alert and oriented x 3, normal mood and affect.   ASSESSMENT:    1. Essential hypertension   2. LVH (left ventricular hypertrophy)   3. History of breast cancer    PLAN:    Essential hypertension LVH (left ventricular hypertrophy) - She continues to have mildly elevated BP and had mild-moderate LVH on echo - consider essential hypertension and will restart lisinopril and monitor for effect. If BP is too low on therapy can consider  observation. May be contributed to by endocrine disorder though she has undergone thyroid ablation and this should be resolving.  She continues to exercise and optimize her diet.   Total time of encounter: 30 minutes total time of encounter, including 20 minutes spent in face-to-face patient care on the date of this encounter. This time includes coordination of care and counseling regarding above mentioned problem list. Remainder of non-face-to-face time involved reviewing chart documents/testing relevant to the patient encounter and documentation in the medical record. I have independently reviewed documentation from referring provider.   Cherlynn Kaiser, MD [place]  [facility]    Medication Adjustments/Labs and Tests Ordered: Current medicines are reviewed at length with the patient today.  Concerns regarding medicines are outlined above.  No orders of the defined types were placed in this encounter.  Meds ordered this encounter  Medications  . lisinopril (ZESTRIL) 5 MG tablet    Sig: Take 1 tablet (5 mg total) by mouth daily.    Dispense:  90 tablet    Refill:  3    Restarting medication    Patient Instructions  Medication Instructions:   Restart taking lisinopril 5 mg one tablet daily  No other changes  *If you need a refill on your cardiac medications before your next appointment, please call your pharmacy*   Lab Work: Not needed    Testing/Procedures: Not needed   Follow-Up: At [facility], you and your health needs are our priority.  As part of our continuing mission to provide you with exceptional heart care, we have created designated Provider Care Teams.  These Care Teams include your primary Cardiologist (physician) and Advanced Practice Providers (APPs -  Physician Assistants and Nurse Practitioners) who all work together to provide you with the care you need, when you need it.    Your next appointment:   3 month(s)  The format for your next  appointment:   In Person  Provider:   Cherlynn Kaiser, MD

## 2020-04-30 NOTE — Progress Notes (Signed)
Patient ID: Andrea Castro, female   DOB: 1956/04/10, 64 y.o.   MRN: [id number]                                                                                                              Reason for Appointment:  Hyperthyroidism, follow-up visit  Referring healthcare provider: Darla Lesches FNP   Chief complaint: f/u   History of Present Illness:   History at baseline: For the last few months patient has had weight loss of 43 pounds She thinks this was from changing her diet and generally exercising more However she also feels like she has had shakiness of her entire body and also a feeling of fast heart rate She is starting to get hot flashes and generally feeling hot more than usual recently However she does not think she has any fatigue Her arms may be a little weaker than usual and she is not able to do as much physical work with her arms She does not think she is feeling more tired  Thyroid levels were checked in 12/20 and were abnormal as below  Recent history:  She had her I-131 treatment on 02/03/2020 with 17 mCi  Methimazole was restarted after the treatment but this was stopped after her last visit  She continues to feel better than before and feels fairly normal now Her weight has gone up 7 pounds however She does not think she feels cold or has any fatigue Not on metoprolol now  Now her free T4 is finally normal, previously consistently high TSH suppressed   Wt Readings from Last 3 Encounters:  04/30/20 170 lb 3.2 oz (77.2 kg)  03/27/20 163 lb (73.9 kg)  03/02/20 159 lb 3.2 oz (72.2 kg)      Thyroid function tests as follows:     Lab Results  Component Value Date   FREET4 0.93 04/25/2020   FREET4 2.32 (H) 02/28/2020   FREET4 4.25 (H) 01/23/2020   T3FREE 18.3 (H) 01/23/2020   T3FREE 6.2 (H) 12/07/2019   T3FREE 7.2 (H) 11/07/2019   TSH 0.03 (L) 04/25/2020   TSH 0.01 (L) 02/28/2020   TSH <0.005 (L) 10/11/2019   Free thyroxine index >12.1 with total  T4 21.6 as of 10/11/2019  Lab Results  Component Value Date   THYROTRECAB 36.20 (H) 11/07/2019   THYROTRECAB 30.30 (H) 10/17/2019     Allergies as of 04/30/2020   No Known Allergies     Medication List       Accurate as of April 30, 2020  4:51 PM. If you have any questions, ask your nurse or doctor.        aspirin EC 81 MG tablet Take 1 tablet (81 mg total) by mouth daily.   Biotin 2500 MCG Caps Take 5,000 mcg by mouth daily.   calcium-vitamin D 500-200 MG-UNIT tablet Commonly known as: OSCAL WITH D Take 1 tablet by mouth 2 (two) times daily.   ferrous sulfate 325 (65 FE) MG tablet Take 325 mg by mouth daily with breakfast.  fish oil-omega-3 fatty acids 1000 MG capsule Take 1 g by mouth 2 (two) times daily.   Glucosamine HCl 1000 MG Tabs Take 1,000 mg by mouth 2 (two) times daily.   ibuprofen 800 MG tablet Commonly known as: ADVIL Take 1 tablet (800 mg total) by mouth every 8 (eight) hours as needed.   lisinopril 5 MG tablet Commonly known as: ZESTRIL Take 1 tablet (5 mg total) by mouth daily.   loratadine 10 MG tablet Commonly known as: CLARITIN Take 10 mg by mouth daily as needed for allergies.   MULTIVITAMIN PO Take 1 tablet by mouth daily.   PROBIOTIC DAILY PO Take 1 capsule by mouth daily.   zinc gluconate 50 MG tablet Take 50 mg by mouth daily.           Past Medical History:  Diagnosis Date  . ACL (anterior cruciate ligament) tear 2002  . Arthritis   . Atypical nevus 05/27/1996   Right Rim Ear-Slight  . Cancer [facility]) 2011   left breast  . Complication of anesthesia   . History of breast cancer   . Hyphema of right eye   . PONV (postoperative nausea and vomiting)   . Ruptured disc, thoracic 08/1983    Past Surgical History:  Procedure Laterality Date  . BACK SURGERY  1984   ruptured disc  . CYSTOCELE REPAIR N/A 09/01/2019   Procedure: ANTERIOR REPAIR (CYSTOCELE);  Surgeon: Newton Pigg, MD;  Location: [facility];   Service: Gynecology;  Laterality: N/A;  . HERNIA REPAIR  02/20/11   ventral hernia  . KNEE ARTHROSCOPY Left 08/17/2015   Procedure: Left Knee Arthroscopy and Debridement;  Surgeon: Newt Minion, MD;  Location: [place];  Service: Orthopedics;  Laterality: Left;  . KNEE ARTHROSCOPY WITH ANTERIOR CRUCIATE LIGAMENT (ACL) REPAIR Left 03/2010  . MASTECTOMY  10/30/10   right, total  . MASTECTOMY  05/22/10   left cancer/chemo and radiation  . nipple construction  08/2011   tattoo at another date  . PORT-A-CATH REMOVAL  01/09/2012   Procedure: MINOR REMOVAL PORT-A-CATH;  Surgeon: Haywood Lasso, MD;  Location: [place];  Service: General;  Laterality: N/A;  . PORTACATH PLACEMENT  12/17/09  . PUBOVAGINAL SLING N/A 09/01/2019   Procedure: Gaynelle Arabian;  Surgeon: Bjorn Loser, MD;  Location: [facility];  Service: Urology;  Laterality: N/A;  . RECONSTRUCTION BREAST W/ TRAM FLAP  02/20/11   bilateral  . RECTOCELE REPAIR N/A 09/01/2019   Procedure: POSSIBLE POSTERIOR REPAIR (RECTOCELE);  Surgeon: Newton Pigg, MD;  Location: [facility];  Service: Gynecology;  Laterality: N/A;  possible  . torn ligament  2002   left knee  . TUBAL LIGATION  2004  . VAGINAL HYSTERECTOMY N/A 09/01/2019   Procedure: HYSTERECTOMY VAGINAL;  Surgeon: Newton Pigg, MD;  Location: [facility];  Service: Gynecology;  Laterality: N/A;    Family History  Problem Relation Age of Onset  . COPD Father   . Hypertension Father   . Hypertension Mother   . Parkinson's disease Mother     Social History:  reports that she has never smoked. She has never used smokeless tobacco. She reports that she does not drink alcohol and does not use drugs.  Allergies: No Known Allergies   Review of Systems  She has persistent increase in alkaline phosphatase, slightly better on this visit Other liver enzymes are normal  Lab Results  Component Value Date   ALKPHOS  182 (H)  01/23/2020   ALKPHOS 84 08/11/2017      Examination:   BP 130/72 (BP Location: Right Arm, Patient Position: Sitting, Cuff Size: Large)   Pulse 68   Ht 5' 4.75" (1.645 m)   Wt 170 lb 3.2 oz (77.2 kg)   SpO2 99%   BMI 28.54 kg/m   Mild prominence of the eyes  Thyroid is not palpable  Biceps reflexes are normal   Assessment/Plan:   Hyperthyroidism, from Graves' disease   She had significant symptoms along with a goiter and high thyrotropin receptor antibody level at baseline  She has subjectively done well after I-131 treatment nearly 3 months ago Thyroid enlargement has regressed  However currently not showing any signs of her thyroid levels getting lower although being maintained in the normal range without methimazole now  To call if she has any unusual fatigue and anticipate that she will be mildly hypothyroid by her next visit However will need to make sure she does not have a relapse of hyperthyroidism also  We will also follow-up on her liver functions on the next visit  Follow-up in 6 weeks   Shaena Parkerson 04/30/2020, 4:51 PM    Note: This office note was prepared with Dragon voice recognition system technology. Any transcriptional errors that result from this process are unintentional.

## 2020-06-06 ENCOUNTER — Other Ambulatory Visit: Payer: 59

## 2020-06-06 ENCOUNTER — Other Ambulatory Visit (INDEPENDENT_AMBULATORY_CARE_PROVIDER_SITE_OTHER): Payer: 59

## 2020-06-06 ENCOUNTER — Other Ambulatory Visit: Payer: Self-pay

## 2020-06-06 DIAGNOSIS — R748 Abnormal levels of other serum enzymes: Secondary | ICD-10-CM

## 2020-06-06 DIAGNOSIS — E0500 Thyrotoxicosis with diffuse goiter without thyrotoxic crisis or storm: Secondary | ICD-10-CM | POA: Diagnosis not present

## 2020-06-06 LAB — HEPATIC FUNCTION PANEL
ALT: 23 U/L (ref 0–35)
AST: 28 U/L (ref 0–37)
Albumin: 4.1 g/dL (ref 3.5–5.2)
Alkaline Phosphatase: 157 U/L — ABNORMAL HIGH (ref 39–117)
Bilirubin, Direct: 0.1 mg/dL (ref 0.0–0.3)
Total Bilirubin: 0.5 mg/dL (ref 0.2–1.2)
Total Protein: 7.7 g/dL (ref 6.0–8.3)

## 2020-06-06 LAB — TSH: TSH: 94.11 u[IU]/mL — ABNORMAL HIGH (ref 0.35–4.50)

## 2020-06-06 LAB — T4, FREE: Free T4: 0.6 ng/dL (ref 0.60–1.60)

## 2020-06-11 ENCOUNTER — Encounter: Payer: Self-pay | Admitting: Endocrinology

## 2020-06-11 ENCOUNTER — Other Ambulatory Visit: Payer: Self-pay | Admitting: Endocrinology

## 2020-06-11 ENCOUNTER — Other Ambulatory Visit: Payer: Self-pay

## 2020-06-11 ENCOUNTER — Ambulatory Visit: Payer: 59 | Admitting: Endocrinology

## 2020-06-11 VITALS — BP 130/84 | HR 84 | Ht 64.75 in | Wt 177.8 lb

## 2020-06-11 DIAGNOSIS — R748 Abnormal levels of other serum enzymes: Secondary | ICD-10-CM

## 2020-06-11 DIAGNOSIS — E038 Other specified hypothyroidism: Secondary | ICD-10-CM | POA: Diagnosis not present

## 2020-06-11 DIAGNOSIS — E890 Postprocedural hypothyroidism: Secondary | ICD-10-CM

## 2020-06-11 MED ORDER — LEVOTHYROXINE SODIUM 137 MCG PO TABS: 137.0000 ug | ORAL_TABLET | Freq: Every day | ORAL | 2 refills | Status: DC

## 2020-06-11 MED FILL — LEVOTHYROXINE 137 MCG TAB: 137 | 30 days supply | Qty: 30 | Fill #0

## 2020-06-11 NOTE — Progress Notes (Signed)
Patient ID: Andrea Castro, female   DOB: 1955/11/13, 64 y.o.   MRN: [id number]                                                                                                              Reason for Appointment: Follow-up of thyroid  Referring healthcare provider: Darla Lesches FNP   Chief complaint: Follow-up   History of Present Illness:   History at baseline: For the last few months patient has had weight loss of 43 pounds She thinks this was from changing her diet and generally exercising more However she also feels like she has had shakiness of her entire body and also a feeling of fast heart rate She is starting to get hot flashes and generally feeling hot more than usual recently However she does not think she has any fatigue Her arms may be a little weaker than usual and she is not able to do as much physical work with her arms She does not think she is feeling more tired  Thyroid levels were checked in 12/20 and were abnormal as below  Recent history:  She had I-131 treatment on 02/03/2020 with 17 mCi for Graves' disease  Methimazole was restarted after the treatment but this was stopped subsequently  Her thyroid levels had stayed normal even 3 months after her I-131 treatment However has gained another 7 pounds She does tend to get tired and evenings and will take a nap when she comes back from work; has noticed this for the last 7 to 10 days Also may be feeling a little colder in the evenings  Now her TSH is markedly increased at 94, previously suppressed Free T4 is low normal   Wt Readings from Last 3 Encounters:  06/11/20 177 lb 12.8 oz (80.6 kg)  04/30/20 170 lb 3.2 oz (77.2 kg)  03/27/20 163 lb (73.9 kg)      Thyroid function tests as follows:     Lab Results  Component Value Date   FREET4 0.60 06/06/2020   FREET4 0.93 04/25/2020   FREET4 2.32 (H) 02/28/2020   T3FREE 18.3 (H) 01/23/2020   T3FREE 6.2 (H) 12/07/2019   T3FREE 7.2 (H) 11/07/2019   TSH  94.11 (H) 06/06/2020   TSH 0.03 (L) 04/25/2020   TSH 0.01 (L) 02/28/2020   Free thyroxine index >12.1 with total T4 21.6 as of 10/11/2019  Lab Results  Component Value Date   THYROTRECAB 36.20 (H) 11/07/2019   THYROTRECAB 30.30 (H) 10/17/2019     Allergies as of 06/11/2020   No Known Allergies     Medication List       Accurate as of June 11, 2020  9:06 PM. If you have any questions, ask your nurse or doctor.        STOP taking these medications   PROBIOTIC DAILY PO Stopped by: Elayne Snare, MD     TAKE these medications   aspirin EC 81 MG tablet Take 1 tablet (81 mg total) by mouth daily.   Biotin 2500 MCG Caps  Take 5,000 mcg by mouth daily.   calcium-vitamin D 500-200 MG-UNIT tablet Commonly known as: OSCAL WITH D Take 1 tablet by mouth 2 (two) times daily.   ferrous sulfate 325 (65 FE) MG tablet Take 325 mg by mouth daily as needed.   fish oil-omega-3 fatty acids 1000 MG capsule Take 1 g by mouth 2 (two) times daily.   Glucosamine HCl 1000 MG Tabs Take 1,000 mg by mouth 2 (two) times daily.   ibuprofen 800 MG tablet Commonly known as: ADVIL Take 1 tablet (800 mg total) by mouth every 8 (eight) hours as needed.   levothyroxine 137 MCG tablet Commonly known as: Synthroid Take 1 tablet (137 mcg total) by mouth daily before breakfast. Started by: Elayne Snare, MD   lisinopril 5 MG tablet Commonly known as: ZESTRIL Take 1 tablet (5 mg total) by mouth daily.   loratadine 10 MG tablet Commonly known as: CLARITIN Take 10 mg by mouth daily as needed for allergies.   MULTIVITAMIN PO Take 1 tablet by mouth daily.   zinc gluconate 50 MG tablet Take 50 mg by mouth daily as needed.           Past Medical History:  Diagnosis Date  . ACL (anterior cruciate ligament) tear 2002  . Arthritis   . Atypical nevus 05/27/1996   Right Rim Ear-Slight  . Cancer [facility]) 2011   left breast  . Complication of anesthesia   . History of breast cancer   . Hyphema of  right eye   . PONV (postoperative nausea and vomiting)   . Ruptured disc, thoracic 08/1983    Past Surgical History:  Procedure Laterality Date  . BACK SURGERY  1984   ruptured disc  . CYSTOCELE REPAIR N/A 09/01/2019   Procedure: ANTERIOR REPAIR (CYSTOCELE);  Surgeon: Newton Pigg, MD;  Location: [facility];  Service: Gynecology;  Laterality: N/A;  . HERNIA REPAIR  02/20/11   ventral hernia  . KNEE ARTHROSCOPY Left 08/17/2015   Procedure: Left Knee Arthroscopy and Debridement;  Surgeon: Newt Minion, MD;  Location: [place];  Service: Orthopedics;  Laterality: Left;  . KNEE ARTHROSCOPY WITH ANTERIOR CRUCIATE LIGAMENT (ACL) REPAIR Left 03/2010  . MASTECTOMY  10/30/10   right, total  . MASTECTOMY  05/22/10   left cancer/chemo and radiation  . nipple construction  08/2011   tattoo at another date  . PORT-A-CATH REMOVAL  01/09/2012   Procedure: MINOR REMOVAL PORT-A-CATH;  Surgeon: Haywood Lasso, MD;  Location: [place];  Service: General;  Laterality: N/A;  . PORTACATH PLACEMENT  12/17/09  . PUBOVAGINAL SLING N/A 09/01/2019   Procedure: Gaynelle Arabian;  Surgeon: Bjorn Loser, MD;  Location: [facility];  Service: Urology;  Laterality: N/A;  . RECONSTRUCTION BREAST W/ TRAM FLAP  02/20/11   bilateral  . RECTOCELE REPAIR N/A 09/01/2019   Procedure: POSSIBLE POSTERIOR REPAIR (RECTOCELE);  Surgeon: Newton Pigg, MD;  Location: [facility];  Service: Gynecology;  Laterality: N/A;  possible  . torn ligament  2002   left knee  . TUBAL LIGATION  2004  . VAGINAL HYSTERECTOMY N/A 09/01/2019   Procedure: HYSTERECTOMY VAGINAL;  Surgeon: Newton Pigg, MD;  Location: [facility];  Service: Gynecology;  Laterality: N/A;    Family History  Problem Relation Age of Onset  . COPD Father   . Hypertension Father   . Hypertension Mother   . Parkinson's disease Mother     Social History:  reports that  she has  never smoked. She has never used smokeless tobacco. She reports that she does not drink alcohol and does not use drugs.  Allergies: No Known Allergies   Review of Systems  She has persistent increase in alkaline phosphatase Other liver enzymes are normal  Lab Results  Component Value Date   ALKPHOS 157 (H) 06/06/2020   ALKPHOS 84 08/11/2017      Examination:   BP 130/84 (BP Location: Left Arm, Patient Position: Sitting, Cuff Size: Normal)   Pulse 84   Ht 5' 4.75" (1.645 m)   Wt 177 lb 12.8 oz (80.6 kg)   SpO2 98%   BMI 29.82 kg/m    Thyroid is not palpable  Biceps and triceps reflexes appear normal   Assessment/Plan:  Post ablative hypothyroidism:  She has finally become hypothyroid about 4 months after her I-131 treatment She is only mildly symptomatic but her TSH is significantly high at 94  Discussed that she does need to be started on levothyroxine supplementation likely full doses since she is showing marked increase in TSH She will start with 137 mcg daily and given her general information on thyroid supplementation  She will take this in the morning on empty stomach and avoid any calcium or vitamin supplements at the same time  Increased alkaline phosphatase: This is gradually improving and may well have been from her severe hyperthyroidism, will continue to follow  Follow-up in 8 weeks   Elayne Snare 06/11/2020, 9:06 PM    Note: This office note was prepared with Dragon voice recognition system technology. Any transcriptional errors that result from this process are unintentional.

## 2020-06-11 NOTE — Progress Notes (Signed)
Andrea Castro

## 2020-07-02 ENCOUNTER — Ambulatory Visit (INDEPENDENT_AMBULATORY_CARE_PROVIDER_SITE_OTHER): Payer: 59 | Admitting: Internal Medicine

## 2020-07-02 ENCOUNTER — Encounter: Payer: Self-pay | Admitting: Internal Medicine

## 2020-07-02 ENCOUNTER — Other Ambulatory Visit: Payer: Self-pay

## 2020-07-02 VITALS — BP 148/98 | HR 67 | Ht 64.0 in | Wt 177.2 lb

## 2020-07-02 DIAGNOSIS — I517 Cardiomegaly: Secondary | ICD-10-CM

## 2020-07-02 DIAGNOSIS — Z853 Personal history of malignant neoplasm of breast: Secondary | ICD-10-CM | POA: Diagnosis not present

## 2020-07-02 DIAGNOSIS — I10 Essential (primary) hypertension: Secondary | ICD-10-CM | POA: Diagnosis not present

## 2020-07-02 NOTE — Patient Instructions (Signed)
Medication Instructions:  No Changes In Medications at this time.  *If you need a refill on your cardiac medications before your next appointment, please call your pharmacy*  Lab Work: None Ordered At This Time.  If you have labs (blood work) drawn today and your tests are completely normal, you will receive your results only by: . MyChart Message (if you have MyChart) OR . A paper copy in the mail If you have any lab test that is abnormal or we need to change your treatment, we will call you to review the results.  Testing/Procedures: None Ordered At This Time.   Follow-Up: At [facility], you and your health needs are our priority.  As part of our continuing mission to provide you with exceptional heart care, we have created designated Provider Care Teams.  These Care Teams include your primary Cardiologist (physician) and Advanced Practice Providers (APPs -  Physician Assistants and Nurse Practitioners) who all work together to provide you with the care you need, when you need it.  Your next appointment:   6 month(s)  The format for your next appointment:   In Person  Provider:   Gayatri Acharya, MD   

## 2020-07-02 NOTE — Progress Notes (Signed)
Cardiology Office Note:    Date:  07/02/2020   ID:  Andrea Castro, DOB 14-Feb-1956, MRN [id number]  PCP:  Claretta Fraise, MD  Cardiologist:  Elouise Munroe, MD  Electrophysiologist:  None   Referring MD: Claretta Fraise, MD   Chief Complaint/Reason for Referral: HTN  History of Present Illness:    Andrea Castro is a 64 y.o. female with a history of left breast upper outer quadrant and left axillary needle core biopsy on 11/30/2009, both positive for a clinical T3 N1-2, stage IIIA invasive ductal carcinoma, estrogen receptor negative, progesterone receptor negativestatus post chemotherapy and radiation, with a regimen including docetaxel, carboplatin and trastuzumab x 6 cycles from 12/28/2009 through 04/19/2010 with Neulasta support.  Trastuzumab was continued to complete one year (to 12/27/2010).  She is status post bilateral mastectomy, and completed oral anastrozole therapy. She also has a history of hyperthyroidism, and underwent ablative therapy with I-131 on 01/25/20.   I have been following her in the setting of hypertension initially felt to be primarily contributed to by hyperthyroid.  Since ablation she has had some laboratory abnormalities, but subsequently has documented hypothyroidism and has been placed on Synthroid.  She feels a little sluggishness and has had some weight gain, but overall is doing well.  She continues to walk with her sister by phone, and they have included their youngest sister who schedule is quite a bit busier but joins when she can.  Blood pressure is mildly elevated today because she has not yet taken her medications.  A refill is sitting at the pharmacy and she will get it after our visit.  Overall her blood pressure readings have been normal at home.  She denies chest pain or shortness of breath.  She denies palpitations.  Past Medical History:  Diagnosis Date  . ACL (anterior cruciate ligament) tear 2002  . Arthritis   . Atypical nevus 05/27/1996    Right Rim Ear-Slight  . Cancer [facility]) 2011   left breast  . Complication of anesthesia   . History of breast cancer   . Hyphema of right eye   . PONV (postoperative nausea and vomiting)   . Ruptured disc, thoracic 08/1983    Past Surgical History:  Procedure Laterality Date  . BACK SURGERY  1984   ruptured disc  . CYSTOCELE REPAIR N/A 09/01/2019   Procedure: ANTERIOR REPAIR (CYSTOCELE);  Surgeon: Newton Pigg, MD;  Location: [facility];  Service: Gynecology;  Laterality: N/A;  . HERNIA REPAIR  02/20/11   ventral hernia  . KNEE ARTHROSCOPY Left 08/17/2015   Procedure: Left Knee Arthroscopy and Debridement;  Surgeon: Newt Minion, MD;  Location: [place];  Service: Orthopedics;  Laterality: Left;  . KNEE ARTHROSCOPY WITH ANTERIOR CRUCIATE LIGAMENT (ACL) REPAIR Left 03/2010  . MASTECTOMY  10/30/10   right, total  . MASTECTOMY  05/22/10   left cancer/chemo and radiation  . nipple construction  08/2011   tattoo at another date  . PORT-A-CATH REMOVAL  01/09/2012   Procedure: MINOR REMOVAL PORT-A-CATH;  Surgeon: Haywood Lasso, MD;  Location: [place];  Service: General;  Laterality: N/A;  . PORTACATH PLACEMENT  12/17/09  . PUBOVAGINAL SLING N/A 09/01/2019   Procedure: Gaynelle Arabian;  Surgeon: Bjorn Loser, MD;  Location: [facility];  Service: Urology;  Laterality: N/A;  . RECONSTRUCTION BREAST W/ TRAM FLAP  02/20/11   bilateral  . RECTOCELE REPAIR N/A 09/01/2019   Procedure: POSSIBLE POSTERIOR REPAIR (RECTOCELE);  Surgeon:  Newton Pigg, MD;  Location: [facility];  Service: Gynecology;  Laterality: N/A;  possible  . torn ligament  2002   left knee  . TUBAL LIGATION  2004  . VAGINAL HYSTERECTOMY N/A 09/01/2019   Procedure: HYSTERECTOMY VAGINAL;  Surgeon: Newton Pigg, MD;  Location: [facility];  Service: Gynecology;  Laterality: N/A;    Current Medications: Current Meds  Medication Sig  .  aspirin EC 81 MG tablet Take 1 tablet (81 mg total) by mouth daily.  . Biotin 2500 MCG CAPS Take 5,000 mcg by mouth daily.   . calcium-vitamin D (OSCAL WITH D) 500-200 MG-UNIT per tablet Take 1 tablet by mouth 2 (two) times daily.   . ferrous sulfate 325 (65 FE) MG tablet Take 325 mg by mouth daily as needed.   . fish oil-omega-3 fatty acids 1000 MG capsule Take 1 g by mouth 2 (two) times daily.   . Glucosamine HCl 1000 MG TABS Take 1,000 mg by mouth 2 (two) times daily.   Marland Kitchen ibuprofen (ADVIL) 800 MG tablet Take 1 tablet (800 mg total) by mouth every 8 (eight) hours as needed.  Marland Kitchen levothyroxine (SYNTHROID) 137 MCG tablet Take 1 tablet (137 mcg total) by mouth daily before breakfast.  . lisinopril (ZESTRIL) 5 MG tablet Take 1 tablet (5 mg total) by mouth daily.  Marland Kitchen loratadine (CLARITIN) 10 MG tablet Take 10 mg by mouth daily as needed for allergies.  . Multiple Vitamin (MULTIVITAMIN PO) Take 1 tablet by mouth daily.   Marland Kitchen zinc gluconate 50 MG tablet Take 50 mg by mouth daily as needed.      Allergies:   Patient has no known allergies.   Social History   Tobacco Use  . Smoking status: Never Smoker  . Smokeless tobacco: Never Used  Vaping Use  . Vaping Use: Never used  Substance Use Topics  . Alcohol use: No  . Drug use: No     Family History: The patient's family history includes COPD in her father; Hypertension in her father and mother; Parkinson's disease in her mother.  ROS:   Please see the history of present illness.    All other systems reviewed and are negative.  EKGs/Labs/Other Studies Reviewed:    The following studies were reviewed today:  Recent Labs: 12/07/2019: BUN 17; Creatinine, Ser 0.63; Potassium 4.3; Sodium 136 02/28/2020: Hemoglobin 14.1; Platelets 194.0 06/06/2020: ALT 23; TSH 94.11  Recent Lipid Panel    Component Value Date/Time   CHOL 143 09/28/2019 1540   TRIG 75 09/28/2019 1540   HDL 61 09/28/2019 1540   CHOLHDL 2.3 09/28/2019 1540   LDLCALC 67  09/28/2019 1540    Physical Exam:    VS:  BP (!) 148/98   Pulse 67   Ht 5\' 4"  (1.626 m)   Wt 177 lb 3.2 oz (80.4 kg)   SpO2 98%   BMI 30.42 kg/m     Wt Readings from Last 5 Encounters:  07/02/20 177 lb 3.2 oz (80.4 kg)  06/11/20 177 lb 12.8 oz (80.6 kg)  04/30/20 170 lb 3.2 oz (77.2 kg)  03/27/20 163 lb (73.9 kg)  03/02/20 159 lb 3.2 oz (72.2 kg)    Constitutional: No acute distress Eyes: sclera non-icteric, normal conjunctiva and lids ENMT: normal dentition, moist mucous membranes Cardiovascular: regular rhythm, normal rate, no murmurs. S1 and S2 normal. Radial pulses normal bilaterally. No jugular venous distention.  Respiratory: clear to auscultation bilaterally GI : normal bowel sounds, soft and nontender. No  distention.   MSK: extremities warm, well perfused. No edema.  NEURO: grossly nonfocal exam, moves all extremities. PSYCH: alert and oriented x 3, normal mood and affect.   ASSESSMENT:    1. Essential hypertension   2. LVH (left ventricular hypertrophy)   3. History of breast cancer    PLAN:    Essential hypertension  LVH (left ventricular hypertrophy)  History of breast cancer   Continue lisinopril 5 mg daily. BP stable on home readings. Follow up in 6 mo or sooner if issues.   Total time of encounter: 20 minutes total time of encounter, including 15 minutes spent in face-to-face patient care on the date of this encounter. This time includes coordination of care and counseling regarding above mentioned problem list. Remainder of non-face-to-face time involved reviewing chart documents/testing relevant to the patient encounter and documentation in the medical record. I have independently reviewed documentation from referring provider.   Cherlynn Kaiser, MD [place]  [facility]    Medication Adjustments/Labs and Tests Ordered: Current medicines are reviewed at length with the patient today.  Concerns regarding medicines are outlined above.  No  orders of the defined types were placed in this encounter.  No orders of the defined types were placed in this encounter.   Patient Instructions  Medication Instructions:  No Changes In Medications at this time.  *If you need a refill on your cardiac medications before your next appointment, please call your pharmacy*  Lab Work: None Ordered At This Time.  If you have labs (blood work) drawn today and your tests are completely normal, you will receive your results only by: Marland Kitchen MyChart Message (if you have MyChart) OR . A paper copy in the mail If you have any lab test that is abnormal or we need to change your treatment, we will call you to review the results.  Testing/Procedures: None Ordered At This Time.   Follow-Up: At [facility] - , you and your health needs are our priority.  As part of our continuing mission to provide you with exceptional heart care, we have created designated Provider Care Teams.  These Care Teams include your primary Cardiologist (physician) and Advanced Practice Providers (APPs -  Physician Assistants and Nurse Practitioners) who all work together to provide you with the care you need, when you need it.  Your next appointment:   6 month(s)  The format for your next appointment:   In Person  Provider:   Cherlynn Kaiser, MD

## 2020-07-03 MED FILL — LISINOPRIL 5 MG TABS: 5 | 90 days supply | Qty: 90 | Fill #1

## 2020-07-11 MED FILL — LEVOTHYROXINE 137 MCG TAB: 137 | 30 days supply | Qty: 30 | Fill #1

## 2020-07-31 ENCOUNTER — Other Ambulatory Visit: Payer: 59

## 2020-08-01 ENCOUNTER — Other Ambulatory Visit (INDEPENDENT_AMBULATORY_CARE_PROVIDER_SITE_OTHER): Payer: 59

## 2020-08-01 ENCOUNTER — Other Ambulatory Visit: Payer: Self-pay

## 2020-08-01 DIAGNOSIS — E038 Other specified hypothyroidism: Secondary | ICD-10-CM

## 2020-08-01 DIAGNOSIS — R748 Abnormal levels of other serum enzymes: Secondary | ICD-10-CM | POA: Diagnosis not present

## 2020-08-01 DIAGNOSIS — E890 Postprocedural hypothyroidism: Secondary | ICD-10-CM

## 2020-08-01 LAB — T4, FREE: Free T4: 2.02 ng/dL — ABNORMAL HIGH (ref 0.60–1.60)

## 2020-08-01 LAB — TSH: TSH: 0.09 u[IU]/mL — ABNORMAL LOW (ref 0.35–4.50)

## 2020-08-01 LAB — ALKALINE PHOSPHATASE: Alkaline Phosphatase: 107 U/L (ref 39–117)

## 2020-08-07 ENCOUNTER — Ambulatory Visit: Payer: 59 | Admitting: Endocrinology

## 2020-08-07 ENCOUNTER — Other Ambulatory Visit: Payer: Self-pay

## 2020-08-07 ENCOUNTER — Encounter: Payer: Self-pay | Admitting: Endocrinology

## 2020-08-07 VITALS — BP 118/80 | HR 66 | Ht 63.75 in | Wt 177.4 lb

## 2020-08-07 DIAGNOSIS — E038 Other specified hypothyroidism: Secondary | ICD-10-CM

## 2020-08-07 DIAGNOSIS — E890 Postprocedural hypothyroidism: Secondary | ICD-10-CM

## 2020-08-07 NOTE — Patient Instructions (Signed)
Take 1/2 pill daily 

## 2020-08-07 NOTE — Progress Notes (Signed)
Patient ID: Andrea Castro, female   DOB: 10-11-1956, 64 y.o.   MRN: [id number]                                                                                                              Reason for Appointment: Follow-up of thyroid   Chief complaint: Follow-up   History of Present Illness:   History at baseline: For the last few months patient has had weight loss of 43 pounds She thinks this was from changing her diet and generally exercising more However she also feels like she has had shakiness of her entire body and also a feeling of fast heart rate She is starting to get hot flashes and generally feeling hot more than usual recently However she does not think she has any fatigue Her arms may be a little weaker than usual and she is not able to do as much physical work with her arms She does not think she is feeling more tired  Thyroid levels were checked in 12/20 and were abnormal as below  Recent history:  She had I-131 treatment on 02/03/2020 with 17 mCi for Graves' disease  Her thyroid levels were normal after the treatment but she became significantly hypothyroid on her last visit in 8/21 She was also symptomatic with significant fatigue and weight gain  She is now taking LEVOTHYROXINE 137 mcg daily Lately has not been complaining of any fatigue May feel warm in the mornings but has no symptoms of shakiness or palpitations  Now her free T4 is significantly higher at 2.02 compared to 0.6 TSH is suppressed   Wt Readings from Last 3 Encounters:  08/07/20 177 lb 6.4 oz (80.5 kg)  07/02/20 177 lb 3.2 oz (80.4 kg)  06/11/20 177 lb 12.8 oz (80.6 kg)      Thyroid function tests as follows:     Lab Results  Component Value Date   FREET4 2.02 (H) 08/01/2020   FREET4 0.60 06/06/2020   FREET4 0.93 04/25/2020   T3FREE 18.3 (H) 01/23/2020   T3FREE 6.2 (H) 12/07/2019   T3FREE 7.2 (H) 11/07/2019   TSH 0.09 (L) 08/01/2020   TSH 94.11 (H) 06/06/2020   TSH 0.03 (L)  04/25/2020   Free thyroxine index >12.1 with total T4 21.6 as of 10/11/2019  Lab Results  Component Value Date   THYROTRECAB 36.20 (H) 11/07/2019   THYROTRECAB 30.30 (H) 10/17/2019     Allergies as of 08/07/2020   No Known Allergies     Medication List       Accurate as of August 07, 2020  4:16 PM. If you have any questions, ask your nurse or doctor.        aspirin EC 81 MG tablet Take 1 tablet (81 mg total) by mouth daily.   Biotin 2500 MCG Caps Take 5,000 mcg by mouth daily.   calcium-vitamin D 500-200 MG-UNIT tablet Commonly known as: OSCAL WITH D Take 1 tablet by mouth 2 (two) times daily.   ferrous sulfate 325 (65 FE) MG tablet Take  325 mg by mouth daily as needed.   fish oil-omega-3 fatty acids 1000 MG capsule Take 1 g by mouth 2 (two) times daily.   Glucosamine HCl 1000 MG Tabs Take 1,000 mg by mouth 2 (two) times daily.   ibuprofen 800 MG tablet Commonly known as: ADVIL Take 1 tablet (800 mg total) by mouth every 8 (eight) hours as needed.   levothyroxine 137 MCG tablet Commonly known as: Synthroid Take 1 tablet (137 mcg total) by mouth daily before breakfast.   lisinopril 5 MG tablet Commonly known as: ZESTRIL Take 1 tablet (5 mg total) by mouth daily.   loratadine 10 MG tablet Commonly known as: CLARITIN Take 10 mg by mouth daily as needed for allergies.   MULTIVITAMIN PO Take 1 tablet by mouth daily.   zinc gluconate 50 MG tablet Take 50 mg by mouth daily as needed.           Past Medical History:  Diagnosis Date  . ACL (anterior cruciate ligament) tear 2002  . Arthritis   . Atypical nevus 05/27/1996   Right Rim Ear-Slight  . Cancer [facility]) 2011   left breast  . Complication of anesthesia   . History of breast cancer   . Hyphema of right eye   . PONV (postoperative nausea and vomiting)   . Ruptured disc, thoracic 08/1983    Past Surgical History:  Procedure Laterality Date  . BACK SURGERY  1984   ruptured disc  . CYSTOCELE  REPAIR N/A 09/01/2019   Procedure: ANTERIOR REPAIR (CYSTOCELE);  Surgeon: Newton Pigg, MD;  Location: [facility];  Service: Gynecology;  Laterality: N/A;  . HERNIA REPAIR  02/20/11   ventral hernia  . KNEE ARTHROSCOPY Left 08/17/2015   Procedure: Left Knee Arthroscopy and Debridement;  Surgeon: Newt Minion, MD;  Location: [place];  Service: Orthopedics;  Laterality: Left;  . KNEE ARTHROSCOPY WITH ANTERIOR CRUCIATE LIGAMENT (ACL) REPAIR Left 03/2010  . MASTECTOMY  10/30/10   right, total  . MASTECTOMY  05/22/10   left cancer/chemo and radiation  . nipple construction  08/2011   tattoo at another date  . PORT-A-CATH REMOVAL  01/09/2012   Procedure: MINOR REMOVAL PORT-A-CATH;  Surgeon: Haywood Lasso, MD;  Location: [place];  Service: General;  Laterality: N/A;  . PORTACATH PLACEMENT  12/17/09  . PUBOVAGINAL SLING N/A 09/01/2019   Procedure: Gaynelle Arabian;  Surgeon: Bjorn Loser, MD;  Location: [facility];  Service: Urology;  Laterality: N/A;  . RECONSTRUCTION BREAST W/ TRAM FLAP  02/20/11   bilateral  . RECTOCELE REPAIR N/A 09/01/2019   Procedure: POSSIBLE POSTERIOR REPAIR (RECTOCELE);  Surgeon: Newton Pigg, MD;  Location: [facility];  Service: Gynecology;  Laterality: N/A;  possible  . torn ligament  2002   left knee  . TUBAL LIGATION  2004  . VAGINAL HYSTERECTOMY N/A 09/01/2019   Procedure: HYSTERECTOMY VAGINAL;  Surgeon: Newton Pigg, MD;  Location: [facility];  Service: Gynecology;  Laterality: N/A;    Family History  Problem Relation Age of Onset  . COPD Father   . Hypertension Father   . Hypertension Mother   . Parkinson's disease Mother     Social History:  reports that she has never smoked. She has never used smokeless tobacco. She reports that she does not drink alcohol and does not use drugs.  Allergies: No Known Allergies   Review of Systems  She has normalized alkaline  phosphatase finally Other liver  enzymes are normal  Lab Results  Component Value Date   ALKPHOS 107 08/01/2020   ALKPHOS 84 08/11/2017      Examination:   BP 118/80   Pulse 66   Ht 5' 3.75" (1.619 m)   Wt 177 lb 6.4 oz (80.5 kg)   SpO2 96%   BMI 30.69 kg/m    Thyroid is not palpable No tremor Skin appears not unusually warm Biceps  reflexes appear normal   Assessment/Plan:  Post ablative hypothyroidism:  She had finally become hypothyroid about 4 months after her I-131 treatment  However despite her baseline TSH of 94 she is now mildly hypothyroid on supplementation with 137 mcg levothyroxine Appears asymptomatic Not clear if she is getting a recurrence of her hyperthyroidism or recovering from stenting of the thyroid  For now we will reduce her dosage to half a tablet of the 137 mcg levothyroxine and she will follow up in another month   Elayne Snare 08/07/2020, 4:16 PM    Note: This office note was prepared with Dragon voice recognition system technology. Any transcriptional errors that result from this process are unintentional.

## 2020-08-13 MED FILL — LEVOTHYROXINE 137 MCG TAB: 137 | 30 days supply | Qty: 30 | Fill #2

## 2020-09-06 ENCOUNTER — Telehealth: Payer: Self-pay | Admitting: *Deleted

## 2020-09-06 NOTE — Telephone Encounter (Signed)
A message was left, re: her follow up visit. 

## 2020-09-07 ENCOUNTER — Other Ambulatory Visit: Payer: Self-pay

## 2020-09-07 ENCOUNTER — Other Ambulatory Visit (INDEPENDENT_AMBULATORY_CARE_PROVIDER_SITE_OTHER): Payer: 59

## 2020-09-07 DIAGNOSIS — E038 Other specified hypothyroidism: Secondary | ICD-10-CM

## 2020-09-07 DIAGNOSIS — E890 Postprocedural hypothyroidism: Secondary | ICD-10-CM

## 2020-09-07 LAB — T4, FREE: Free T4: 1.19 ng/dL (ref 0.60–1.60)

## 2020-09-07 LAB — TSH: TSH: 5.34 u[IU]/mL — ABNORMAL HIGH (ref 0.35–4.50)

## 2020-09-12 ENCOUNTER — Encounter: Payer: Self-pay | Admitting: Endocrinology

## 2020-09-12 ENCOUNTER — Other Ambulatory Visit: Payer: Self-pay

## 2020-09-12 ENCOUNTER — Other Ambulatory Visit: Payer: Self-pay | Admitting: Endocrinology

## 2020-09-12 ENCOUNTER — Ambulatory Visit: Payer: 59 | Admitting: Endocrinology

## 2020-09-12 VITALS — BP 148/98 | HR 71 | Ht 64.0 in | Wt 181.6 lb

## 2020-09-12 DIAGNOSIS — E038 Other specified hypothyroidism: Secondary | ICD-10-CM | POA: Diagnosis not present

## 2020-09-12 DIAGNOSIS — E890 Postprocedural hypothyroidism: Secondary | ICD-10-CM

## 2020-09-12 MED ORDER — LEVOTHYROXINE SODIUM 75 MCG PO TABS: ORAL_TABLET | ORAL | 3 refills | Status: DC

## 2020-09-12 MED FILL — LEVOTHYROXINE 75 MCG TABLET: 75 | 84 days supply | Qty: 90 | Fill #0

## 2020-09-12 NOTE — Progress Notes (Signed)
Patient ID: Andrea Castro, female   DOB: 29-Jun-1956, 64 y.o.   MRN: [id number]                                                                                                              Reason for Appointment: Follow-up of thyroid   Chief complaint: Follow-up   History of Present Illness:   History at baseline: For the last few months patient has had weight loss of 43 pounds She thinks this was from changing her diet and generally exercising more However she also feels like she has had shakiness of her entire body and also a feeling of fast heart rate She is starting to get hot flashes and generally feeling hot more than usual recently However she does not think she has any fatigue Her arms may be a little weaker than usual and she is not able to do as much physical work with her arms She does not think she is feeling more tired  Thyroid levels were checked in 12/20 and were abnormal as below  Recent history:  She had I-131 treatment on 02/03/2020 with 17 mCi for Graves' disease  Her thyroid levels were normal after the treatment but she became significantly hypothyroid on her visit in 8/21 She was also symptomatic with significant fatigue and weight gain  However with taking levothyroxine 137 mcg daily she had subclinical hyperthyroidism with free T4 2.02 Since 10/21 she is taking half tablet of the LEVOTHYROXINE 137 mcg daily  With this she is feeling fairly good with her energy level and her fatigue may be a little better also She had previously some heat intolerance but this is better  Her weight has gone up 4 pounds  Now her free T4 is back to normal at 1.2 TSH which was suppressed and now increased at 5.3   Wt Readings from Last 3 Encounters:  09/12/20 181 lb 9.6 oz (82.4 kg)  08/07/20 177 lb 6.4 oz (80.5 kg)  07/02/20 177 lb 3.2 oz (80.4 kg)      Thyroid function tests as follows:     Lab Results  Component Value Date   FREET4 1.19 09/07/2020   FREET4  2.02 (H) 08/01/2020   FREET4 0.60 06/06/2020   T3FREE 18.3 (H) 01/23/2020   T3FREE 6.2 (H) 12/07/2019   T3FREE 7.2 (H) 11/07/2019   TSH 5.34 (H) 09/07/2020   TSH 0.09 (L) 08/01/2020   TSH 94.11 (H) 06/06/2020   Free thyroxine index >12.1 with total T4 21.6 as of 10/11/2019  Lab Results  Component Value Date   THYROTRECAB 36.20 (H) 11/07/2019   THYROTRECAB 30.30 (H) 10/17/2019     Allergies as of 09/12/2020   No Known Allergies     Medication List       Accurate as of September 12, 2020  9:10 AM. If you have any questions, ask your nurse or doctor.        aspirin EC 81 MG tablet Take 1 tablet (81 mg total) by mouth daily.   Biotin 2500  MCG Caps Take 5,000 mcg by mouth daily.   calcium-vitamin D 500-200 MG-UNIT tablet Commonly known as: OSCAL WITH D Take 1 tablet by mouth 2 (two) times daily.   ferrous sulfate 325 (65 FE) MG tablet Take 325 mg by mouth daily as needed.   fish oil-omega-3 fatty acids 1000 MG capsule Take 1 g by mouth 2 (two) times daily.   Glucosamine HCl 1000 MG Tabs Take 1,000 mg by mouth 2 (two) times daily.   ibuprofen 800 MG tablet Commonly known as: ADVIL Take 1 tablet (800 mg total) by mouth every 8 (eight) hours as needed.   levothyroxine 137 MCG tablet Commonly known as: Synthroid Take 1 tablet (137 mcg total) by mouth daily before breakfast.   lisinopril 5 MG tablet Commonly known as: ZESTRIL Take 1 tablet (5 mg total) by mouth daily.   loratadine 10 MG tablet Commonly known as: CLARITIN Take 10 mg by mouth daily as needed for allergies.   MULTIVITAMIN PO Take 1 tablet by mouth daily.   Pfizer-BioNTech COVID-19 Vacc 30 MCG/0.3ML injection Generic drug: COVID-19 mRNA vaccine (Pfizer)   zinc gluconate 50 MG tablet Take 50 mg by mouth daily as needed.           Past Medical History:  Diagnosis Date  . ACL (anterior cruciate ligament) tear 2002  . Arthritis   . Atypical nevus 05/27/1996   Right Rim Ear-Slight  .  Cancer [facility]) 2011   left breast  . Complication of anesthesia   . History of breast cancer   . Hyphema of right eye   . PONV (postoperative nausea and vomiting)   . Ruptured disc, thoracic 08/1983    Past Surgical History:  Procedure Laterality Date  . BACK SURGERY  1984   ruptured disc  . CYSTOCELE REPAIR N/A 09/01/2019   Procedure: ANTERIOR REPAIR (CYSTOCELE);  Surgeon: Newton Pigg, MD;  Location: [facility];  Service: Gynecology;  Laterality: N/A;  . HERNIA REPAIR  02/20/11   ventral hernia  . KNEE ARTHROSCOPY Left 08/17/2015   Procedure: Left Knee Arthroscopy and Debridement;  Surgeon: Newt Minion, MD;  Location: [place];  Service: Orthopedics;  Laterality: Left;  . KNEE ARTHROSCOPY WITH ANTERIOR CRUCIATE LIGAMENT (ACL) REPAIR Left 03/2010  . MASTECTOMY  10/30/10   right, total  . MASTECTOMY  05/22/10   left cancer/chemo and radiation  . nipple construction  08/2011   tattoo at another date  . PORT-A-CATH REMOVAL  01/09/2012   Procedure: MINOR REMOVAL PORT-A-CATH;  Surgeon: Haywood Lasso, MD;  Location: [place];  Service: General;  Laterality: N/A;  . PORTACATH PLACEMENT  12/17/09  . PUBOVAGINAL SLING N/A 09/01/2019   Procedure: Gaynelle Arabian;  Surgeon: Bjorn Loser, MD;  Location: [facility];  Service: Urology;  Laterality: N/A;  . RECONSTRUCTION BREAST W/ TRAM FLAP  02/20/11   bilateral  . RECTOCELE REPAIR N/A 09/01/2019   Procedure: POSSIBLE POSTERIOR REPAIR (RECTOCELE);  Surgeon: Newton Pigg, MD;  Location: [facility];  Service: Gynecology;  Laterality: N/A;  possible  . torn ligament  2002   left knee  . TUBAL LIGATION  2004  . VAGINAL HYSTERECTOMY N/A 09/01/2019   Procedure: HYSTERECTOMY VAGINAL;  Surgeon: Newton Pigg, MD;  Location: [facility];  Service: Gynecology;  Laterality: N/A;    Family History  Problem Relation Age of Onset  . COPD Father   . Hypertension  Father   . Hypertension Mother   .  Parkinson's disease Mother     Social History:  reports that she has never smoked. She has never used smokeless tobacco. She reports that she does not drink alcohol and does not use drugs.  Allergies: No Known Allergies   Review of Systems  She has normalized alkaline phosphatase which was high when her thyroid was overactive  Lab Results  Component Value Date   ALKPHOS 107 08/01/2020   ALKPHOS 84 08/11/2017   HYPERTENSION: She says her blood pressure is high because of recent stress and not taking her blood pressure medication regularly   Examination:   BP (!) 148/98   Pulse 71   Ht 5\' 4"  (1.626 m)   Wt 181 lb 9.6 oz (82.4 kg)   SpO2 96%   BMI 31.17 kg/m       Assessment/Plan:  Post ablative hypothyroidism:  She had become hypothyroid about 4 months after her I-131 treatment  However despite her baseline TSH of 94 she is only requiring a small dose of levothyroxine supplement Currently with the equivalent of 68 mcg levothyroxine her TSH has started going up to 5.3, free T4 back to normal  Subjectively doing well  We will increase her levothyroxine by about 12 mcg daily New prescription for 75 mcg levothyroxine sent, she will take 7-1/2 tablets a week and follow-up in 2 months   Andrea Castro 09/12/2020, 9:10 AM    Note: This office note was prepared with Dragon voice recognition system technology. Any transcriptional errors that result from this process are unintentional.

## 2020-10-31 ENCOUNTER — Other Ambulatory Visit: Payer: Self-pay | Admitting: *Deleted

## 2020-10-31 MED ORDER — LISINOPRIL 5 MG PO TABS: 5.0000 mg | ORAL_TABLET | Freq: Every day | ORAL | 0 refills | Status: DC

## 2020-11-08 ENCOUNTER — Other Ambulatory Visit: Payer: Self-pay

## 2020-11-12 ENCOUNTER — Other Ambulatory Visit (INDEPENDENT_AMBULATORY_CARE_PROVIDER_SITE_OTHER): Payer: 59

## 2020-11-12 ENCOUNTER — Other Ambulatory Visit: Payer: Self-pay

## 2020-11-12 DIAGNOSIS — E038 Other specified hypothyroidism: Secondary | ICD-10-CM

## 2020-11-12 DIAGNOSIS — E890 Postprocedural hypothyroidism: Secondary | ICD-10-CM

## 2020-11-12 LAB — TSH: TSH: 11.57 u[IU]/mL — ABNORMAL HIGH (ref 0.35–4.50)

## 2020-11-12 LAB — T4, FREE: Free T4: 1.14 ng/dL (ref 0.60–1.60)

## 2020-11-15 ENCOUNTER — Encounter: Payer: Self-pay | Admitting: Endocrinology

## 2020-11-15 ENCOUNTER — Ambulatory Visit: Payer: 59 | Admitting: Endocrinology

## 2020-11-15 ENCOUNTER — Other Ambulatory Visit: Payer: Self-pay | Admitting: Endocrinology

## 2020-11-15 ENCOUNTER — Other Ambulatory Visit: Payer: Self-pay

## 2020-11-15 ENCOUNTER — Telehealth: Payer: Self-pay | Admitting: Internal Medicine

## 2020-11-15 VITALS — BP 128/84 | HR 67 | Ht 64.0 in | Wt 187.2 lb

## 2020-11-15 DIAGNOSIS — E038 Other specified hypothyroidism: Secondary | ICD-10-CM | POA: Diagnosis not present

## 2020-11-15 DIAGNOSIS — E890 Postprocedural hypothyroidism: Secondary | ICD-10-CM

## 2020-11-15 MED ORDER — LEVOTHYROXINE SODIUM 100 MCG PO TABS: 100.0000 ug | ORAL_TABLET | Freq: Every day | ORAL | 3 refills | Status: DC

## 2020-11-15 MED FILL — LEVOTHYROXINE 100 MCG TABLE: 100 | 90 days supply | Qty: 90 | Fill #0

## 2020-11-15 NOTE — Progress Notes (Signed)
Patient ID: Andrea Castro, female   DOB: Mar 02, 1956, 65 y.o.   MRN: [id number]                                                                                                              Reason for Appointment: Follow-up of thyroid   Chief complaint: Follow-up   History of Present Illness:   History at baseline: At onset she had weight loss of 43 pounds She thinks this was from changing her diet and generally exercising more However she also feels like she has had shakiness of her entire body and also a feeling of fast heart rate She is starting to get hot flashes and generally feeling hot more than usual recently However she does not think she has any fatigue Her arms may be a little weaker than usual and she is not able to do as much physical work with her arms She does not think she is feeling more tired  Thyroid levels were checked in 12/20 and were abnormal as below  Recent history:  She had I-131 treatment on 02/03/2020 with 17 mCi for Graves' disease  Her thyroid levels were normal after the treatment but she became significantly hypothyroid on her visit in 8/21 She was also symptomatic with significant fatigue and weight gain  However with taking levothyroxine 137 mcg daily she had subclinical hyperthyroidism with free T4 2.02 Since 10/21 she has been requiring relatively higher doses of levothyroxine  Recently fairly good with her energy level No complaints of fatigue, she tends to have some somnolence if she is sitting still which is not new May have some cold intolerance now  She thinks she has gained 6 pounds  Her dose was increased from average 68 mcg to about 80 using 7-1/2 tablets/week of the 75 mcg dose  Although her TSH was only 5.3 it is now up to 11.6     Wt Readings from Last 3 Encounters:  11/15/20 187 lb 3.2 oz (84.9 kg)  09/12/20 181 lb 9.6 oz (82.4 kg)  08/07/20 177 lb 6.4 oz (80.5 kg)      Thyroid function tests as follows:     Lab  Results  Component Value Date   TSH 11.57 (H) 11/12/2020   TSH 5.34 (H) 09/07/2020   TSH 0.09 (L) 08/01/2020   FREET4 1.14 11/12/2020   FREET4 1.19 09/07/2020   FREET4 2.02 (H) 08/01/2020    Free thyroxine index >12.1 with total T4 21.6 as of 10/11/2019  Lab Results  Component Value Date   THYROTRECAB 36.20 (H) 11/07/2019   THYROTRECAB 30.30 (H) 10/17/2019     Allergies as of 11/15/2020   No Known Allergies     Medication List       Accurate as of November 15, 2020  4:37 PM. If you have any questions, ask your nurse or doctor.        aspirin EC 81 MG tablet Take 1 tablet (81 mg total) by mouth daily.   Biotin 2500 MCG Caps Take 5,000 mcg by  mouth daily.   calcium-vitamin D 500-200 MG-UNIT tablet Commonly known as: OSCAL WITH D Take 1 tablet by mouth 2 (two) times daily.   ferrous sulfate 325 (65 FE) MG tablet Take 325 mg by mouth daily as needed.   fish oil-omega-3 fatty acids 1000 MG capsule Take 1 g by mouth 2 (two) times daily.   Glucosamine HCl 1000 MG Tabs Take 1,000 mg by mouth 2 (two) times daily.   ibuprofen 800 MG tablet Commonly known as: ADVIL Take 1 tablet (800 mg total) by mouth every 8 (eight) hours as needed.   levothyroxine 100 MCG tablet Commonly known as: SYNTHROID Take 1 tablet (100 mcg total) by mouth daily. What changed:   medication strength  how much to take  how to take this  when to take this  additional instructions Changed by: Elayne Snare, MD   lisinopril 5 MG tablet Commonly known as: ZESTRIL Take 1 tablet (5 mg total) by mouth daily. (Needs to be seen before next refill)   loratadine 10 MG tablet Commonly known as: CLARITIN Take 10 mg by mouth daily as needed for allergies.   MULTIVITAMIN PO Take 1 tablet by mouth daily.   Pfizer-BioNTech COVID-19 Vacc 30 MCG/0.3ML injection Generic drug: COVID-19 mRNA vaccine (Pfizer)   zinc gluconate 50 MG tablet Take 50 mg by mouth daily as needed.           Past  Medical History:  Diagnosis Date  . ACL (anterior cruciate ligament) tear 2002  . Arthritis   . Atypical nevus 05/27/1996   Right Rim Ear-Slight  . Cancer [facility]) 2011   left breast  . Complication of anesthesia   . History of breast cancer   . Hyphema of right eye   . PONV (postoperative nausea and vomiting)   . Ruptured disc, thoracic 08/1983    Past Surgical History:  Procedure Laterality Date  . BACK SURGERY  1984   ruptured disc  . CYSTOCELE REPAIR N/A 09/01/2019   Procedure: ANTERIOR REPAIR (CYSTOCELE);  Surgeon: Newton Pigg, MD;  Location: [facility];  Service: Gynecology;  Laterality: N/A;  . HERNIA REPAIR  02/20/11   ventral hernia  . KNEE ARTHROSCOPY Left 08/17/2015   Procedure: Left Knee Arthroscopy and Debridement;  Surgeon: Newt Minion, MD;  Location: [place];  Service: Orthopedics;  Laterality: Left;  . KNEE ARTHROSCOPY WITH ANTERIOR CRUCIATE LIGAMENT (ACL) REPAIR Left 03/2010  . MASTECTOMY  10/30/10   right, total  . MASTECTOMY  05/22/10   left cancer/chemo and radiation  . nipple construction  08/2011   tattoo at another date  . PORT-A-CATH REMOVAL  01/09/2012   Procedure: MINOR REMOVAL PORT-A-CATH;  Surgeon: Haywood Lasso, MD;  Location: [place];  Service: General;  Laterality: N/A;  . PORTACATH PLACEMENT  12/17/09  . PUBOVAGINAL SLING N/A 09/01/2019   Procedure: Gaynelle Arabian;  Surgeon: Bjorn Loser, MD;  Location: [facility];  Service: Urology;  Laterality: N/A;  . RECONSTRUCTION BREAST W/ TRAM FLAP  02/20/11   bilateral  . RECTOCELE REPAIR N/A 09/01/2019   Procedure: POSSIBLE POSTERIOR REPAIR (RECTOCELE);  Surgeon: Newton Pigg, MD;  Location: [facility];  Service: Gynecology;  Laterality: N/A;  possible  . torn ligament  2002   left knee  . TUBAL LIGATION  2004  . VAGINAL HYSTERECTOMY N/A 09/01/2019   Procedure: HYSTERECTOMY VAGINAL;  Surgeon: Newton Pigg, MD;  Location:  [facility];  Service: Gynecology;  Laterality: N/A;  Family History  Problem Relation Age of Onset  . COPD Father   . Hypertension Father   . Hypertension Mother   . Parkinson's disease Mother     Social History:  reports that she has never smoked. She has never used smokeless tobacco. She reports that she does not drink alcohol and does not use drugs.  Allergies: No Known Allergies   Review of Systems  She has normalized alkaline phosphatase which was high when her thyroid was overactive  Lab Results  Component Value Date   ALKPHOS 107 08/01/2020   ALKPHOS 84 08/11/2017   HYPERTENSION: sees PCP for this but is having difficulty getting her prescription refilled   Examination:   BP 128/84   Pulse 67   Ht 5\' 4"  (1.626 m)   Wt 187 lb 3.2 oz (84.9 kg)   SpO2 97%   BMI 32.13 kg/m       Assessment/Plan:  Post ablative hypothyroidism:  She had become hypothyroid about 4 months after her I-131 treatment  Her thyroid levels have fluctuated significantly since after the treatment More recently has required gradually increasing doses However with the equivalent of 80 mcg levothyroxine her TSH is still high at 11.6 although she is not appearing symptomatic Has had some weight gain and mild cold intolerance  Plan: Increase the levothyroxine further by 25 mcg now New prescription for 100 mcg levothyroxine sent, she will take 7-1/2 tablets a week and follow-up in 2 months   Elayne Snare 11/15/2020, 4:37 PM    Note: This office note was prepared with Dragon voice recognition system technology. Any transcriptional errors that result from this process are unintentional.

## 2020-11-15 NOTE — Telephone Encounter (Signed)
*  STAT* If patient is at the pharmacy, call can be transferred to refill team.   1. Which medications need to be refilled? (please list name of each medication and dose if known) lisinopril (ZESTRIL) 5 MG tablet  2. Which pharmacy/location (including street and city if local pharmacy) is medication to be sent to? [place], [place].  3. Do they need a 30 day or 90 day supply? 90 day supply    Patient is completely out of medication.

## 2020-11-16 ENCOUNTER — Other Ambulatory Visit (HOSPITAL_COMMUNITY): Payer: Self-pay | Admitting: Family Medicine

## 2020-11-16 MED FILL — LISINOPRIL 5 MG TABS: 5 | 30 days supply | Qty: 30 | Fill #0

## 2020-11-19 ENCOUNTER — Other Ambulatory Visit: Payer: Self-pay | Admitting: Family Medicine

## 2020-11-19 ENCOUNTER — Other Ambulatory Visit: Payer: Self-pay | Admitting: *Deleted

## 2020-11-19 MED ORDER — LISINOPRIL 5 MG PO TABS: 5.0000 mg | ORAL_TABLET | Freq: Every day | ORAL | 0 refills | Status: DC

## 2020-12-07 ENCOUNTER — Other Ambulatory Visit: Payer: Self-pay

## 2020-12-07 ENCOUNTER — Encounter: Payer: Self-pay | Admitting: Family Medicine

## 2020-12-07 ENCOUNTER — Other Ambulatory Visit: Payer: Self-pay | Admitting: Family Medicine

## 2020-12-07 ENCOUNTER — Ambulatory Visit (INDEPENDENT_AMBULATORY_CARE_PROVIDER_SITE_OTHER): Payer: 59 | Admitting: Family Medicine

## 2020-12-07 VITALS — BP 145/88 | HR 84 | Temp 97.4°F | Resp 20 | Ht 64.0 in | Wt 186.5 lb

## 2020-12-07 DIAGNOSIS — E559 Vitamin D deficiency, unspecified: Secondary | ICD-10-CM | POA: Diagnosis not present

## 2020-12-07 DIAGNOSIS — Z853 Personal history of malignant neoplasm of breast: Secondary | ICD-10-CM

## 2020-12-07 DIAGNOSIS — Z90710 Acquired absence of both cervix and uterus: Secondary | ICD-10-CM | POA: Diagnosis not present

## 2020-12-07 DIAGNOSIS — Z0000 Encounter for general adult medical examination without abnormal findings: Secondary | ICD-10-CM

## 2020-12-07 DIAGNOSIS — Z0001 Encounter for general adult medical examination with abnormal findings: Secondary | ICD-10-CM | POA: Diagnosis not present

## 2020-12-07 DIAGNOSIS — I10 Essential (primary) hypertension: Secondary | ICD-10-CM

## 2020-12-07 LAB — URINALYSIS
Bilirubin, UA: NEGATIVE
Glucose, UA: NEGATIVE
Ketones, UA: NEGATIVE
Nitrite, UA: NEGATIVE
Protein,UA: NEGATIVE
Specific Gravity, UA: <1.005 — ABNORMAL LOW (ref 1.005–1.030)
Urobilinogen, Ur: 0.2 mg/dL (ref 0.2–1.0)
pH, UA: 5.5 (ref 5.0–7.5)

## 2020-12-07 MED ORDER — LISINOPRIL 5 MG PO TABS
5.0000 mg | ORAL_TABLET | Freq: Every day | ORAL | 1 refills | Status: DC
Start: 2020-12-07 — End: 2020-12-07

## 2020-12-07 NOTE — Progress Notes (Signed)
Subjective:  Patient ID: Andrea Castro, female    DOB: 1956/01/02  Age: 65 y.o. MRN: [id number]  CC: No chief complaint on file.    HPI Andrea Castro presents for aNNUAL EXAM. Takes low dose of lisinopril for HTN. Has a mild cough only. Has white coat HTN as well. Works in [employer] office. Has nurses check BP periodically, and it is fine.   Thyroid radiation ablation last year. Tx with Dr. Dwyane Dee.   Depression screen [facility] 2/9 12/07/2020 11/16/2019 10/17/2019  Decreased Interest 0 0 0  Down, Depressed, Hopeless 0 0 0  PHQ - 2 Score 0 0 0    History Andrea Castro has a past medical history of ACL (anterior cruciate ligament) tear (2002), Arthritis, Atypical nevus (05/27/1996), Cancer ([place]) (8676), Complication of anesthesia, History of breast cancer, Hyphema of right eye, PONV (postoperative nausea and vomiting), and Ruptured disc, thoracic (08/1983).   She has a past surgical history that includes Mastectomy (10/30/10); Portacath placement (12/17/09); Back surgery (1984); torn ligament (2002); Tubal ligation (2004); Mastectomy (05/22/10); Reconstruction breast w/ tram flap (02/20/11); Hernia repair (02/20/11); Port-a-cath removal (01/09/2012); nipple construction (08/2011); Knee arthroscopy with anterior cruciate ligament (acl) repair (Left, 03/2010); Knee arthroscopy (Left, 08/17/2015); Vaginal hysterectomy (N/A, 09/01/2019); Cystocele repair (N/A, 09/01/2019); Rectocele repair (N/A, 09/01/2019); and Pubovaginal sling (N/A, 09/01/2019).   Her family history includes COPD in her father; Hypertension in her father and mother; Parkinson's disease in her mother.She reports that she has never smoked. She has never used smokeless tobacco. She reports that she does not drink alcohol and does not use drugs.    ROS Review of Systems  Constitutional: Negative for appetite change, chills, diaphoresis, fatigue, fever and unexpected weight change.  HENT: Negative for congestion, ear pain, hearing loss,  postnasal drip, rhinorrhea, sneezing, sore throat and trouble swallowing.   Eyes: Negative for pain.  Respiratory: Negative for cough, chest tightness and shortness of breath.   Cardiovascular: Negative for chest pain and palpitations.  Gastrointestinal: Negative for abdominal pain, constipation, diarrhea, nausea and vomiting.  Endocrine: Negative for cold intolerance, heat intolerance, polydipsia, polyphagia and polyuria.  Genitourinary: Negative for dysuria, frequency and menstrual problem.  Musculoskeletal: Negative for arthralgias and joint swelling.  Skin: Negative for rash.  Allergic/Immunologic: Negative for environmental allergies.  Neurological: Negative for dizziness, weakness, numbness and headaches.  Psychiatric/Behavioral: Negative for agitation and dysphoric mood.    Objective:  BP (!) 145/88   Pulse 84   Temp (!) 97.4 F (36.3 C) (Temporal)   Resp 20   Ht 5' 4" (1.626 m)   Wt 186 lb 8 oz (84.6 kg)   SpO2 97%   BMI 32.01 kg/m   BP Readings from Last 3 Encounters:  12/07/20 (!) 145/88  11/15/20 128/84  09/12/20 (!) 148/98    Wt Readings from Last 3 Encounters:  12/07/20 186 lb 8 oz (84.6 kg)  11/15/20 187 lb 3.2 oz (84.9 kg)  09/12/20 181 lb 9.6 oz (82.4 kg)     Physical Exam Constitutional:      General: She is not in acute distress.    Appearance: She is well-developed.  HENT:     Head: Normocephalic and atraumatic.  Eyes:     Conjunctiva/sclera: Conjunctivae normal.     Pupils: Pupils are equal, round, and reactive to light.  Neck:     Thyroid: No thyromegaly.  Cardiovascular:     Rate and Rhythm: Normal rate and regular rhythm.     Heart sounds: Normal heart sounds.  No murmur heard.   Pulmonary:     Effort: Pulmonary effort is normal. No respiratory distress.     Breath sounds: Normal breath sounds. No wheezing or rales.  Abdominal:     General: Bowel sounds are normal. There is no distension.     Palpations: Abdomen is soft.      Tenderness: There is no abdominal tenderness.  Musculoskeletal:        General: Normal range of motion.     Cervical back: Normal range of motion and neck supple.  Lymphadenopathy:     Cervical: No cervical adenopathy.  Skin:    General: Skin is warm and dry.  Neurological:     Mental Status: She is alert and oriented to person, place, and time.  Psychiatric:        Behavior: Behavior normal.        Thought Content: Thought content normal.        Judgment: Judgment normal.       Assessment & Plan:   Diagnoses and all orders for this visit:  Essential hypertension -     CBC with Differential/Platelet -     CMP14+EGFR -     Lipid panel -     Urinalysis -     VITAMIN D 25 Hydroxy (Vit-D Deficiency, Fractures)  Well adult exam -     CBC with Differential/Platelet -     CMP14+EGFR -     Lipid panel -     Urinalysis -     VITAMIN D 25 Hydroxy (Vit-D Deficiency, Fractures)  Vitamin D deficiency -     VITAMIN D 25 Hydroxy (Vit-D Deficiency, Fractures)  History of left breast cancer  S/P vaginal hysterectomy       I have discontinued Andrea Castro's Pfizer-BioNTech COVID-19 Vacc. I am also having her maintain her Glucosamine HCl, fish oil-omega-3 fatty acids, Multiple Vitamin (MULTIVITAMIN PO), Biotin, calcium-vitamin D, zinc gluconate, ferrous sulfate, loratadine, aspirin EC, ibuprofen, levothyroxine, and lisinopril.  Allergies as of 12/07/2020   No Known Allergies     Medication List       Accurate as of December 07, 2020 10:05 AM. If you have any questions, ask your nurse or doctor.        STOP taking these medications   Pfizer-BioNTech COVID-19 Vacc 30 MCG/0.3ML injection Generic drug: COVID-19 mRNA vaccine Therapist, music) Stopped by: Claretta Fraise, MD     TAKE these medications   aspirin EC 81 MG tablet Take 1 tablet (81 mg total) by mouth daily.   Biotin 2500 MCG Caps Take 5,000 mcg by mouth daily.   calcium-vitamin D 500-200 MG-UNIT tablet Commonly  known as: OSCAL WITH D Take 1 tablet by mouth 2 (two) times daily.   ferrous sulfate 325 (65 FE) MG tablet Take 325 mg by mouth daily as needed.   fish oil-omega-3 fatty acids 1000 MG capsule Take 1 g by mouth 2 (two) times daily.   Glucosamine HCl 1000 MG Tabs Take 1,000 mg by mouth 2 (two) times daily.   ibuprofen 800 MG tablet Commonly known as: ADVIL Take 1 tablet (800 mg total) by mouth every 8 (eight) hours as needed.   levothyroxine 100 MCG tablet Commonly known as: SYNTHROID Take 1 tablet (100 mcg total) by mouth daily.   lisinopril 5 MG tablet Commonly known as: ZESTRIL Take 1 tablet (5 mg total) by mouth daily.   loratadine 10 MG tablet Commonly known as: CLARITIN Take 10 mg by mouth daily as needed for  allergies.   MULTIVITAMIN PO Take 1 tablet by mouth daily.   zinc gluconate 50 MG tablet Take 50 mg by mouth daily as needed.        Follow-up: No follow-ups on file.  Claretta Fraise, M.D.

## 2020-12-08 LAB — CBC WITH DIFFERENTIAL/PLATELET
Basophils Absolute: 0 10*3/uL (ref 0.0–0.2)
Basos: 1 %
EOS (ABSOLUTE): 0.1 10*3/uL (ref 0.0–0.4)
Eos: 2 %
Hematocrit: 36.8 % (ref 34.0–46.6)
Hemoglobin: 12.2 g/dL (ref 11.1–15.9)
Immature Grans (Abs): 0 10*3/uL (ref 0.0–0.1)
Immature Granulocytes: 0 %
Lymphocytes Absolute: 1.4 10*3/uL (ref 0.7–3.1)
Lymphs: 22 %
MCH: 31 pg (ref 26.6–33.0)
MCHC: 33.2 g/dL (ref 31.5–35.7)
MCV: 94 fL (ref 79–97)
Monocytes Absolute: 0.4 10*3/uL (ref 0.1–0.9)
Monocytes: 7 %
Neutrophils Absolute: 4.1 10*3/uL (ref 1.4–7.0)
Neutrophils: 68 %
Platelets: 228 10*3/uL (ref 150–450)
RBC: 3.93 x10E6/uL (ref 3.77–5.28)
RDW: 13.2 % (ref 11.7–15.4)
WBC: 6 10*3/uL (ref 3.4–10.8)

## 2020-12-08 LAB — LIPID PANEL
Chol/HDL Ratio: 3.1 ratio (ref 0.0–4.4)
Cholesterol, Total: 211 mg/dL — ABNORMAL HIGH (ref 100–199)
HDL: 68 mg/dL (ref >39)
LDL Chol Calc (NIH): 135 mg/dL — ABNORMAL HIGH (ref 0–99)
Triglycerides: 47 mg/dL (ref 0–149)
VLDL Cholesterol Cal: 8 mg/dL (ref 5–40)

## 2020-12-08 LAB — CMP14+EGFR
ALT: 19 IU/L (ref 0–32)
AST: 17 IU/L (ref 0–40)
Albumin/Globulin Ratio: 1.5 (ref 1.2–2.2)
Albumin: 4.3 g/dL (ref 3.8–4.8)
Alkaline Phosphatase: 101 IU/L (ref 44–121)
BUN/Creatinine Ratio: 24 (ref 12–28)
BUN: 22 mg/dL (ref 8–27)
Bilirubin Total: 0.3 mg/dL (ref 0.0–1.2)
CO2: 22 mmol/L (ref 20–29)
Calcium: 9.5 mg/dL (ref 8.7–10.3)
Chloride: 104 mmol/L (ref 96–106)
Creatinine, Ser: 0.9 mg/dL (ref 0.57–1.00)
GFR calc Af Amer: 79 mL/min/{1.73_m2} (ref >59)
GFR calc non Af Amer: 68 mL/min/{1.73_m2} (ref >59)
Globulin, Total: 2.8 g/dL (ref 1.5–4.5)
Glucose: 90 mg/dL (ref 65–99)
Potassium: 5.5 mmol/L — ABNORMAL HIGH (ref 3.5–5.2)
Sodium: 140 mmol/L (ref 134–144)
Total Protein: 7.1 g/dL (ref 6.0–8.5)

## 2020-12-08 LAB — VITAMIN D 25 HYDROXY (VIT D DEFICIENCY, FRACTURES): Vit D, 25-Hydroxy: 49.4 ng/mL (ref 30.0–100.0)

## 2020-12-10 MED FILL — LISINOPRIL 5 MG TABS: 5 | 90 days supply | Qty: 90 | Fill #0

## 2020-12-10 NOTE — Progress Notes (Signed)
Patient is calling back on her lab results

## 2020-12-28 ENCOUNTER — Other Ambulatory Visit (INDEPENDENT_AMBULATORY_CARE_PROVIDER_SITE_OTHER): Payer: 59

## 2020-12-28 ENCOUNTER — Other Ambulatory Visit: Payer: Self-pay

## 2020-12-28 DIAGNOSIS — E038 Other specified hypothyroidism: Secondary | ICD-10-CM | POA: Diagnosis not present

## 2020-12-28 DIAGNOSIS — E890 Postprocedural hypothyroidism: Secondary | ICD-10-CM

## 2020-12-28 LAB — T4, FREE: Free T4: 1.98 ng/dL — ABNORMAL HIGH (ref 0.60–1.60)

## 2020-12-28 LAB — TSH: TSH: 0.4 u[IU]/mL (ref 0.35–4.50)

## 2021-01-04 ENCOUNTER — Other Ambulatory Visit: Payer: 59

## 2021-01-04 ENCOUNTER — Encounter: Payer: Self-pay | Admitting: Endocrinology

## 2021-01-04 ENCOUNTER — Other Ambulatory Visit: Payer: Self-pay

## 2021-01-04 ENCOUNTER — Ambulatory Visit: Payer: 59 | Admitting: Endocrinology

## 2021-01-04 VITALS — BP 138/80 | HR 53 | Resp 18 | Ht 63.0 in | Wt 185.6 lb

## 2021-01-04 DIAGNOSIS — E038 Other specified hypothyroidism: Secondary | ICD-10-CM | POA: Diagnosis not present

## 2021-01-04 DIAGNOSIS — E890 Postprocedural hypothyroidism: Secondary | ICD-10-CM

## 2021-01-04 NOTE — Progress Notes (Signed)
Patient ID: Andrea Castro, female   DOB: [birth year], 65 y.o.   MRN: [id number]                                                                                                              Reason for Appointment: Follow-up of thyroid   Chief complaint: Follow-up   History of Present Illness:   History at baseline: At onset she had weight loss of 43 pounds She thinks this was from changing her diet and generally exercising more However she also feels like she has had shakiness of her entire body and also a feeling of fast heart rate She is starting to get hot flashes and generally feeling hot more than usual recently However she does not think she has any fatigue Her arms may be a little weaker than usual and she is not able to do as much physical work with her arms She does not think she is feeling more tired  Thyroid levels were checked in 12/20 and were abnormal as below  Recent history:  She had I-131 treatment on 02/03/2020 with 17 mCi for Graves' disease  Her thyroid levels were normal after the treatment but she became significantly hypothyroid on her visit in 8/21 She was also symptomatic with significant fatigue and weight gain  However however subsequently has had significant inconsistency in her levothyroxine requirement More recently she had become increasingly hypothyroid  Most recent dosages now 100 mcg levothyroxine, taking 7-1/2 tablets a week  With increasing the dose slightly on her last visit she has had better energy level Weight has leveled off No cold intolerance  She is very consistent with taking her levothyroxine before breakfast  Labs: TSH is back to normal at 0.4 However free T4 is high and she appears to be taking biotin  Wt Readings from Last 3 Encounters:  01/04/21 185 lb 9.6 oz (84.2 kg)  12/07/20 186 lb 8 oz (84.6 kg)  11/15/20 187 lb 3.2 oz (84.9 kg)      Thyroid function tests as follows:     Lab Results  Component Value Date   TSH  0.40 12/28/2020   TSH 11.57 (H) 11/12/2020   TSH 5.34 (H) 09/07/2020   FREET4 1.98 (H) 12/28/2020   FREET4 1.14 11/12/2020   FREET4 1.19 09/07/2020    Free thyroxine index >12.1 with total T4 21.6 as of 10/11/2019  Lab Results  Component Value Date   THYROTRECAB 36.20 (H) 11/07/2019   THYROTRECAB 30.30 (H) 10/17/2019     Allergies as of 01/04/2021   No Known Allergies     Medication List       Accurate as of January 04, 2021  2:09 PM. If you have any questions, ask your nurse or doctor.        aspirin EC 81 MG tablet Take 1 tablet (81 mg total) by mouth daily.   Biotin 2500 MCG Caps Take 5,000 mcg by mouth daily.   calcium-vitamin D 500-200 MG-UNIT tablet Commonly known as: OSCAL WITH D Take 1 tablet  by mouth 2 (two) times daily.   ferrous sulfate 325 (65 FE) MG tablet Take 325 mg by mouth daily as needed.   fish oil-omega-3 fatty acids 1000 MG capsule Take 1 g by mouth 2 (two) times daily.   Glucosamine HCl 1000 MG Tabs Take 1,000 mg by mouth 2 (two) times daily.   ibuprofen 800 MG tablet Commonly known as: ADVIL Take 1 tablet (800 mg total) by mouth every 8 (eight) hours as needed.   levothyroxine 100 MCG tablet Commonly known as: SYNTHROID Take 1 tablet (100 mcg total) by mouth daily.   lisinopril 5 MG tablet Commonly known as: ZESTRIL Take 1 tablet (5 mg total) by mouth daily.   loratadine 10 MG tablet Commonly known as: CLARITIN Take 10 mg by mouth daily as needed for allergies.   MULTIVITAMIN PO Take 1 tablet by mouth daily.   zinc gluconate 50 MG tablet Take 50 mg by mouth daily as needed.           Past Medical History:  Diagnosis Date  . ACL (anterior cruciate ligament) tear 2002  . Arthritis   . Atypical nevus 05/27/1996   Right Rim Ear-Slight  . Cancer [facility]) 2011   left breast  . Complication of anesthesia   . History of breast cancer   . Hyphema of right eye   . PONV (postoperative nausea and vomiting)   . Ruptured disc,  thoracic 08/1983    Past Surgical History:  Procedure Laterality Date  . BACK SURGERY  1984   ruptured disc  . CYSTOCELE REPAIR N/A 09/01/2019   Procedure: ANTERIOR REPAIR (CYSTOCELE);  Surgeon: Newton Pigg, MD;  Location: [facility];  Service: Gynecology;  Laterality: N/A;  . HERNIA REPAIR  02/20/11   ventral hernia  . KNEE ARTHROSCOPY Left 08/17/2015   Procedure: Left Knee Arthroscopy and Debridement;  Surgeon: Newt Minion, MD;  Location: [place];  Service: Orthopedics;  Laterality: Left;  . KNEE ARTHROSCOPY WITH ANTERIOR CRUCIATE LIGAMENT (ACL) REPAIR Left 03/2010  . MASTECTOMY  10/30/10   right, total  . MASTECTOMY  05/22/10   left cancer/chemo and radiation  . nipple construction  08/2011   tattoo at another date  . PORT-A-CATH REMOVAL  01/09/2012   Procedure: MINOR REMOVAL PORT-A-CATH;  Surgeon: Haywood Lasso, MD;  Location: [place];  Service: General;  Laterality: N/A;  . PORTACATH PLACEMENT  12/17/09  . PUBOVAGINAL SLING N/A 09/01/2019   Procedure: Gaynelle Arabian;  Surgeon: Bjorn Loser, MD;  Location: [facility];  Service: Urology;  Laterality: N/A;  . RECONSTRUCTION BREAST W/ TRAM FLAP  02/20/11   bilateral  . RECTOCELE REPAIR N/A 09/01/2019   Procedure: POSSIBLE POSTERIOR REPAIR (RECTOCELE);  Surgeon: Newton Pigg, MD;  Location: [facility];  Service: Gynecology;  Laterality: N/A;  possible  . torn ligament  2002   left knee  . TUBAL LIGATION  2004  . VAGINAL HYSTERECTOMY N/A 09/01/2019   Procedure: HYSTERECTOMY VAGINAL;  Surgeon: Newton Pigg, MD;  Location: [facility];  Service: Gynecology;  Laterality: N/A;    Family History  Problem Relation Age of Onset  . COPD Father   . Hypertension Father   . Hypertension Mother   . Parkinson's disease Mother     Social History:  reports that she has never smoked. She has never used smokeless tobacco. She reports that she does  not drink alcohol and does not use drugs.  Allergies: No Known Allergies  Review of Systems   Has history of hypertension managed by PCP   Examination:   BP 138/80 (BP Location: Right Arm, Patient Position: Sitting, Cuff Size: Normal)   Pulse (!) 53   Resp 18   Ht 5\' 3"  (1.6 m)   Wt 185 lb 9.6 oz (84.2 kg)   SpO2 96%   BMI 32.88 kg/m       Assessment/Plan:  Post ablative hypothyroidism:  She became hypothyroid about 4 months after her I-131 treatment  Her thyroid levels have fluctuated significantly since after the treatment More recently has required gradually increasing doses Now with taking 100 mcg of levothyroxine, 7-1/2 tablets a week her thyroid levels are back to normal However TSH is low normal at 0.4  Subjectively she is doing very well  Plan: Continue 100 mcg of levothyroxine but leave off the extra half a tablet on Sundays  Follow-up in 4 months   Elayne Snare 01/04/2021, 2:09 PM    Note: This office note was prepared with Dragon voice recognition system technology. Any transcriptional errors that result from this process are unintentional.

## 2021-01-04 NOTE — Patient Instructions (Addendum)
Take  1 pill daily  No b vitamin prior to labs

## 2021-01-11 ENCOUNTER — Ambulatory Visit: Payer: 59 | Admitting: Endocrinology

## 2021-01-17 NOTE — Progress Notes (Signed)
Cardiology Office Note:    Date:  01/18/2021   ID:  Andrea Castro, DOB 04-25-1956, MRN [id number]  PCP:  Claretta Fraise, MD  Cardiologist:  Elouise Munroe, MD  Electrophysiologist:  None   Referring MD: Claretta Fraise, MD   Chief Complaint/Reason for Referral: HTN, hyperthyroidism  History of Present Illness:    Andrea Castro is a 65 y.o. female with a history of left breast upper outer quadrant and left axillary needle core biopsy on 11/30/2009, both positive for a clinical T3 N1-2, stage IIIA invasive ductal carcinoma, estrogen receptor negative, progesterone receptor negativestatus post chemotherapy and radiation, with a regimen including docetaxel, carboplatin and trastuzumab x 6 cycles from 12/28/2009 through 04/19/2010 with Neulasta support.  Trastuzumab was continued to complete one year (to 12/27/2010).  She is status post bilateral mastectomy, and completed oral anastrozole therapy. She also has a history of hyperthyroidism, and underwent ablative therapy with I-131 on 01/25/20.    I have been following her in the setting of hypertension initially felt to be primarily contributed to by hyperthyroid.  Since ablation she has had some laboratory abnormalities, but subsequently has documented hypothyroidism and has been placed on Synthroid.   BP today is mildly elevated but she has not yet taken her lisinopril.  Dr. Dwyane Dee her endocrinologist has asked her to space her lisinopril from her levothyroxine given some need for fine tuning of her TSH.  Sometimes she is having trouble remembering the lisinopril due to busy work schedule and 10-hour days.  We discussed strategies to remember to take lisinopril at work or take it at night so long as she does not have dizziness in the morning.  Recent TSH low normal. Synthroid adjusted by Dr. Dwyane Dee in endo.  She is looking forward to a girls trip with her 6 sisters this summer in June to [place] in [place].  Cholesterol is mildly  elevated compared to values from December 2020.  She attributes this to changes in her diet and not exercising as much over the winter.  She is not currently on lipid-lowering therapy.  Her prior LDL was 67 which is at goal.  Triglycerides are normal.  We discussed that she can likely have improvement in her lipids with diet and exercise modification.  This will likely be successful given previously normal LDL.  If she has persistently elevated LDL, we can consider coronary calcium scoring to risk ratified.  We reviewed a CT angio chest performed in 2012 together today.  There is 1 pixel of calcium in the proximal left circumflex artery.  This suggests that she may have some coronary calcifications if we repeat the study, now that it is 10 years later.  We will use this to restratify if needed to make decisions about statin therapy in the future.  She has had no chest pain, shortness of breath, palpitations.  No PND orthopnea leg swelling.  No syncope or presyncope.  No fever, chills, nausea, vomiting.   Past Medical History:  Diagnosis Date  . ACL (anterior cruciate ligament) tear 2002  . Arthritis   . Atypical nevus 05/27/1996   Right Rim Ear-Slight  . Cancer [facility]) 2011   left breast  . Complication of anesthesia   . History of breast cancer   . Hyphema of right eye   . PONV (postoperative nausea and vomiting)   . Ruptured disc, thoracic 08/1983    Past Surgical History:  Procedure Laterality Date  . BACK SURGERY  1984   ruptured  disc  . CYSTOCELE REPAIR N/A 09/01/2019   Procedure: ANTERIOR REPAIR (CYSTOCELE);  Surgeon: Newton Pigg, MD;  Location: [facility];  Service: Gynecology;  Laterality: N/A;  . HERNIA REPAIR  02/20/11   ventral hernia  . KNEE ARTHROSCOPY Left 08/17/2015   Procedure: Left Knee Arthroscopy and Debridement;  Surgeon: Newt Minion, MD;  Location: [place];  Service: Orthopedics;  Laterality: Left;  . KNEE ARTHROSCOPY WITH ANTERIOR CRUCIATE LIGAMENT  (ACL) REPAIR Left 03/2010  . MASTECTOMY  10/30/10   right, total  . MASTECTOMY  05/22/10   left cancer/chemo and radiation  . nipple construction  08/2011   tattoo at another date  . PORT-A-CATH REMOVAL  01/09/2012   Procedure: MINOR REMOVAL PORT-A-CATH;  Surgeon: Haywood Lasso, MD;  Location: [place];  Service: General;  Laterality: N/A;  . PORTACATH PLACEMENT  12/17/09  . PUBOVAGINAL SLING N/A 09/01/2019   Procedure: Gaynelle Arabian;  Surgeon: Bjorn Loser, MD;  Location: [facility];  Service: Urology;  Laterality: N/A;  . RECONSTRUCTION BREAST W/ TRAM FLAP  02/20/11   bilateral  . RECTOCELE REPAIR N/A 09/01/2019   Procedure: POSSIBLE POSTERIOR REPAIR (RECTOCELE);  Surgeon: Newton Pigg, MD;  Location: [facility];  Service: Gynecology;  Laterality: N/A;  possible  . torn ligament  2002   left knee  . TUBAL LIGATION  2004  . VAGINAL HYSTERECTOMY N/A 09/01/2019   Procedure: HYSTERECTOMY VAGINAL;  Surgeon: Newton Pigg, MD;  Location: [facility];  Service: Gynecology;  Laterality: N/A;    Current Medications: Current Meds  Medication Sig  . aspirin EC 81 MG tablet Take 1 tablet (81 mg total) by mouth daily.  . calcium-vitamin D (OSCAL WITH D) 500-200 MG-UNIT per tablet Take 1 tablet by mouth 2 (two) times daily.   . ferrous sulfate 325 (65 FE) MG tablet Take 325 mg by mouth daily as needed.   . fish oil-omega-3 fatty acids 1000 MG capsule Take 1 g by mouth 2 (two) times daily.  . Glucosamine HCl 1000 MG TABS Take 1,000 mg by mouth 2 (two) times daily.  Marland Kitchen ibuprofen (ADVIL) 800 MG tablet Take 1 tablet (800 mg total) by mouth every 8 (eight) hours as needed.  Marland Kitchen levothyroxine (SYNTHROID) 100 MCG tablet Take 1 tablet (100 mcg total) by mouth daily.  Marland Kitchen lisinopril (ZESTRIL) 5 MG tablet Take 1 tablet (5 mg total) by mouth daily.  Marland Kitchen loratadine (CLARITIN) 10 MG tablet Take 10 mg by mouth daily as needed for allergies.   . Multiple Vitamin (MULTIVITAMIN PO) Take 1 tablet by mouth daily.  Marland Kitchen zinc gluconate 50 MG tablet Take 50 mg by mouth daily as needed.      Allergies:   Patient has no known allergies.   Social History   Tobacco Use  . Smoking status: Never Smoker  . Smokeless tobacco: Never Used  Vaping Use  . Vaping Use: Never used  Substance Use Topics  . Alcohol use: No  . Drug use: No     Family History: The patient's family history includes COPD in her father; Hypertension in her father and mother; Parkinson's disease in her mother.  ROS:   Please see the history of present illness.    All other systems reviewed and are negative.  EKGs/Labs/Other Studies Reviewed:    The following studies were reviewed today:  EKG: Normal sinus rhythm, rate 65  I have independently reviewed the images from CT angio chest from 2012.  Recent Labs: 12/07/2020: ALT 19; BUN 22; Creatinine, Ser 0.90; Hemoglobin 12.2; Platelets 228; Potassium 5.5; Sodium 140 12/28/2020: TSH 0.40  Recent Lipid Panel    Component Value Date/Time   CHOL 211 (H) 12/07/2020 1051   TRIG 47 12/07/2020 1051   HDL 68 12/07/2020 1051   CHOLHDL 3.1 12/07/2020 1051   LDLCALC 135 (H) 12/07/2020 1051    Physical Exam:    VS:  BP (!) 150/88 (BP Location: Right Arm, Patient Position: Sitting, Cuff Size: Normal)   Pulse 65   Ht 5' 3.75" (1.619 m)   Wt 187 lb 3.2 oz (84.9 kg)   SpO2 95%   BMI 32.39 kg/m     Wt Readings from Last 5 Encounters:  01/18/21 187 lb 3.2 oz (84.9 kg)  01/04/21 185 lb 9.6 oz (84.2 kg)  12/07/20 186 lb 8 oz (84.6 kg)  11/15/20 187 lb 3.2 oz (84.9 kg)  09/12/20 181 lb 9.6 oz (82.4 kg)    Constitutional: No acute distress Eyes: sclera non-icteric, normal conjunctiva and lids ENMT: normal dentition, moist mucous membranes Cardiovascular: regular rhythm, normal rate, no murmurs. S1 and S2 normal. Radial pulses normal bilaterally. No jugular venous distention.  Respiratory: clear to auscultation  bilaterally GI : normal bowel sounds, soft and nontender. No distention.   MSK: extremities warm, well perfused. No edema.  NEURO: grossly nonfocal exam, moves all extremities. PSYCH: alert and oriented x 3, normal mood and affect.   ASSESSMENT:    1. Essential hypertension   2. History of breast cancer   3. Pure hypercholesterolemia    PLAN:    Essential hypertension - Plan: EKG 12-Lead -Continue lisinopril at current dose.  Blood pressure is elevated today but not representative of blood pressures on medication therapy.  Overall well controlled at home, will improve with continued diet lifestyle modification.  History of breast cancer-normal strain on echocardiogram performed 2021.  Pure hypercholesterolemia-elevated LDL today which is a change from her prior.  We discussed rechecking in 6 months at her next PCP visit.  If cholesterol still elevated after diet lifestyle modification.  Consider coronary calcium study for further risk stratification.    Total time of encounter: 30 minutes total time of encounter, including 20 minutes spent in face-to-face patient care on the date of this encounter. This time includes coordination of care and counseling regarding above mentioned problem list. Remainder of non-face-to-face time involved reviewing chart documents/testing relevant to the patient encounter and documentation in the medical record. I have independently reviewed documentation from referring provider.   Cherlynn Kaiser, MD, [facility]    Medication Adjustments/Labs and Tests Ordered: Current medicines are reviewed at length with the patient today.  Concerns regarding medicines are outlined above.   Orders Placed This Encounter  Procedures  . EKG 12-Lead    No orders of the defined types were placed in this encounter.   Patient Instructions  Medication Instructions:  No Changes In Medications at this time.  *If you need a refill on your  cardiac medications before your next appointment, please call your pharmacy*  Follow-Up: At [facility], you and your health needs are our priority.  As part of our continuing mission to provide you with exceptional heart care, we have created designated Provider Care Teams.  These Care Teams include your primary Cardiologist (physician) and Advanced Practice Providers (APPs -  Physician Assistants and Nurse Practitioners) who all work together to provide you with the care you need, when you need  it.  Your next appointment:   6 month(s)  The format for your next appointment:   In Person  Provider:   Cherlynn Kaiser, MD

## 2021-01-18 ENCOUNTER — Encounter: Payer: Self-pay | Admitting: Internal Medicine

## 2021-01-18 ENCOUNTER — Other Ambulatory Visit: Payer: Self-pay

## 2021-01-18 ENCOUNTER — Ambulatory Visit: Payer: 59 | Admitting: Internal Medicine

## 2021-01-18 VITALS — BP 150/88 | HR 65 | Ht 63.75 in | Wt 187.2 lb

## 2021-01-18 DIAGNOSIS — Z853 Personal history of malignant neoplasm of breast: Secondary | ICD-10-CM | POA: Diagnosis not present

## 2021-01-18 DIAGNOSIS — E7800 Pure hypercholesterolemia, unspecified: Secondary | ICD-10-CM | POA: Diagnosis not present

## 2021-01-18 DIAGNOSIS — I10 Essential (primary) hypertension: Secondary | ICD-10-CM | POA: Diagnosis not present

## 2021-01-18 NOTE — Patient Instructions (Signed)

## 2021-02-14 ENCOUNTER — Other Ambulatory Visit (HOSPITAL_COMMUNITY): Payer: Self-pay

## 2021-02-14 MED FILL — Levothyroxine Sodium Tab 100 MCG: ORAL | 90 days supply | Qty: 90 | Fill #0 | Status: AC

## 2021-03-31 ENCOUNTER — Other Ambulatory Visit: Payer: Self-pay

## 2021-03-31 ENCOUNTER — Ambulatory Visit (HOSPITAL_COMMUNITY)
Admission: EM | Admit: 2021-03-31 | Discharge: 2021-03-31 | Disposition: A | Discharge status: Home / Self Care | Payer: 59 | Attending: Medical Oncology | Admitting: Medical Oncology

## 2021-03-31 DIAGNOSIS — J069 Acute upper respiratory infection, unspecified: Secondary | ICD-10-CM

## 2021-03-31 MED ORDER — FLUTICASONE PROPIONATE 50 MCG/ACT NA SUSP: 2.0000 | Freq: Every day | NASAL | 0 refills | Status: DC

## 2021-03-31 MED ORDER — ALBUTEROL SULFATE HFA 108 (90 BASE) MCG/ACT IN AERS: 1.0000 | INHALATION_SPRAY | Freq: Four times a day (QID) | RESPIRATORY_TRACT | 0 refills | Status: DC | PRN

## 2021-03-31 MED ORDER — BENZONATATE 100 MG PO CAPS: 100.0000 mg | ORAL_CAPSULE | Freq: Three times a day (TID) | ORAL | 0 refills | Status: DC

## 2021-03-31 MED ORDER — PREDNISONE 10 MG (21) PO TBPK: ORAL_TABLET | Freq: Every day | ORAL | 0 refills | Status: DC

## 2021-03-31 NOTE — ED Triage Notes (Signed)
Pt reports a Neg Home COVID test yesterday and on Thursday. Pt reports productive cough  mucous green in color.

## 2021-03-31 NOTE — ED Provider Notes (Signed)
[place]    CSN: [id number] Arrival date & time: 03/31/21  1005      History   Chief Complaint Chief Complaint  Patient presents with   Cough    HPI TISHA CLINE is a 65 y.o. female.   HPI  Cough: Patient reports that she has had a cough since Wednesday.  On Saturday became more bronchitis-like.  She states that the cough reminds her of previous bronchitis episodes.  She has had a few episodes of productivity of the cough without hemoptysis.  She is not having any significant fevers, chest pain, shortness of breath or wheezing.  She has tried Sudafed which is helped a little bit with her nasal congestion that is coinciding with the cough.  No known sick contacts.  She has had 2 negative home COVID test.  No recent hospitalizations or procedures.   Past Medical History:  Diagnosis Date   ACL (anterior cruciate ligament) tear 2002   Arthritis    Atypical nevus 05/27/1996   Right Rim Ear-Slight   Cancer [facility]) 2011   left breast   Complication of anesthesia    History of breast cancer    Hyphema of right eye    PONV (postoperative nausea and vomiting)    Ruptured disc, thoracic 08/1983    Patient Active Problem List   Diagnosis Date Noted   Thyrotoxicosis w/o crisis 10/19/2019   Low TSH level 10/17/2019   BMI 27.0-27.9,adult 09/28/2019   Essential hypertension 09/28/2019   History of breast cancer 09/28/2019   S/P vaginal hysterectomy 09/01/2019   History of breast reconstruction 07/28/2011   Malignant neoplasm of breast ([place]) 07/18/2011   Malignant neoplasm of upper-outer quadrant of left breast in female, estrogen receptor positive ([place]) 07/15/2011    Past Surgical History:  Procedure Laterality Date   BACK SURGERY  1984   ruptured disc   CYSTOCELE REPAIR N/A 09/01/2019   Procedure: ANTERIOR REPAIR (CYSTOCELE);  Surgeon: Newton Pigg, MD;  Location: [facility];  Service: Gynecology;  Laterality: N/A;   HERNIA REPAIR  02/20/11    ventral hernia   KNEE ARTHROSCOPY Left 08/17/2015   Procedure: Left Knee Arthroscopy and Debridement;  Surgeon: Newt Minion, MD;  Location: [place];  Service: Orthopedics;  Laterality: Left;   KNEE ARTHROSCOPY WITH ANTERIOR CRUCIATE LIGAMENT (ACL) REPAIR Left 03/2010   MASTECTOMY  10/30/10   right, total   MASTECTOMY  05/22/10   left cancer/chemo and radiation   nipple construction  08/2011   tattoo at another date   PORT-A-CATH REMOVAL  01/09/2012   Procedure: [place];  Surgeon: Haywood Lasso, MD;  Location: [place];  Service: General;  Laterality: N/A;   PORTACATH PLACEMENT  12/17/09   PUBOVAGINAL SLING N/A 09/01/2019   Procedure: Gaynelle Arabian;  Surgeon: Bjorn Loser, MD;  Location: [facility];  Service: Urology;  Laterality: N/A;   RECONSTRUCTION BREAST W/ TRAM FLAP  02/20/11   bilateral   RECTOCELE REPAIR N/A 09/01/2019   Procedure: POSSIBLE POSTERIOR REPAIR (RECTOCELE);  Surgeon: Newton Pigg, MD;  Location: [facility];  Service: Gynecology;  Laterality: N/A;  possible   torn ligament  2002   left knee   TUBAL LIGATION  2004   VAGINAL HYSTERECTOMY N/A 09/01/2019   Procedure: HYSTERECTOMY VAGINAL;  Surgeon: Newton Pigg, MD;  Location: [facility];  Service: Gynecology;  Laterality: N/A;    OB History   No obstetric history on file.  Home Medications    Prior to Admission medications   Medication Sig Start Date End Date Taking? Authorizing Provider  albuterol (VENTOLIN HFA) 108 (90 Base) MCG/ACT inhaler Inhale 1-2 puffs into the lungs every 6 (six) hours as needed for wheezing or shortness of breath. 03/31/21  Yes Josian Lanese M, PA-C  benzonatate (TESSALON) 100 MG capsule Take 1 capsule (100 mg total) by mouth every 8 (eight) hours. 03/31/21  Yes Remiel Corti M, PA-C  fluticasone [facility]) 50 MCG/ACT nasal spray Place 2 sprays into both nostrils daily. 03/31/21  Yes  Bindu Docter M, PA-C  predniSONE (STERAPRED UNI-PAK 21 TAB) 10 MG (21) TBPK tablet Take by mouth daily. Take 6 tabs by mouth daily  for 2 days, then 5 tabs for 2 days, then 4 tabs for 2 days, then 3 tabs for 2 days, 2 tabs for 2 days, then 1 tab by mouth daily for 2 days 03/31/21  Yes Hughie Closs, PA-C  aspirin EC 81 MG tablet Take 1 tablet (81 mg total) by mouth daily. 08/17/15   Newt Minion, MD  calcium-vitamin D (OSCAL WITH D) 500-200 MG-UNIT per tablet Take 1 tablet by mouth 2 (two) times daily.     [provider]  ferrous sulfate 325 (65 FE) MG tablet Take 325 mg by mouth daily as needed.     [provider]  fish oil-omega-3 fatty acids 1000 MG capsule Take 1 g by mouth 2 (two) times daily.    [provider]  Glucosamine HCl 1000 MG TABS Take 1,000 mg by mouth 2 (two) times daily.    [provider]  ibuprofen (ADVIL) 800 MG tablet Take 1 tablet (800 mg total) by mouth every 8 (eight) hours as needed. 09/02/19   Newton Pigg, MD  levothyroxine (SYNTHROID) 100 MCG tablet TAKE 1 TABLET (100 MCG TOTAL) BY MOUTH DAILY. 11/15/20 11/15/21  Elayne Snare, MD  lisinopril (ZESTRIL) 5 MG tablet TAKE 1 TABLET (5 MG TOTAL) BY MOUTH DAILY. 12/07/20 12/07/21  Claretta Fraise, MD  lisinopril (ZESTRIL) 5 MG tablet TAKE 1 TABLET BY MOUTH EVERY DAY 11/16/20 11/16/21  Baruch Gouty, FNP  loratadine (CLARITIN) 10 MG tablet Take 10 mg by mouth daily as needed for allergies.    [provider]  Multiple Vitamin (MULTIVITAMIN PO) Take 1 tablet by mouth daily.    [provider]  zinc gluconate 50 MG tablet Take 50 mg by mouth daily as needed.     [provider]    Family History Family History  Problem Relation Age of Onset   COPD Father    Hypertension Father    Hypertension Mother    Parkinson's disease Mother     Social History Social History   Tobacco Use   Smoking status: Never   Smokeless tobacco: Never  Vaping Use   Vaping  Use: Never used  Substance Use Topics   Alcohol use: No   Drug use: No     Allergies   Patient has no known allergies.   Review of Systems Review of Systems  As stated above in HPI Physical Exam Triage Vital Signs ED Triage Vitals  Enc Vitals Group     BP 03/31/21 1034 127/69     Pulse Rate 03/31/21 1034 89     Resp 03/31/21 1034 18     Temp 03/31/21 1034 99 F (37.2 C)     Temp Source 03/31/21 1034 Oral     SpO2 03/31/21 1034 95 %  Weight --      Height --      Head Circumference --      Peak Flow --      Pain Score 03/31/21 1031 0     Pain Loc --      Pain Edu? --      Excl. in [place]? --    No data found.  Updated Vital Signs BP 127/69   Pulse 89   Temp 99 F (37.2 C) (Oral)   Resp 18   SpO2 95%   Physical Exam Vitals and nursing note reviewed.  Constitutional:      General: She is not in acute distress.    Appearance: Normal appearance. She is not ill-appearing, toxic-appearing or diaphoretic.  HENT:     Head: Normocephalic and atraumatic.     Right Ear: Tympanic membrane, ear canal and external ear normal.     Left Ear: Tympanic membrane, ear canal and external ear normal.     Nose: Congestion and rhinorrhea (clear) present.     Mouth/Throat:     Mouth: Mucous membranes are moist.  Eyes:     Extraocular Movements: Extraocular movements intact.     Pupils: Pupils are equal, round, and reactive to light.  Cardiovascular:     Rate and Rhythm: Normal rate and regular rhythm.     Heart sounds: Normal heart sounds.  Pulmonary:     Effort: Pulmonary effort is normal.     Breath sounds: Normal breath sounds.  Musculoskeletal:     Cervical back: Neck supple.  Lymphadenopathy:     Cervical: No cervical adenopathy.  Skin:    General: Skin is warm.  Neurological:     Mental Status: She is alert and oriented to person, place, and time.  Psychiatric:        Mood and Affect: Mood normal.        Behavior: Behavior normal.     UC Treatments / Results   Labs (all labs ordered are listed, but only abnormal results are displayed) Labs Reviewed - No data to display  EKG   Radiology No results found.  Procedures Procedures (including critical care time)  Medications Ordered in UC Medications - No data to display  Initial Impression / Assessment and Plan / UC Course  I have reviewed the triage vital signs and the nursing notes.  Pertinent labs & imaging results that were available during my care of the patient were reviewed by me and considered in my medical decision making (see chart for details).     New.  We discussed more conservative management versus more aggressive management.  She would like more aggressive management as she is going out of town in a few days on vacation.  We are going to start her on prednisone, Tessalon, Flonase and albuterol inhaler.  We reviewed how to use these medications along with common potential side effects and precautions.  We also discussed red flag signs and symptoms.  We discussed that a chest x-ray is not indicated at this time however should she have any fevers or worsening symptoms this would be my recommendation. Final Clinical Impressions(s) / UC Diagnoses   Final diagnoses:  URI with cough and congestion   Discharge Instructions   None    ED Prescriptions     Medication Sig Dispense Auth. Provider   albuterol (VENTOLIN HFA) 108 (90 Base) MCG/ACT inhaler Inhale 1-2 puffs into the lungs every 6 (six) hours as needed for wheezing or shortness of breath.  1 each Lusine Corlett M, PA-C   predniSONE (STERAPRED UNI-PAK 21 TAB) 10 MG (21) TBPK tablet Take by mouth daily. Take 6 tabs by mouth daily  for 2 days, then 5 tabs for 2 days, then 4 tabs for 2 days, then 3 tabs for 2 days, 2 tabs for 2 days, then 1 tab by mouth daily for 2 days 42 tablet Naphtali Riede M, PA-C   benzonatate (TESSALON) 100 MG capsule Take 1 capsule (100 mg total) by mouth every 8 (eight) hours. 21 capsule Aya Geisel,  Deion Forgue M, PA-C   fluticasone [facility]) 50 MCG/ACT nasal spray Place 2 sprays into both nostrils daily. 16 mL Hughie Closs, [place]      PDMP not reviewed this encounter.   Hughie Closs, [place] 03/31/21 1057

## 2021-04-20 MED FILL — Lisinopril Tab 5 MG: ORAL | 90 days supply | Qty: 90 | Fill #0 | Status: AC

## 2021-04-23 ENCOUNTER — Other Ambulatory Visit (HOSPITAL_COMMUNITY): Payer: Self-pay

## 2021-05-01 ENCOUNTER — Other Ambulatory Visit: Payer: 59

## 2021-05-03 ENCOUNTER — Other Ambulatory Visit: Payer: 59

## 2021-05-10 ENCOUNTER — Ambulatory Visit: Payer: 59 | Admitting: Endocrinology

## 2021-05-11 ENCOUNTER — Telehealth: Payer: 59 | Admitting: Nurse Practitioner

## 2021-05-11 ENCOUNTER — Encounter: Payer: Self-pay | Admitting: Nurse Practitioner

## 2021-05-11 DIAGNOSIS — U071 COVID-19: Secondary | ICD-10-CM

## 2021-05-11 MED ORDER — MOLNUPIRAVIR EUA 200MG CAPSULE: 4.0000 | ORAL_CAPSULE | Freq: Two times a day (BID) | ORAL | 0 refills | Status: AC

## 2021-05-11 NOTE — Patient Instructions (Signed)
You are being prescribed MOLNUPIRAVIR for COVID-19 infection.      Please [facility] your [facility]: [facility] [place]   Please call the pharmacy or go through the drive through vs going inside if you are picking up the mediation yourself to prevent further spread. If prescribed to a [facility] affiliated pharmacy, a pharmacist will bring the medication out to your car.   ADMINISTRATION INSTRUCTIONS: Take with or without food. Swallow the tablets whole. Don't chew, crush, or break the medications because it might not work as well  For each dose of the medication, you should be taking FOUR tablets at one time, TWICE a day   Finish your full five-day course of Molnupiravir even if you feel better before you're done. Stopping this medication too early can make it less effective to prevent severe illness related to [place].    Molnupiravir is prescribed for YOU ONLY. Don't share it with others, even if they have similar symptoms as you. This medication might not be right for everyone.   Make sure to take steps to protect yourself and others while you're taking this medication in order to get well soon and to prevent others from getting sick with COVID-19.   **If you are of childbearing potential (any gender) - it is advised to not get pregnant while taking this medication and recommended that condoms are used for female partners the next 3 months after taking the medication out of extreme caution    COMMON SIDE EFFECTS: Diarrhea Nausea  Dizziness    If your COVID-19 symptoms get worse, get medical help right away. Call 911 if you experience symptoms such as worsening cough, trouble breathing, chest pain that doesn't go away, confusion, a hard time staying awake, and pale or blue-colored skin. This medication won't prevent all COVID-19 cases from getting worse.

## 2021-05-11 NOTE — Progress Notes (Signed)
I see that you tested positive for covid. You will need to do a video visit in order to get the antiviral that you need. Go back to your my chart and click on virtual urgent care video visit, and follow directions.

## 2021-05-11 NOTE — Progress Notes (Signed)
Virtual Visit Consent   Andrea Castro, you are scheduled for a virtual visit with Andrea Castro, [place], a [facility] provider, today.     Just as with appointments in the office, your consent must be obtained to participate.  Your consent will be active for this visit and any virtual visit you may have with one of our providers in the next 365 days.     If you have a MyChart account, a copy of this consent can be sent to you electronically.  All virtual visits are billed to your insurance company just like a traditional visit in the office.    As this is a virtual visit, video technology does not allow for your provider to perform a traditional examination.  This may limit your provider's ability to fully assess your condition.  If your provider identifies any concerns that need to be evaluated in person or the need to arrange testing (such as labs, EKG, etc.), we will make arrangements to do so.     Although advances in technology are sophisticated, we cannot ensure that it will always work on either your end or our end.  If the connection with a video visit is poor, the visit may have to be switched to a telephone visit.  With either a video or telephone visit, we are not always able to ensure that we have a secure connection.     I need to obtain your verbal consent now.   Are you willing to proceed with your visit today? YES   Andrea Castro has provided verbal consent on 05/11/2021 for a virtual visit (video or telephone).   Andrea Hassell Done, FNP   Date: 05/11/2021 1:28 PM   Virtual Visit via Video Note   I, Andrea Castro, [name], 01-03-1956) on 05/11/21 at  1:45 PM EDT by a video-enabled telemedicine application and verified that I am speaking with the correct person using two identifiers.  Location: Patient: Virtual Visit Location Patient: Home Provider: Virtual Visit Location Provider: Mobile   I discussed the limitations  of evaluation and management by telemedicine and the availability of in person appointments. The patient expressed understanding and agreed to proceed.    History of Present Illness: Andrea Castro is a 65 y.o. who identifies as a female who was assigned female at birth, and is being seen today for covid positive.  Andrea Castro developed slight cough and sore thorat with congestion yesterday. Sore throat has worsened today and covid test was positive.   Review of Systems  Constitutional:  Negative for chills, fever and malaise/fatigue.  HENT:  Positive for sore throat.   Respiratory:  Positive for cough. Negative for sputum production and shortness of breath.   Musculoskeletal:  Positive for myalgias.  Neurological:  Negative for dizziness and headaches.   Problems:  Patient Active Problem List   Diagnosis Date Noted   Thyrotoxicosis w/o crisis 10/19/2019   Low TSH level 10/17/2019   BMI 27.0-27.9,adult 09/28/2019   Essential hypertension 09/28/2019   History of breast cancer 09/28/2019   S/P vaginal hysterectomy 09/01/2019   History of breast reconstruction 07/28/2011   Malignant neoplasm of breast ([place]) 07/18/2011   Malignant neoplasm of upper-outer quadrant of left breast in female, estrogen receptor positive ([place]) 07/15/2011    Allergies: No Known Allergies Medications:  Current Outpatient Medications:    albuterol (VENTOLIN HFA) 108 (90 Base) MCG/ACT inhaler, Inhale 1-2 puffs into the lungs every 6 (six) hours as needed  for wheezing or shortness of breath., Disp: 1 each, Rfl: 0   aspirin EC 81 MG tablet, Take 1 tablet (81 mg total) by mouth daily., Disp: 30 tablet, Rfl: 1   benzonatate (TESSALON) 100 MG capsule, Take 1 capsule (100 mg total) by mouth every 8 (eight) hours., Disp: 21 capsule, Rfl: 0   calcium-vitamin D (OSCAL WITH D) 500-200 MG-UNIT per tablet, Take 1 tablet by mouth 2 (two) times daily. , Disp: , Rfl:    ferrous sulfate 325 (65 FE) MG tablet, Take 325 mg by  mouth daily as needed. , Disp: , Rfl:    fish oil-omega-3 fatty acids 1000 MG capsule, Take 1 g by mouth 2 (two) times daily., Disp: , Rfl:    fluticasone (FLONASE) 50 MCG/ACT nasal spray, Place 2 sprays into both nostrils daily., Disp: 16 mL, Rfl: 0   Glucosamine HCl 1000 MG TABS, Take 1,000 mg by mouth 2 (two) times daily., Disp: , Rfl:    ibuprofen (ADVIL) 800 MG tablet, Take 1 tablet (800 mg total) by mouth every 8 (eight) hours as needed., Disp: 20 tablet, Rfl: 1   levothyroxine (SYNTHROID) 100 MCG tablet, TAKE 1 TABLET (100 MCG TOTAL) BY MOUTH DAILY., Disp: 90 tablet, Rfl: 3   lisinopril (ZESTRIL) 5 MG tablet, TAKE 1 TABLET (5 MG TOTAL) BY MOUTH DAILY., Disp: 90 tablet, Rfl: 1   lisinopril (ZESTRIL) 5 MG tablet, TAKE 1 TABLET BY MOUTH EVERY DAY, Disp: 30 tablet, Rfl: 0   loratadine (CLARITIN) 10 MG tablet, Take 10 mg by mouth daily as needed for allergies., Disp: , Rfl:    Multiple Vitamin (MULTIVITAMIN PO), Take 1 tablet by mouth daily., Disp: , Rfl:    predniSONE (STERAPRED UNI-PAK 21 TAB) 10 MG (21) TBPK tablet, Take by mouth daily. Take 6 tabs by mouth daily  for 2 days, then 5 tabs for 2 days, then 4 tabs for 2 days, then 3 tabs for 2 days, 2 tabs for 2 days, then 1 tab by mouth daily for 2 days, Disp: 42 tablet, Rfl: 0   zinc gluconate 50 MG tablet, Take 50 mg by mouth daily as needed. , Disp: , Rfl:   Observations/Objective: Patient is well-developed, well-nourished in no acute distress.  Resting comfortably  at home.  Head is normocephalic, atraumatic.  No labored breathing. Deep cough Speech is clear and coherent with logical content.  Patient is alert and oriented at baseline.  Voice hoarse.  Assessment and Plan:  Andrea Castro in today with chief complaint of No chief complaint on file.   1. Lab test positive for detection of COVID-19 virus 1. Take meds as prescribed 2. Use a cool mist humidifier especially during the winter months and when heat has been humid. 3. Use  saline nose sprays frequently 4. Saline irrigations of the nose can be very helpful if Castro frequently.  * 4X daily for 1 week*  * Use of a nettie pot can be helpful with this. Follow directions with this* 5. Drink plenty of fluids 6. Keep thermostat turn down low 7.For any cough or congestion  tessalonperles   * Children- consult with Pharmacist for dosing 8. For fever or aces or pains- take tylenol or ibuprofen appropriate for age and weight.  * for fevers greater than 101 orally you may alternate ibuprofen and tylenol every  3 hours.   Meds ordered this encounter  Medications   molnupiravir EUA 200 mg CAPS    Sig: Take 4 capsules (800 mg total)  by mouth 2 (two) times daily for 5 days.    Dispense:  40 capsule    Refill:  0    Order Specific Question:   Supervising Provider    Answer:   Noemi Chapel [[id number]]       Follow Up Instructions: I discussed the assessment and treatment plan with the patient. The patient was provided an opportunity to ask questions and all were answered. The patient agreed with the plan and demonstrated an understanding of the instructions.  A copy of instructions were sent to the patient via MyChart.  The patient was advised to call back or seek an in-person evaluation if the symptoms worsen or if the condition fails to improve as anticipated.  Time:  I spent 10 minutes with the patient via telehealth technology discussing the above problems/concerns.    Andrea Hassell Done, FNP

## 2021-05-16 ENCOUNTER — Other Ambulatory Visit (HOSPITAL_COMMUNITY): Payer: Self-pay

## 2021-05-16 MED FILL — Levothyroxine Sodium Tab 100 MCG: ORAL | 90 days supply | Qty: 90 | Fill #1 | Status: AC

## 2021-05-24 ENCOUNTER — Ambulatory Visit: Payer: 59 | Admitting: Family Medicine

## 2021-05-24 ENCOUNTER — Other Ambulatory Visit (HOSPITAL_COMMUNITY): Payer: Self-pay

## 2021-05-24 ENCOUNTER — Encounter: Payer: Self-pay | Admitting: Family Medicine

## 2021-05-24 ENCOUNTER — Other Ambulatory Visit: Payer: Self-pay

## 2021-05-24 VITALS — BP 123/80 | HR 76 | Temp 98.7°F | Ht 63.75 in | Wt 189.6 lb

## 2021-05-24 DIAGNOSIS — I10 Essential (primary) hypertension: Secondary | ICD-10-CM

## 2021-05-24 DIAGNOSIS — E785 Hyperlipidemia, unspecified: Secondary | ICD-10-CM

## 2021-05-24 DIAGNOSIS — Z23 Encounter for immunization: Secondary | ICD-10-CM | POA: Diagnosis not present

## 2021-05-24 DIAGNOSIS — E039 Hypothyroidism, unspecified: Secondary | ICD-10-CM | POA: Diagnosis not present

## 2021-05-24 MED ORDER — LISINOPRIL 5 MG PO TABS
5.0000 mg | ORAL_TABLET | Freq: Every day | ORAL | 3 refills | Status: DC
  Filled 2021-05-24: qty 90, fill #0
  Filled 2021-08-16: qty 90, 90d supply, fill #0
  Filled 2021-11-28: qty 90, 90d supply, fill #1
  Filled 2022-03-09: qty 90, 90d supply, fill #2

## 2021-05-24 NOTE — Progress Notes (Signed)
Subjective:  Patient ID: Andrea Castro, female    DOB: [birth year]  Age: 65 y.o. MRN: [id number]  CC: Medical Management of Chronic Issues   HPI Andrea Castro presents for  follow-up of hypertension. Patient has no history of headache chest pain or shortness of breath or recent cough. Patient also denies symptoms of TIA such as focal numbness or weakness. Patient denies side effects from medication. States taking it regularly.   follow-up on  thyroid. The patient has a history of hypothyroidism for many years. It has been difficult to control. She will be seeing Dr. Dwyane Dee of endocrinology in a few weeks.  Pt. denies any change in  voice, loss of hair, heat or cold intolerance. Energy level has been adequate to good. Patient denies constipation and diarrhea. No myxedema. Medication is as noted below. Verified that pt is taking it daily on an empty stomach. Well tolerated.  For elevated cholesterol she is taking OTC fish oil.   History Andrea Castro has a past medical history of ACL (anterior cruciate ligament) tear (2002), Arthritis, Atypical nevus (05/27/1996), Cancer ([place]) (9528), Complication of anesthesia, History of breast cancer, History of breast reconstruction (07/28/2011), Hyphema of right eye, PONV (postoperative nausea and vomiting), and Ruptured disc, thoracic (08/1983).   She has a past surgical history that includes Mastectomy (10/30/10); Portacath placement (12/17/09); Back surgery (1984); torn ligament (2002); Tubal ligation (2004); Mastectomy (05/22/10); Reconstruction breast w/ tram flap (02/20/11); Hernia repair (02/20/11); Port-a-cath removal (01/09/2012); nipple construction (08/2011); Knee arthroscopy with anterior cruciate ligament (acl) repair (Left, 03/2010); Knee arthroscopy (Left, 08/17/2015); Vaginal hysterectomy (N/A, 09/01/2019); Cystocele repair (N/A, 09/01/2019); Rectocele repair (N/A, 09/01/2019); and Pubovaginal sling (N/A, 09/01/2019).   Her family history includes COPD in her  father; Hypertension in her father and mother; Parkinson's disease in her mother.She reports that she has never smoked. She has never used smokeless tobacco. She reports that she does not drink alcohol and does not use drugs.  Current Outpatient Medications on File Prior to Visit  Medication Sig Dispense Refill   albuterol (VENTOLIN HFA) 108 (90 Base) MCG/ACT inhaler Inhale 1-2 puffs into the lungs every 6 (six) hours as needed for wheezing or shortness of breath. 1 each 0   aspirin EC 81 MG tablet Take 1 tablet (81 mg total) by mouth daily. 30 tablet 1   calcium-vitamin D (OSCAL WITH D) 500-200 MG-UNIT per tablet Take 1 tablet by mouth 2 (two) times daily.      ferrous sulfate 325 (65 FE) MG tablet Take 325 mg by mouth daily as needed.      fish oil-omega-3 fatty acids 1000 MG capsule Take 1 g by mouth 2 (two) times daily.     fluticasone (FLONASE) 50 MCG/ACT nasal spray Place 2 sprays into both nostrils daily. 16 mL 0   Glucosamine HCl 1000 MG TABS Take 1,000 mg by mouth 2 (two) times daily.     ibuprofen (ADVIL) 800 MG tablet Take 1 tablet (800 mg total) by mouth every 8 (eight) hours as needed. 20 tablet 1   levothyroxine (SYNTHROID) 100 MCG tablet TAKE 1 TABLET (100 MCG TOTAL) BY MOUTH DAILY. 90 tablet 3   loratadine (CLARITIN) 10 MG tablet Take 10 mg by mouth daily as needed for allergies.     Multiple Vitamin (MULTIVITAMIN PO) Take 1 tablet by mouth daily.     zinc gluconate 50 MG tablet Take 50 mg by mouth daily as needed.      No current facility-administered medications on file  prior to visit.    ROS Review of Systems  Constitutional: Negative.   HENT: Negative.    Eyes:  Negative for visual disturbance.  Respiratory:  Negative for shortness of breath.   Cardiovascular:  Negative for chest pain.  Gastrointestinal:  Negative for abdominal pain.  Musculoskeletal:  Negative for arthralgias.   Objective:  BP 123/80   Pulse 76   Temp 98.7 F (37.1 C)   Ht 5' 3.75" (1.619 m)    Wt 189 lb 9.6 oz (86 kg)   SpO2 96%   BMI 32.80 kg/m   BP Readings from Last 3 Encounters:  05/24/21 123/80  03/31/21 127/69  01/18/21 (!) 150/88    Wt Readings from Last 3 Encounters:  05/24/21 189 lb 9.6 oz (86 kg)  01/18/21 187 lb 3.2 oz (84.9 kg)  01/04/21 185 lb 9.6 oz (84.2 kg)     Physical Exam Constitutional:      General: She is not in acute distress.    Appearance: She is well-developed.  HENT:     Head: Normocephalic and atraumatic.  Eyes:     Conjunctiva/sclera: Conjunctivae normal.     Pupils: Pupils are equal, round, and reactive to light.  Neck:     Thyroid: No thyromegaly.  Cardiovascular:     Rate and Rhythm: Normal rate and regular rhythm.     Heart sounds: Normal heart sounds. No murmur heard. Pulmonary:     Effort: Pulmonary effort is normal. No respiratory distress.     Breath sounds: Normal breath sounds. No wheezing or rales.  Abdominal:     General: Bowel sounds are normal. There is no distension.     Palpations: Abdomen is soft.     Tenderness: There is no abdominal tenderness.  Musculoskeletal:        General: Normal range of motion.     Cervical back: Normal range of motion and neck supple.  Lymphadenopathy:     Cervical: No cervical adenopathy.  Skin:    General: Skin is warm and dry.  Neurological:     Mental Status: She is alert and oriented to person, place, and time.  Psychiatric:        Behavior: Behavior normal.        Thought Content: Thought content normal.        Judgment: Judgment normal.      Assessment & Plan:   [place] was seen today for medical management of chronic issues.  Diagnoses and all orders for this visit:  Essential hypertension -     lisinopril (ZESTRIL) 5 MG tablet; Take 1 tablet (5 mg total) by mouth daily. -     CBC with Differential/Platelet -     CMP14+EGFR  Hyperlipidemia, unspecified hyperlipidemia type -     Lipid panel  Hypothyroidism, unspecified type -     TSH + free T4  Need for  shingles vaccine -     Varicella-zoster vaccine IM (Shingrix)  Allergies as of 05/24/2021   No Known Allergies      Medication List        Accurate as of May 24, 2021  3:00 PM. If you have any questions, ask your nurse or doctor.          STOP taking these medications    benzonatate 100 MG capsule Commonly known as: TESSALON Stopped by: Claretta Fraise, MD   predniSONE 10 MG (21) Tbpk tablet Commonly known as: STERAPRED UNI-PAK 21 TAB Stopped by: Claretta Fraise, MD  TAKE these medications    albuterol 108 (90 Base) MCG/ACT inhaler Commonly known as: VENTOLIN HFA Inhale 1-2 puffs into the lungs every 6 (six) hours as needed for wheezing or shortness of breath.   aspirin EC 81 MG tablet Take 1 tablet (81 mg total) by mouth daily.   calcium-vitamin D 500-200 MG-UNIT tablet Commonly known as: OSCAL WITH D Take 1 tablet by mouth 2 (two) times daily.   ferrous sulfate 325 (65 FE) MG tablet Take 325 mg by mouth daily as needed.   fish oil-omega-3 fatty acids 1000 MG capsule Take 1 g by mouth 2 (two) times daily.   fluticasone 50 MCG/ACT nasal spray Commonly known as: FLONASE Place 2 sprays into both nostrils daily.   Glucosamine HCl 1000 MG Tabs Take 1,000 mg by mouth 2 (two) times daily.   ibuprofen 800 MG tablet Commonly known as: ADVIL Take 1 tablet (800 mg total) by mouth every 8 (eight) hours as needed.   levothyroxine 100 MCG tablet Commonly known as: SYNTHROID TAKE 1 TABLET (100 MCG TOTAL) BY MOUTH DAILY.   lisinopril 5 MG tablet Commonly known as: ZESTRIL Take 1 tablet (5 mg total) by mouth daily. What changed: how much to take Changed by: Claretta Fraise, MD   loratadine 10 MG tablet Commonly known as: CLARITIN Take 10 mg by mouth daily as needed for allergies.   MULTIVITAMIN PO Take 1 tablet by mouth daily.   zinc gluconate 50 MG tablet Take 50 mg by mouth daily as needed.        Meds ordered this encounter  Medications    lisinopril (ZESTRIL) 5 MG tablet    Sig: Take 1 tablet (5 mg total) by mouth daily.    Dispense:  90 tablet    Refill:  3      Follow-up: Return in about 6 months (around 11/24/2021).  Claretta Fraise, M.D.

## 2021-05-25 LAB — CMP14+EGFR
ALT: 13 IU/L (ref 0–32)
AST: 16 IU/L (ref 0–40)
Albumin/Globulin Ratio: 1.7 (ref 1.2–2.2)
Albumin: 4.2 g/dL (ref 3.8–4.8)
Alkaline Phosphatase: 89 IU/L (ref 44–121)
BUN/Creatinine Ratio: 18 (ref 12–28)
BUN: 15 mg/dL (ref 8–27)
Bilirubin Total: 0.4 mg/dL (ref 0.0–1.2)
CO2: 23 mmol/L (ref 20–29)
Calcium: 9.8 mg/dL (ref 8.7–10.3)
Chloride: 104 mmol/L (ref 96–106)
Creatinine, Ser: 0.84 mg/dL (ref 0.57–1.00)
Globulin, Total: 2.5 g/dL (ref 1.5–4.5)
Glucose: 98 mg/dL (ref 65–99)
Potassium: 5.1 mmol/L (ref 3.5–5.2)
Sodium: 139 mmol/L (ref 134–144)
Total Protein: 6.7 g/dL (ref 6.0–8.5)
eGFR: 78 mL/min/{1.73_m2} (ref >59)

## 2021-05-25 LAB — LIPID PANEL
Chol/HDL Ratio: 3.2 ratio (ref 0.0–4.4)
Cholesterol, Total: 196 mg/dL (ref 100–199)
HDL: 61 mg/dL (ref >39)
LDL Chol Calc (NIH): 126 mg/dL — ABNORMAL HIGH (ref 0–99)
Triglycerides: 50 mg/dL (ref 0–149)
VLDL Cholesterol Cal: 9 mg/dL (ref 5–40)

## 2021-05-25 LAB — CBC WITH DIFFERENTIAL/PLATELET
Basophils Absolute: 0 10*3/uL (ref 0.0–0.2)
Basos: 1 %
EOS (ABSOLUTE): 0.1 10*3/uL (ref 0.0–0.4)
Eos: 3 %
Hematocrit: 41.6 % (ref 34.0–46.6)
Hemoglobin: 13.7 g/dL (ref 11.1–15.9)
Immature Grans (Abs): 0 10*3/uL (ref 0.0–0.1)
Immature Granulocytes: 0 %
Lymphocytes Absolute: 1.5 10*3/uL (ref 0.7–3.1)
Lymphs: 31 %
MCH: 29.3 pg (ref 26.6–33.0)
MCHC: 32.9 g/dL (ref 31.5–35.7)
MCV: 89 fL (ref 79–97)
Monocytes Absolute: 0.4 10*3/uL (ref 0.1–0.9)
Monocytes: 8 %
Neutrophils Absolute: 2.8 10*3/uL (ref 1.4–7.0)
Neutrophils: 57 %
Platelets: 214 10*3/uL (ref 150–450)
RBC: 4.67 x10E6/uL (ref 3.77–5.28)
RDW: 15 % (ref 11.7–15.4)
WBC: 4.8 10*3/uL (ref 3.4–10.8)

## 2021-05-25 LAB — TSH+FREE T4
Free T4: 2.29 ng/dL — ABNORMAL HIGH (ref 0.82–1.77)
TSH: 0.375 u[IU]/mL — ABNORMAL LOW (ref 0.450–4.500)

## 2021-05-31 ENCOUNTER — Other Ambulatory Visit: Payer: 59

## 2021-06-07 ENCOUNTER — Encounter: Payer: Self-pay | Admitting: Endocrinology

## 2021-06-07 ENCOUNTER — Other Ambulatory Visit: Payer: Self-pay

## 2021-06-07 ENCOUNTER — Ambulatory Visit: Payer: 59 | Admitting: Endocrinology

## 2021-06-07 VITALS — BP 138/92 | HR 73 | Ht 64.25 in | Wt 190.2 lb

## 2021-06-07 DIAGNOSIS — E038 Other specified hypothyroidism: Secondary | ICD-10-CM

## 2021-06-07 DIAGNOSIS — E890 Postprocedural hypothyroidism: Secondary | ICD-10-CM

## 2021-06-07 NOTE — Patient Instructions (Addendum)
Take 6 1/2 tabs thyroxine per week

## 2021-06-07 NOTE — Progress Notes (Signed)
Patient ID: Andrea Castro, female   DOB: 01-12-1956, 65 y.o.   MRN: [id number]                                                                                                              Reason for Appointment: Follow-up of thyroid   Chief complaint: Follow-up   History of Present Illness:   History at baseline: At onset she had weight loss of 43 pounds She thinks this was from changing her diet and generally exercising more However she also feels like she has had shakiness of her entire body and also a feeling of fast heart rate She is starting to get hot flashes and generally feeling hot more than usual recently However she does not think she has any fatigue Her arms may be a little weaker than usual and she is not able to do as much physical work with her arms She does not think she is feeling more tired  Thyroid levels were checked in 12/20 and were abnormal as below  Recent history:  She had I-131 treatment on 02/03/2020 with 17 mCi for Graves' disease  Her thyroid levels were normal after the treatment but she became significantly hypothyroid on her visit in 8/21 She was also symptomatic with significant fatigue and weight gain  Her levothyroxine dose has been adjusted on most of her visits subsequently  Most recent dosages now 100 mcg levothyroxine daily She has not had any difficulty remembering her levothyroxine every morning before breakfast  Recently has not had any weight change Also does not have any palpitations, shakiness, heat intolerance or fatigue  Labs: TSH is about the same as on the last visit even with reducing the dosage by half tablet weekly  However free T4 is high even though she did not take any biotin containing B vitamins  Labs this time was done from [facility] Readings from Last 3 Encounters:  06/07/21 190 lb 3.2 oz (86.3 kg)  05/24/21 189 lb 9.6 oz (86 kg)  01/18/21 187 lb 3.2 oz (84.9 kg)      Thyroid function tests as follows:      Lab Results  Component Value Date   TSH 0.375 (L) 05/24/2021   TSH 0.40 12/28/2020   TSH 11.57 (H) 11/12/2020   FREET4 2.29 (H) 05/24/2021   FREET4 1.98 (H) 12/28/2020   FREET4 1.14 11/12/2020    Free thyroxine index >12.1 with total T4 21.6 as of 10/11/2019  Lab Results  Component Value Date   THYROTRECAB 36.20 (H) 11/07/2019   THYROTRECAB 30.30 (H) 10/17/2019     Allergies as of 06/07/2021   No Known Allergies      Medication List        Accurate as of June 07, 2021  8:35 AM. If you have any questions, ask your nurse or doctor.          albuterol 108 (90 Base) MCG/ACT inhaler Commonly known as: VENTOLIN HFA Inhale 1-2 puffs into the lungs every 6 (six) hours as needed  for wheezing or shortness of breath.   aspirin EC 81 MG tablet Take 1 tablet (81 mg total) by mouth daily.   calcium-vitamin D 500-200 MG-UNIT tablet Commonly known as: OSCAL WITH D Take 1 tablet by mouth 2 (two) times daily.   ferrous sulfate 325 (65 FE) MG tablet Take 325 mg by mouth daily as needed.   fish oil-omega-3 fatty acids 1000 MG capsule Take 1 g by mouth 2 (two) times daily.   fluticasone 50 MCG/ACT nasal spray Commonly known as: FLONASE Place 2 sprays into both nostrils daily.   Glucosamine HCl 1000 MG Tabs Take 1,000 mg by mouth 2 (two) times daily.   ibuprofen 800 MG tablet Commonly known as: ADVIL Take 1 tablet (800 mg total) by mouth every 8 (eight) hours as needed.   levothyroxine 100 MCG tablet Commonly known as: SYNTHROID TAKE 1 TABLET (100 MCG TOTAL) BY MOUTH DAILY.   lisinopril 5 MG tablet Commonly known as: ZESTRIL Take 1 tablet (5 mg total) by mouth daily.   loratadine 10 MG tablet Commonly known as: CLARITIN Take 10 mg by mouth daily as needed for allergies.   MULTIVITAMIN PO Take 1 tablet by mouth daily.   zinc gluconate 50 MG tablet Take 50 mg by mouth daily as needed.            Past Medical History:  Diagnosis Date   ACL (anterior  cruciate ligament) tear 2002   Arthritis    Atypical nevus 05/27/1996   Right Rim Ear-Slight   Cancer [facility]) 2011   left breast   Complication of anesthesia    History of breast cancer    History of breast reconstruction 07/28/2011   Hyphema of right eye    PONV (postoperative nausea and vomiting)    Ruptured disc, thoracic 08/1983    Past Surgical History:  Procedure Laterality Date   BACK SURGERY  1984   ruptured disc   CYSTOCELE REPAIR N/A 09/01/2019   Procedure: ANTERIOR REPAIR (CYSTOCELE);  Surgeon: Newton Pigg, MD;  Location: [facility];  Service: Gynecology;  Laterality: N/A;   HERNIA REPAIR  02/20/11   ventral hernia   KNEE ARTHROSCOPY Left 08/17/2015   Procedure: Left Knee Arthroscopy and Debridement;  Surgeon: Newt Minion, MD;  Location: [place];  Service: Orthopedics;  Laterality: Left;   KNEE ARTHROSCOPY WITH ANTERIOR CRUCIATE LIGAMENT (ACL) REPAIR Left 03/2010   MASTECTOMY  10/30/10   right, total   MASTECTOMY  05/22/10   left cancer/chemo and radiation   nipple construction  08/2011   tattoo at another date   PORT-A-CATH REMOVAL  01/09/2012   Procedure: [place];  Surgeon: Haywood Lasso, MD;  Location: [place];  Service: General;  Laterality: N/A;   PORTACATH PLACEMENT  12/17/09   PUBOVAGINAL SLING N/A 09/01/2019   Procedure: Gaynelle Arabian;  Surgeon: Bjorn Loser, MD;  Location: [facility];  Service: Urology;  Laterality: N/A;   RECONSTRUCTION BREAST W/ TRAM FLAP  02/20/11   bilateral   RECTOCELE REPAIR N/A 09/01/2019   Procedure: POSSIBLE POSTERIOR REPAIR (RECTOCELE);  Surgeon: Newton Pigg, MD;  Location: [facility];  Service: Gynecology;  Laterality: N/A;  possible   torn ligament  2002   left knee   TUBAL LIGATION  2004   VAGINAL HYSTERECTOMY N/A 09/01/2019   Procedure: HYSTERECTOMY VAGINAL;  Surgeon: Newton Pigg, MD;  Location: [facility];   Service: Gynecology;  Laterality: N/A;    Family  History  Problem Relation Age of Onset   COPD Father    Hypertension Father    Hypertension Mother    Parkinson's disease Mother     Social History:  reports that she has never smoked. She has never used smokeless tobacco. She reports that she does not drink alcohol and does not use drugs.  Allergies: No Known Allergies   Review of Systems   Has history of hypertension managed by PCP   Examination:   BP (!) 138/92   Pulse 73   Ht 5' 4.25" (1.632 m)   Wt 190 lb 3.2 oz (86.3 kg)   SpO2 97%   BMI 32.39 kg/m   Thyroid not palpable Biceps reflexes are slightly brisk Hands not unusually warm No tremor    Assessment/Plan:  Post ablative hypothyroidism:  This is secondary to her I-131 treatment in April 2021  Her thyroid levels have leveled off and now she is taking 100 mcg of levothyroxine  Subjectively doing well with no symptoms of hyperthyroidism or hypothyroidism  However TSH is low normal again although done from a different lab  Plan: Continue 100 mcg of levothyroxine but leave off  half a tablet on Sundays  Follow-up in 6 months   Elayne Snare 06/07/2021, 8:35 AM    Note: This office note was prepared with Dragon voice recognition system technology. Any transcriptional errors that result from this process are unintentional.

## 2021-07-05 ENCOUNTER — Ambulatory Visit: Payer: 59 | Admitting: Internal Medicine

## 2021-07-05 ENCOUNTER — Encounter: Payer: Self-pay | Admitting: Internal Medicine

## 2021-07-05 ENCOUNTER — Other Ambulatory Visit: Payer: Self-pay

## 2021-07-05 VITALS — BP 126/70 | HR 81 | Resp 20 | Ht 63.0 in | Wt 189.0 lb

## 2021-07-05 DIAGNOSIS — Z853 Personal history of malignant neoplasm of breast: Secondary | ICD-10-CM | POA: Diagnosis not present

## 2021-07-05 DIAGNOSIS — I10 Essential (primary) hypertension: Secondary | ICD-10-CM | POA: Diagnosis not present

## 2021-07-05 DIAGNOSIS — E7800 Pure hypercholesterolemia, unspecified: Secondary | ICD-10-CM

## 2021-07-05 NOTE — Progress Notes (Signed)
Cardiology Office Note:    Date:  07/05/2021   ID:  [name], DOB 03/31/1956, MRN [id number]  PCP:  Claretta Fraise, MD  Cardiologist:  Elouise Munroe, MD  Electrophysiologist:  None   Referring MD: Claretta Fraise, MD   Chief Complaint/Reason for Referral: HTN, hyperthyroidism  History of Present Illness:    Andrea Castro is a 65 y.o. female with a history of left breast upper outer quadrant and left axillary needle core biopsy on 11/30/2009, both positive for a clinical T3 N1-2, stage IIIA invasive ductal carcinoma, estrogen receptor negative, progesterone receptor negativestatus post chemotherapy and radiation, with a regimen including docetaxel, carboplatin and trastuzumab x 6 cycles from 12/28/2009 through 04/19/2010 with Neulasta support.  Trastuzumab was continued to complete one year (to 12/27/2010).  She is status post bilateral mastectomy, and completed oral anastrozole therapy. She also has a history of hyperthyroidism, and underwent ablative therapy with I-131 on 01/25/20.    I have been following her in the setting of hypertension initially felt to be primarily contributed to by hyperthyroid.  Since ablation she has had some laboratory abnormalities, but subsequently has documented hypothyroidism and has been placed on Synthroid.   07/05/21: She is doing well today without chest pain, shortness of breath, palpitations.  Blood pressure has been well controlled on lisinopril 5 mg daily.  Biggest struggle lately has been abnormalities in lipids, weight gain, and inability to eat a proper lunch due to hectic work schedule.  We discussed strategies for keeping known.  She will healthy snacks in her office drawer and meal prepping ahead of time since she does not leave the office for lunch.  She plans to resume walking with her sisters as she has done before and taking exercise classes at the same time long distance.  We discussed LDL above goal of less than 70 and strategies for  management.  Recent adjustment in Synthroid dose, takes 100 mcg daily and 1 day a week takes half that dose.  Seen in close follow-up with Dr. Dwyane Dee in endocrinology.  The patient denies chest pain, chest pressure, dyspnea at rest or with exertion, palpitations, PND, orthopnea, or leg swelling. Denies cough, fever, chills. Denies nausea, vomiting. Denies syncope or presyncope. Denies dizziness or lightheadedness.   Past Medical History:  Diagnosis Date   ACL (anterior cruciate ligament) tear 2002   Arthritis    Atypical nevus 05/27/1996   Right Rim Ear-Slight   Cancer [facility]) 2011   left breast   Complication of anesthesia    History of breast cancer    History of breast reconstruction 07/28/2011   Hyphema of right eye    PONV (postoperative nausea and vomiting)    Ruptured disc, thoracic 08/1983    Past Surgical History:  Procedure Laterality Date   BACK SURGERY  1984   ruptured disc   CYSTOCELE REPAIR N/A 09/01/2019   Procedure: ANTERIOR REPAIR (CYSTOCELE);  Surgeon: Newton Pigg, MD;  Location: [facility];  Service: Gynecology;  Laterality: N/A;   HERNIA REPAIR  02/20/11   ventral hernia   KNEE ARTHROSCOPY Left 08/17/2015   Procedure: Left Knee Arthroscopy and Debridement;  Surgeon: Newt Minion, MD;  Location: [place];  Service: Orthopedics;  Laterality: Left;   KNEE ARTHROSCOPY WITH ANTERIOR CRUCIATE LIGAMENT (ACL) REPAIR Left 03/2010   MASTECTOMY  10/30/10   right, total   MASTECTOMY  05/22/10   left cancer/chemo and radiation   nipple construction  08/2011   tattoo at another  date   PORT-A-CATH REMOVAL  01/09/2012   Procedure: MINOR REMOVAL PORT-A-CATH;  Surgeon: Haywood Lasso, MD;  Location: [place];  Service: General;  Laterality: N/A;   PORTACATH PLACEMENT  12/17/09   PUBOVAGINAL SLING N/A 09/01/2019   Procedure: Gaynelle Arabian;  Surgeon: Bjorn Loser, MD;  Location: [facility];  Service: Urology;  Laterality:  N/A;   RECONSTRUCTION BREAST W/ TRAM FLAP  02/20/11   bilateral   RECTOCELE REPAIR N/A 09/01/2019   Procedure: POSSIBLE POSTERIOR REPAIR (RECTOCELE);  Surgeon: Newton Pigg, MD;  Location: [facility];  Service: Gynecology;  Laterality: N/A;  possible   torn ligament  2002   left knee   TUBAL LIGATION  2004   VAGINAL HYSTERECTOMY N/A 09/01/2019   Procedure: HYSTERECTOMY VAGINAL;  Surgeon: Newton Pigg, MD;  Location: [place];  Service: Gynecology;  Laterality: N/A;    Current Medications: Current Meds  Medication Sig   aspirin EC 81 MG tablet Take 1 tablet (81 mg total) by mouth daily.   calcium-vitamin D (OSCAL WITH D) 500-200 MG-UNIT per tablet Take 1 tablet by mouth 2 (two) times daily.    ferrous sulfate 325 (65 FE) MG tablet Take 325 mg by mouth daily as needed.    fish oil-omega-3 fatty acids 1000 MG capsule Take 1 g by mouth 2 (two) times daily.   Glucosamine HCl 1000 MG TABS Take 1,000 mg by mouth 2 (two) times daily.   ibuprofen (ADVIL) 800 MG tablet Take 1 tablet (800 mg total) by mouth every 8 (eight) hours as needed.   levothyroxine (SYNTHROID) 100 MCG tablet TAKE 1 TABLET (100 MCG TOTAL) BY MOUTH DAILY.   lisinopril (ZESTRIL) 5 MG tablet Take 1 tablet (5 mg total) by mouth daily.   loratadine (CLARITIN) 10 MG tablet Take 10 mg by mouth daily as needed for allergies.   Multiple Vitamin (MULTIVITAMIN PO) Take 1 tablet by mouth daily.   zinc gluconate 50 MG tablet Take 50 mg by mouth daily as needed.      Allergies:   Patient has no known allergies.   Social History   Tobacco Use   Smoking status: Never   Smokeless tobacco: Never  Vaping Use   Vaping Use: Never used  Substance Use Topics   Alcohol use: No   Drug use: No     Family History: The patient's family history includes COPD in her father; Hypertension in her father and mother; Parkinson's disease in her mother.  ROS:   Please see the history of present illness.     All other systems reviewed and are negative.  EKGs/Labs/Other Studies Reviewed:    The following studies were reviewed today:  EKG: Not obtained today 01/18/21: Normal sinus rhythm, rate 65  I have independently reviewed the images from CT angio chest from 2012.  Recent Labs: 05/24/2021: ALT 13; BUN 15; Creatinine, Ser 0.84; Hemoglobin 13.7; Platelets 214; Potassium 5.1; Sodium 139; TSH 0.375  Recent Lipid Panel    Component Value Date/Time   CHOL 196 05/24/2021 0929   TRIG 50 05/24/2021 0929   HDL 61 05/24/2021 0929   CHOLHDL 3.2 05/24/2021 0929   LDLCALC 126 (H) 05/24/2021 0929    Physical Exam:    VS:  BP 126/70 (BP Location: Right Arm, Patient Position: Sitting, Cuff Size: Normal)   Pulse 81   Resp 20   Ht '5\' 3"'$  (1.6 m)   Wt 189 lb (85.7 kg)   SpO2 96%  BMI 33.48 kg/m     Wt Readings from Last 5 Encounters:  07/05/21 189 lb (85.7 kg)  06/07/21 190 lb 3.2 oz (86.3 kg)  05/24/21 189 lb 9.6 oz (86 kg)  01/18/21 187 lb 3.2 oz (84.9 kg)  01/04/21 185 lb 9.6 oz (84.2 kg)    Constitutional: No acute distress Eyes: sclera non-icteric, normal conjunctiva and lids ENMT: normal dentition, moist mucous membranes Cardiovascular: regular rhythm, normal rate, no murmurs. S1 and S2 normal. Radial pulses normal bilaterally. No jugular venous distention.  Respiratory: clear to auscultation bilaterally GI : normal bowel sounds, soft and nontender. No distention.   MSK: extremities warm, well perfused. No edema.  NEURO: grossly nonfocal exam, moves all extremities. PSYCH: alert and oriented x 3, normal mood and affect.   ASSESSMENT:    1. Essential hypertension   2. History of breast cancer   3. Pure hypercholesterolemia     PLAN:    Essential hypertension -blood pressure is well controlled on lisinopril 5 mg daily, continue.  History of breast cancer-normal strain on echocardiogram performed 2021.  Pure hypercholesterolemia-elevated LDL today which is a change from her  prior.  We discussed rechecking in 6 months at her next PCP visit.  She has had difficulty following diet lately due to hectic work schedule and has not been walking as much particularly after getting COVID earlier this year.  We discussed continued lifestyle modification.  If LDL is elevated at our next follow-up, I would consider coronary calcium scoring for further risk stratification and consideration of statin therapy.  This can be coordinated by her PCP if seen prior to our next follow-up.   Total time of encounter: 30 minutes total time of encounter, including 23 minutes spent in face-to-face patient care on the date of this encounter. This time includes coordination of care and counseling regarding above mentioned problem list. Remainder of non-face-to-face time involved reviewing chart documents/testing relevant to the patient encounter and documentation in the medical record. I have independently reviewed documentation from referring provider.   Cherlynn Kaiser, MD, [facility]     Medication Adjustments/Labs and Tests Ordered: Current medicines are reviewed at length with the patient today.  Concerns regarding medicines are outlined above.   No orders of the defined types were placed in this encounter.   No orders of the defined types were placed in this encounter.   Patient Instructions  Medication Instructions:  No Changes In Medications at this time.  *If you need a refill on your cardiac medications before your next appointment, please call your pharmacy*  Follow-Up: At [facility], you and your health needs are our priority.  As part of our continuing mission to provide you with exceptional heart care, we have created designated Provider Care Teams.  These Care Teams include your primary Cardiologist (physician) and Advanced Practice Providers (APPs -  Physician Assistants and Nurse Practitioners) who all work together to provide you with the care  you need, when you need it.  Your next appointment:   1 year(s)  The format for your next appointment:   In Person  Provider:   Cherlynn Kaiser, MD  [place] refers to food and lifestyle choices that are based on the traditions of countries located on the [place]. This way of eating has been shown to help prevent certain conditions and improve outcomes for people who have chronic diseases, like kidney disease and heart disease. What are tips for following this  plan? Lifestyle [place] Axon and eat meals together with your family, when possible. Drink enough fluid to keep your urine clear or pale yellow. Be physically active every day. This includes: Aerobic exercise like running or swimming. Leisure activities like gardening, walking, or housework. Get 7-8 hours of sleep each night. If recommended by your health care provider, drink red wine in moderation. This means 1 glass a day for nonpregnant women and 2 glasses a day for men. A glass of wine equals 5 oz (150 mL). Reading food labels  Check the serving size of packaged foods. For foods such as rice and pasta, the serving size refers to the amount of cooked product, not dry. Check the total fat in packaged foods. Avoid foods that have saturated fat or trans fats. Check the ingredients list for added sugars, such as corn syrup. Shopping At the grocery store, buy most of your food from the areas near the walls of the store. This includes: Fresh fruits and vegetables (produce). Grains, beans, nuts, and seeds. Some of these may be available in unpackaged forms or large amounts (in bulk). Fresh seafood. Poultry and eggs. Low-fat dairy products. Buy whole ingredients instead of prepackaged foods. Buy fresh fruits and vegetables in-season from local farmers markets. Buy frozen fruits and vegetables in resealable bags. If you do not have access to quality fresh seafood, buy precooked frozen shrimp or  canned fish, such as tuna, salmon, or sardines. Buy small amounts of raw or cooked vegetables, salads, or olives from the deli or salad bar at your store. Stock your pantry so you always have certain foods on hand, such as olive oil, canned tuna, canned tomatoes, rice, pasta, and beans. Cooking Cook foods with extra-virgin olive oil instead of using butter or other vegetable oils. Have meat as a side dish, and have vegetables or grains as your main dish. This means having meat in small portions or adding small amounts of meat to foods like pasta or stew. Use beans or vegetables instead of meat in common dishes like chili or lasagna. Experiment with different cooking methods. Try roasting or broiling vegetables instead of steaming or sauteing them. Add frozen vegetables to soups, stews, pasta, or rice. Add nuts or seeds for added healthy fat at each meal. You can add these to yogurt, salads, or vegetable dishes. Marinate fish or vegetables using olive oil, lemon juice, garlic, and fresh herbs. Meal planning  Plan to eat 1 vegetarian meal one day each week. Try to work up to 2 vegetarian meals, if possible. Eat seafood 2 or more times a week. Have healthy snacks readily available, such as: Vegetable sticks with hummus. Greek yogurt. Fruit and nut trail mix. Eat balanced meals throughout the week. This includes: Fruit: 2-3 servings a day Vegetables: 4-5 servings a day Low-fat dairy: 2 servings a day Fish, poultry, or lean meat: 1 serving a day Beans and legumes: 2 or more servings a week Nuts and seeds: 1-2 servings a day Whole grains: 6-8 servings a day Extra-virgin olive oil: 3-4 servings a day Limit red meat and sweets to only a few servings a month What are my food choices? Mediterranean diet Recommended Grains: Whole-grain pasta. Brown rice. Bulgar wheat. Polenta. Couscous. Whole-wheat bread. [place]. Vegetables: Artichokes. Beets. Broccoli. Cabbage. Carrots. Eggplant.  Green beans. Chard. Kale. Spinach. Onions. Leeks. Peas. Squash. Tomatoes. Peppers. Radishes. Fruits: Apples. Apricots. Avocado. Berries. Bananas. Cherries. Dates. Figs. Grapes. Lemons. Melon. Oranges. Peaches. Plums. Pomegranate. Meats and other protein foods: Beans. Almonds. Sunflower seeds.  Pine nuts. Peanuts. [place]. Salmon. Scallops. Shrimp. [place]. Tilapia. Clams. Oysters. Eggs. Dairy: Low-fat milk. Cheese. Greek yogurt. Beverages: Water. Red wine. Herbal tea. Fats and oils: Extra virgin olive oil. Avocado oil. Grape seed oil. Sweets and desserts: [place] yogurt with honey. Baked apples. Poached pears. Trail mix. Seasoning and other foods: Basil. Cilantro. Coriander. Cumin. Mint. Parsley. Sage. Rosemary. Tarragon. Garlic. Oregano. Thyme. Pepper. Balsalmic vinegar. Tahini. Hummus. Tomato sauce. Olives. Mushrooms. Limit these Grains: Prepackaged pasta or rice dishes. Prepackaged cereal with added sugar. Vegetables: Deep fried potatoes (french fries). Fruits: Fruit canned in syrup. Meats and other protein foods: Beef. Pork. Lamb. Poultry with skin. Hot dogs. Berniece Salines. Dairy: Ice cream. Sour cream. Whole milk. Beverages: Juice. Sugar-sweetened soft drinks. Beer. Liquor and spirits. Fats and oils: Butter. Canola oil. Vegetable oil. Beef fat (tallow). Lard. Sweets and desserts: Cookies. Cakes. Pies. Candy. Seasoning and other foods: Mayonnaise. Premade sauces and marinades. The items listed may not be a complete list. Talk with your dietitian about what dietary choices are right for you. Summary The Mediterranean diet includes both food and lifestyle choices. Eat a variety of fresh fruits and vegetables, beans, nuts, seeds, and whole grains. Limit the amount of red meat and sweets that you eat. Talk with your health care provider about whether it is safe for you to drink red wine in moderation. This means 1 glass a day for nonpregnant women and 2 glasses a day for men. A glass of wine equals 5 oz (150  mL). This information is not intended to replace advice given to you by your health care provider. Make sure you discuss any questions you have with your health care provider. Document Revised: 06/05/2016 Document Reviewed: 05/29/2016 Elsevier Patient Education  [place].

## 2021-07-05 NOTE — Patient Instructions (Addendum)
Medication Instructions:  No Changes In Medications at this time.  *If you need a refill on your cardiac medications before your next appointment, please call your pharmacy*  Follow-Up: At [facility], you and your health needs are our priority.  As part of our continuing mission to provide you with exceptional heart care, we have created designated Provider Care Teams.  These Care Teams include your primary Cardiologist (physician) and Advanced Practice Providers (APPs -  Physician Assistants and Nurse Practitioners) who all work together to provide you with the care you need, when you need it.  Your next appointment:   1 year(s)  The format for your next appointment:   In Person  Provider:   Cherlynn Kaiser, MD  [place] refers to food and lifestyle choices that are based on the traditions of countries located on the [place]. This way of eating has been shown to help prevent certain conditions and improve outcomes for people who have chronic diseases, like kidney disease and heart disease. What are tips for following this plan? Lifestyle Cook and eat meals together with your family, when possible. Drink enough fluid to keep your urine clear or pale yellow. Be physically active every day. This includes: Aerobic exercise like running or swimming. Leisure activities like gardening, walking, or housework. Get 7-8 hours of sleep each night. If recommended by your health care provider, drink red wine in moderation. This means 1 glass a day for nonpregnant women and 2 glasses a day for men. A glass of wine equals 5 oz (150 mL). Reading food labels  Check the serving size of packaged foods. For foods such as rice and pasta, the serving size refers to the amount of cooked product, not dry. Check the total fat in packaged foods. Avoid foods that have saturated fat or trans fats. Check the ingredients list for added sugars, such as corn  syrup. Shopping At the grocery store, buy most of your food from the areas near the walls of the store. This includes: Fresh fruits and vegetables (produce). Grains, beans, nuts, and seeds. Some of these may be available in unpackaged forms or large amounts (in bulk). Fresh seafood. Poultry and eggs. Low-fat dairy products. Buy whole ingredients instead of prepackaged foods. Buy fresh fruits and vegetables in-season from local farmers markets. Buy frozen fruits and vegetables in resealable bags. If you do not have access to quality fresh seafood, buy precooked frozen shrimp or canned fish, such as tuna, salmon, or sardines. Buy small amounts of raw or cooked vegetables, salads, or olives from the deli or salad bar at your store. Stock your pantry so you always have certain foods on hand, such as olive oil, canned tuna, canned tomatoes, rice, pasta, and beans. Cooking Cook foods with extra-virgin olive oil instead of using butter or other vegetable oils. Have meat as a side dish, and have vegetables or grains as your main dish. This means having meat in small portions or adding small amounts of meat to foods like pasta or stew. Use beans or vegetables instead of meat in common dishes like chili or lasagna. Experiment with different cooking methods. Try roasting or broiling vegetables instead of steaming or sauteing them. Add frozen vegetables to soups, stews, pasta, or rice. Add nuts or seeds for added healthy fat at each meal. You can add these to yogurt, salads, or vegetable dishes. Marinate fish or vegetables using olive oil, lemon juice, garlic, and fresh herbs. Meal planning  Plan to eat  1 vegetarian meal one day each week. Try to work up to 2 vegetarian meals, if possible. Eat seafood 2 or more times a week. Have healthy snacks readily available, such as: Vegetable sticks with hummus. Greek yogurt. Fruit and nut trail mix. Eat balanced meals throughout the week. This  includes: Fruit: 2-3 servings a day Vegetables: 4-5 servings a day Low-fat dairy: 2 servings a day Fish, poultry, or lean meat: 1 serving a day Beans and legumes: 2 or more servings a week Nuts and seeds: 1-2 servings a day Whole grains: 6-8 servings a day Extra-virgin olive oil: 3-4 servings a day Limit red meat and sweets to only a few servings a month What are my food choices? Mediterranean diet Recommended Grains: Whole-grain pasta. Brown rice. Bulgar wheat. Polenta. Couscous. Whole-wheat bread. [place]. Vegetables: Artichokes. Beets. Broccoli. Cabbage. Carrots. Eggplant. Green beans. Chard. Kale. Spinach. Onions. Leeks. Peas. Squash. Tomatoes. Peppers. Radishes. Fruits: Apples. Apricots. Avocado. Berries. Bananas. Cherries. Dates. Figs. Grapes. Lemons. Melon. Oranges. Peaches. Plums. Pomegranate. Meats and other protein foods: Beans. Almonds. Sunflower seeds. Pine nuts. Peanuts. [place]. Salmon. Scallops. Shrimp. [place]. Tilapia. Clams. Oysters. Eggs. Dairy: Low-fat milk. Cheese. Greek yogurt. Beverages: Water. Red wine. Herbal tea. Fats and oils: Extra virgin olive oil. Avocado oil. Grape seed oil. Sweets and desserts: [place] yogurt with honey. Baked apples. Poached pears. Trail mix. Seasoning and other foods: Basil. Cilantro. Coriander. Cumin. Mint. Parsley. Sage. Rosemary. Tarragon. Garlic. Oregano. Thyme. Pepper. Balsalmic vinegar. Tahini. Hummus. Tomato sauce. Olives. Mushrooms. Limit these Grains: Prepackaged pasta or rice dishes. Prepackaged cereal with added sugar. Vegetables: Deep fried potatoes (french fries). Fruits: Fruit canned in syrup. Meats and other protein foods: Beef. Pork. Lamb. Poultry with skin. Hot dogs. Berniece Salines. Dairy: Ice cream. Sour cream. Whole milk. Beverages: Juice. Sugar-sweetened soft drinks. Beer. Liquor and spirits. Fats and oils: Butter. Canola oil. Vegetable oil. Beef fat (tallow). Lard. Sweets and desserts: Cookies. Cakes. Pies. Candy. Seasoning  and other foods: Mayonnaise. Premade sauces and marinades. The items listed may not be a complete list. Talk with your dietitian about what dietary choices are right for you. Summary The Mediterranean diet includes both food and lifestyle choices. Eat a variety of fresh fruits and vegetables, beans, nuts, seeds, and whole grains. Limit the amount of red meat and sweets that you eat. Talk with your health care provider about whether it is safe for you to drink red wine in moderation. This means 1 glass a day for nonpregnant women and 2 glasses a day for men. A glass of wine equals 5 oz (150 mL). This information is not intended to replace advice given to you by your health care provider. Make sure you discuss any questions you have with your health care provider. Document Revised: 06/05/2016 Document Reviewed: 05/29/2016 Elsevier Patient Education  [place].

## 2021-08-16 ENCOUNTER — Other Ambulatory Visit (HOSPITAL_COMMUNITY): Payer: Self-pay

## 2021-08-16 MED FILL — Levothyroxine Sodium Tab 100 MCG: ORAL | 90 days supply | Qty: 90 | Fill #2 | Status: AC

## 2021-10-04 ENCOUNTER — Other Ambulatory Visit: Payer: Self-pay

## 2021-10-04 ENCOUNTER — Other Ambulatory Visit (INDEPENDENT_AMBULATORY_CARE_PROVIDER_SITE_OTHER): Payer: 59

## 2021-10-04 DIAGNOSIS — E890 Postprocedural hypothyroidism: Secondary | ICD-10-CM

## 2021-10-04 LAB — T4, FREE: Free T4: 1.42 ng/dL (ref 0.60–1.60)

## 2021-10-04 LAB — TSH: TSH: 1.46 u[IU]/mL (ref 0.35–5.50)

## 2021-10-11 ENCOUNTER — Ambulatory Visit: Payer: 59 | Admitting: Endocrinology

## 2021-10-21 NOTE — Progress Notes (Signed)
Patient ID: Andrea Castro, female   DOB: Mar 11, 1956, 66 y.o.   MRN: [id number]                                                                                                              Reason for Appointment: Follow-up of thyroid   Chief complaint: Follow-up   History of Present Illness:   Recent history:  She had I-131 treatment on 02/03/2020 with 17 mCi for Graves' disease  Her thyroid levels were normal after the treatment but she became significantly hypothyroid on her visit in 05/2020 She was also symptomatic with significant fatigue and weight gain  Her levothyroxine dose has been adjusted on most of her visits subsequently  On her recent in 8/22 she was taking 100 mcg levothyroxine daily Although she was subjectively doing well her TSH was slightly low and free T4 unexpectedly high Her dosage was adjusted to 6-1/2 tablets a week and she is taking just 1/2 tablet on Sundays  She takes her medications early morning consistently on empty stomach She takes her calcium tablets separately and is not taking any B vitamins containing biotin  No complaints of unusual fatigue or unexpected weight change  Labs: TSH is back to normal as also free T4   Wt Readings from Last 3 Encounters:  10/22/21 192 lb (87.1 kg)  07/05/21 189 lb (85.7 kg)  06/07/21 190 lb 3.2 oz (86.3 kg)      Thyroid function tests as follows:     Lab Results  Component Value Date   TSH 1.46 10/04/2021   TSH 0.375 (L) 05/24/2021   TSH 0.40 12/28/2020   FREET4 1.42 10/04/2021   FREET4 2.29 (H) 05/24/2021   FREET4 1.98 (H) 12/28/2020    Free thyroxine index >12.1 with total T4 21.6 as of 10/11/2019  Lab Results  Component Value Date   THYROTRECAB 36.20 (H) 11/07/2019   THYROTRECAB 30.30 (H) 10/17/2019    History at baseline:  At onset she had weight loss of 43 pounds She thinks this was from changing her diet and generally exercising more However she also feels like she has had shakiness of  her entire body and also a feeling of fast heart rate She is starting to get hot flashes and generally feeling hot more than usual recently However she does not think she has any fatigue Her arms may be a little weaker than usual and she is not able to do as much physical work with her arms She does not think she is feeling more tired   Allergies as of 10/22/2021   No Known Allergies      Medication List        Accurate as of October 22, 2021  8:13 AM. If you have any questions, ask your nurse or doctor.          aspirin EC 81 MG tablet Take 1 tablet (81 mg total) by mouth daily.   calcium-vitamin D 500-200 MG-UNIT tablet Commonly known as: OSCAL WITH D Take 1 tablet by mouth  2 (two) times daily.   ferrous sulfate 325 (65 FE) MG tablet Take 325 mg by mouth daily as needed.   fish oil-omega-3 fatty acids 1000 MG capsule Take 1 g by mouth 2 (two) times daily.   Glucosamine HCl 1000 MG Tabs Take 1,000 mg by mouth 2 (two) times daily.   ibuprofen 800 MG tablet Commonly known as: ADVIL Take 1 tablet (800 mg total) by mouth every 8 (eight) hours as needed.   levothyroxine 100 MCG tablet Commonly known as: SYNTHROID TAKE 1 TABLET (100 MCG TOTAL) BY MOUTH DAILY.   lisinopril 5 MG tablet Commonly known as: ZESTRIL Take 1 tablet (5 mg total) by mouth daily.   loratadine 10 MG tablet Commonly known as: CLARITIN Take 10 mg by mouth daily as needed for allergies.   MULTIVITAMIN PO Take 1 tablet by mouth daily.   zinc gluconate 50 MG tablet Take 50 mg by mouth daily as needed.            Past Medical History:  Diagnosis Date   ACL (anterior cruciate ligament) tear 2002   Arthritis    Atypical nevus 05/27/1996   Right Rim Ear-Slight   Cancer [facility]) 2011   left breast   Complication of anesthesia    History of breast cancer    History of breast reconstruction 07/28/2011   Hyphema of right eye    PONV (postoperative nausea and vomiting)    Ruptured disc,  thoracic 08/1983    Past Surgical History:  Procedure Laterality Date   BACK SURGERY  1984   ruptured disc   CYSTOCELE REPAIR N/A 09/01/2019   Procedure: ANTERIOR REPAIR (CYSTOCELE);  Surgeon: Newton Pigg, MD;  Location: [facility];  Service: Gynecology;  Laterality: N/A;   HERNIA REPAIR  02/20/11   ventral hernia   KNEE ARTHROSCOPY Left 08/17/2015   Procedure: Left Knee Arthroscopy and Debridement;  Surgeon: Newt Minion, MD;  Location: [facility];  Service: Orthopedics;  Laterality: Left;   KNEE ARTHROSCOPY WITH ANTERIOR CRUCIATE LIGAMENT (ACL) REPAIR Left 03/2010   MASTECTOMY  10/30/10   right, total   MASTECTOMY  05/22/10   left cancer/chemo and radiation   nipple construction  08/2011   tattoo at another date   PORT-A-CATH REMOVAL  01/09/2012   Procedure: [place];  Surgeon: Haywood Lasso, MD;  Location: [facility];  Service: General;  Laterality: N/A;   PORTACATH PLACEMENT  12/17/09   PUBOVAGINAL SLING N/A 09/01/2019   Procedure: Gaynelle Arabian;  Surgeon: Bjorn Loser, MD;  Location: [facility];  Service: Urology;  Laterality: N/A;   RECONSTRUCTION BREAST W/ TRAM FLAP  02/20/11   bilateral   RECTOCELE REPAIR N/A 09/01/2019   Procedure: POSSIBLE POSTERIOR REPAIR (RECTOCELE);  Surgeon: Newton Pigg, MD;  Location: [facility];  Service: Gynecology;  Laterality: N/A;  possible   torn ligament  2002   left knee   TUBAL LIGATION  2004   VAGINAL HYSTERECTOMY N/A 09/01/2019   Procedure: HYSTERECTOMY VAGINAL;  Surgeon: Newton Pigg, MD;  Location: [facility];  Service: Gynecology;  Laterality: N/A;    Family History  Problem Relation Age of Onset   COPD Father    Hypertension Father    Hypertension Mother    Parkinson's disease Mother     Social History:  reports that she has never smoked. She has never used smokeless tobacco. She reports that she does not drink alcohol and  does not use  drugs.  Allergies: No Known Allergies   Review of Systems   Has history of mild hypertension managed by PCP   Examination:   BP 140/88 (BP Location: Left Arm, Patient Position: Sitting, Cuff Size: Normal)    Pulse 85    Ht 5\' 3"  (1.6 m)    Wt 192 lb (87.1 kg)    SpO2 99%    BMI 34.01 kg/m   Exam not indicated   Assessment/Plan:  Post ablative hypothyroidism:  This is secondary to her I-131 treatment in April 2021  Her thyroid levels have normalized now and she is on a regimen of 100 mcg levothyroxine, taking 6-1/2 tablets a week since 8/22 No unusual fatigue or other symptoms  The TSH is normal again and also free T4  Plan: Continue 100 mcg of levothyroxine and continue to leave off  half a tablet on Sundays Reminded her to avoid taking any calcium or multivitamins at the same time as her levothyroxine  Follow-up in 9/23   Elayne Snare 10/22/2021, 8:13 AM    Note: This office note was prepared with Dragon voice recognition system technology. Any transcriptional errors that result from this process are unintentional.

## 2021-10-22 ENCOUNTER — Ambulatory Visit: Payer: 59 | Admitting: Endocrinology

## 2021-10-22 ENCOUNTER — Encounter: Payer: Self-pay | Admitting: Endocrinology

## 2021-10-22 ENCOUNTER — Other Ambulatory Visit: Payer: Self-pay

## 2021-10-22 VITALS — BP 140/88 | HR 85 | Ht 63.0 in | Wt 192.0 lb

## 2021-10-22 DIAGNOSIS — E890 Postprocedural hypothyroidism: Secondary | ICD-10-CM

## 2021-11-18 ENCOUNTER — Ambulatory Visit: Payer: 59 | Admitting: Family Medicine

## 2021-11-18 ENCOUNTER — Encounter: Payer: Self-pay | Admitting: Family Medicine

## 2021-11-18 VITALS — BP 139/75 | HR 70 | Temp 97.6°F | Ht 63.0 in | Wt 191.0 lb

## 2021-11-18 DIAGNOSIS — Z23 Encounter for immunization: Secondary | ICD-10-CM

## 2021-11-18 DIAGNOSIS — I10 Essential (primary) hypertension: Secondary | ICD-10-CM | POA: Diagnosis not present

## 2021-11-18 DIAGNOSIS — E785 Hyperlipidemia, unspecified: Secondary | ICD-10-CM

## 2021-11-18 DIAGNOSIS — E039 Hypothyroidism, unspecified: Secondary | ICD-10-CM

## 2021-11-18 LAB — CBC WITH DIFFERENTIAL/PLATELET
Basophils Absolute: 0 10*3/uL (ref 0.0–0.2)
Basos: 1 %
EOS (ABSOLUTE): 0.1 10*3/uL (ref 0.0–0.4)
Eos: 2 %
Hematocrit: 42.2 % (ref 34.0–46.6)
Hemoglobin: 13.9 g/dL (ref 11.1–15.9)
Immature Grans (Abs): 0 10*3/uL (ref 0.0–0.1)
Immature Granulocytes: 0 %
Lymphocytes Absolute: 1.4 10*3/uL (ref 0.7–3.1)
Lymphs: 26 %
MCH: 28.3 pg (ref 26.6–33.0)
MCHC: 32.9 g/dL (ref 31.5–35.7)
MCV: 86 fL (ref 79–97)
Monocytes Absolute: 0.4 10*3/uL (ref 0.1–0.9)
Monocytes: 6 %
Neutrophils Absolute: 3.7 10*3/uL (ref 1.4–7.0)
Neutrophils: 65 %
Platelets: 221 10*3/uL (ref 150–450)
RBC: 4.91 x10E6/uL (ref 3.77–5.28)
RDW: 14.5 % (ref 11.7–15.4)
WBC: 5.6 10*3/uL (ref 3.4–10.8)

## 2021-11-18 LAB — CMP14+EGFR
ALT: 12 IU/L (ref 0–32)
AST: 14 IU/L (ref 0–40)
Albumin/Globulin Ratio: 1.6 (ref 1.2–2.2)
Albumin: 4.4 g/dL (ref 3.8–4.8)
Alkaline Phosphatase: 99 IU/L (ref 44–121)
BUN/Creatinine Ratio: 18 (ref 12–28)
BUN: 17 mg/dL (ref 8–27)
Bilirubin Total: 0.3 mg/dL (ref 0.0–1.2)
CO2: 24 mmol/L (ref 20–29)
Calcium: 9.5 mg/dL (ref 8.7–10.3)
Chloride: 110 mmol/L — ABNORMAL HIGH (ref 96–106)
Creatinine, Ser: 0.97 mg/dL (ref 0.57–1.00)
Globulin, Total: 2.8 g/dL (ref 1.5–4.5)
Glucose: 92 mg/dL (ref 70–99)
Potassium: 5.4 mmol/L — ABNORMAL HIGH (ref 3.5–5.2)
Sodium: 144 mmol/L (ref 134–144)
Total Protein: 7.2 g/dL (ref 6.0–8.5)
eGFR: 65 mL/min/{1.73_m2} (ref >59)

## 2021-11-18 LAB — LIPID PANEL
Chol/HDL Ratio: 3.7 ratio (ref 0.0–4.4)
Cholesterol, Total: 217 mg/dL — ABNORMAL HIGH (ref 100–199)
HDL: 59 mg/dL (ref >39)
LDL Chol Calc (NIH): 144 mg/dL — ABNORMAL HIGH (ref 0–99)
Triglycerides: 77 mg/dL (ref 0–149)
VLDL Cholesterol Cal: 14 mg/dL (ref 5–40)

## 2021-11-18 NOTE — Progress Notes (Signed)
Subjective:  Patient ID: Andrea Castro, female    DOB: 03/24/1956  Age: 66 y.o. MRN: [id number]  CC: Medical Management of Chronic Issues   HPI Andrea Castro presents for  follow-up of hypertension. Patient has no history of headache chest pain or shortness of breath or recent cough. Patient also denies symptoms of TIA such as focal numbness or weakness. Patient denies side effects from medication. States taking it regularly.   in for follow-up of elevated cholesterol. Doing well without complaints on current medication. Off med due to adjustments of thyroid med. But about to restart the fish oil. Currently no chest pain, shortness of breath or other cardiovascular related symptoms noted.   follow-up on  thyroid. The patient has a diagnosis  of hypothyroid . It has been stable recently. Pt. denies any change in  voice, loss of hair, heat or cold intolerance. Energy level has been adequate to good. Patient denies constipation and diarrhea. No myxedema. Medication is as noted below. Verified that pt is taking it daily on an empty stomach. Well tolerated.   History Andrea Castro has a past medical history of ACL (anterior cruciate ligament) tear (2002), Arthritis, Atypical nevus (05/27/1996), Cancer ([place]) (7494), Complication of anesthesia, History of breast cancer, History of breast reconstruction (07/28/2011), Hyphema of right eye, PONV (postoperative nausea and vomiting), and Ruptured disc, thoracic (08/1983).   She has a past surgical history that includes Mastectomy (10/30/10); Portacath placement (12/17/09); Back surgery (1984); torn ligament (2002); Tubal ligation (2004); Mastectomy (05/22/10); Reconstruction breast w/ tram flap (02/20/11); Hernia repair (02/20/11); Port-a-cath removal (01/09/2012); nipple construction (08/2011); Knee arthroscopy with anterior cruciate ligament (acl) repair (Left, 03/2010); Knee arthroscopy (Left, 08/17/2015); Vaginal hysterectomy (N/A, 09/01/2019); Cystocele repair (N/A,  09/01/2019); Rectocele repair (N/A, 09/01/2019); and Pubovaginal sling (N/A, 09/01/2019).   Her family history includes COPD in her father; Hypertension in her father and mother; Parkinson's disease in her mother.She reports that she has never smoked. She has never used smokeless tobacco. She reports that she does not drink alcohol and does not use drugs.  Current Outpatient Medications on File Prior to Visit  Medication Sig Dispense Refill   aspirin EC 81 MG tablet Take 1 tablet (81 mg total) by mouth daily. 30 tablet 1   calcium-vitamin D (OSCAL WITH D) 500-200 MG-UNIT per tablet Take 1 tablet by mouth 2 (two) times daily.      ferrous sulfate 325 (65 FE) MG tablet Take 325 mg by mouth daily as needed.      fish oil-omega-3 fatty acids 1000 MG capsule Take 1 g by mouth 2 (two) times daily.     Glucosamine HCl 1000 MG TABS Take 1,000 mg by mouth 2 (two) times daily.     ibuprofen (ADVIL) 800 MG tablet Take 1 tablet (800 mg total) by mouth every 8 (eight) hours as needed. 20 tablet 1   lisinopril (ZESTRIL) 5 MG tablet Take 1 tablet (5 mg total) by mouth daily. 90 tablet 3   loratadine (CLARITIN) 10 MG tablet Take 10 mg by mouth daily as needed for allergies.     Multiple Vitamin (MULTIVITAMIN PO) Take 1 tablet by mouth daily.     zinc gluconate 50 MG tablet Take 50 mg by mouth daily as needed.      levothyroxine (SYNTHROID) 100 MCG tablet TAKE 1 TABLET (100 MCG TOTAL) BY MOUTH DAILY. 90 tablet 3   No current facility-administered medications on file prior to visit.    ROS Review of Systems  Constitutional: Negative.   HENT: Negative.    Eyes:  Negative for visual disturbance.  Respiratory:  Negative for shortness of breath.   Cardiovascular:  Negative for chest pain.  Gastrointestinal:  Negative for abdominal pain.  Musculoskeletal:  Negative for arthralgias.   Objective:  BP 139/75    Pulse 70    Temp 97.6 F (36.4 C)    Ht 5' 3" (1.6 m)    Wt 191 lb (86.6 kg)    SpO2 96%    BMI  33.83 kg/m   BP Readings from Last 3 Encounters:  11/18/21 139/75  10/22/21 140/88  07/05/21 126/70    Wt Readings from Last 3 Encounters:  11/18/21 191 lb (86.6 kg)  10/22/21 192 lb (87.1 kg)  07/05/21 189 lb (85.7 kg)     Physical Exam Constitutional:      General: She is not in acute distress.    Appearance: She is well-developed.  Cardiovascular:     Rate and Rhythm: Normal rate and regular rhythm.  Pulmonary:     Breath sounds: Normal breath sounds.  Musculoskeletal:        General: Normal range of motion.  Skin:    General: Skin is warm and dry.  Neurological:     Mental Status: She is alert and oriented to person, place, and time.      Assessment & Plan:   Andrea Castro was seen today for medical management of chronic issues.  Diagnoses and all orders for this visit:  Essential hypertension -     CBC with Differential/Platelet -     CMP14+EGFR  Hyperlipidemia, unspecified hyperlipidemia type -     Lipid panel  Hypothyroidism, unspecified type -     Cancel: TSH + free T4   Allergies as of 11/18/2021   No Known Allergies      Medication List        Accurate as of November 18, 2021  8:39 AM. If you have any questions, ask your nurse or doctor.          aspirin EC 81 MG tablet Take 1 tablet (81 mg total) by mouth daily.   calcium-vitamin D 500-200 MG-UNIT tablet Commonly known as: OSCAL WITH D Take 1 tablet by mouth 2 (two) times daily.   ferrous sulfate 325 (65 FE) MG tablet Take 325 mg by mouth daily as needed.   fish oil-omega-3 fatty acids 1000 MG capsule Take 1 g by mouth 2 (two) times daily.   Glucosamine HCl 1000 MG Tabs Take 1,000 mg by mouth 2 (two) times daily.   ibuprofen 800 MG tablet Commonly known as: ADVIL Take 1 tablet (800 mg total) by mouth every 8 (eight) hours as needed.   levothyroxine 100 MCG tablet Commonly known as: SYNTHROID TAKE 1 TABLET (100 MCG TOTAL) BY MOUTH DAILY.   lisinopril 5 MG tablet Commonly known  as: ZESTRIL Take 1 tablet (5 mg total) by mouth daily.   loratadine 10 MG tablet Commonly known as: CLARITIN Take 10 mg by mouth daily as needed for allergies.   MULTIVITAMIN PO Take 1 tablet by mouth daily.   zinc gluconate 50 MG tablet Take 50 mg by mouth daily as needed.          Continue meds as is. Salt control recommended  Follow-up: Return in about 6 months (around 05/18/2022).  Claretta Fraise, M.D.

## 2021-11-18 NOTE — Addendum Note (Signed)
Addended by: Baldomero Lamy B on: 11/18/2021 12:03 PM   Modules accepted: Orders

## 2021-11-28 ENCOUNTER — Other Ambulatory Visit (HOSPITAL_COMMUNITY): Payer: Self-pay

## 2021-11-28 ENCOUNTER — Other Ambulatory Visit: Payer: Self-pay | Admitting: Endocrinology

## 2021-11-29 ENCOUNTER — Other Ambulatory Visit (HOSPITAL_COMMUNITY): Payer: Self-pay

## 2021-11-29 MED ORDER — LEVOTHYROXINE SODIUM 100 MCG PO TABS
100.0000 ug | ORAL_TABLET | Freq: Every day | ORAL | 3 refills | Status: DC
  Filled 2021-11-29: qty 90, 90d supply, fill #0
  Filled 2022-03-09: qty 90, 90d supply, fill #1
  Filled 2022-06-16: qty 90, 90d supply, fill #2

## 2021-12-06 ENCOUNTER — Other Ambulatory Visit (HOSPITAL_COMMUNITY): Payer: Self-pay

## 2022-03-10 ENCOUNTER — Other Ambulatory Visit (HOSPITAL_COMMUNITY): Payer: Self-pay

## 2022-03-12 ENCOUNTER — Other Ambulatory Visit (HOSPITAL_COMMUNITY): Payer: Self-pay

## 2022-04-11 DIAGNOSIS — Z130 Encounter for screening for diseases of the blood and blood-forming organs and certain disorders involving the immune mechanism: Secondary | ICD-10-CM | POA: Diagnosis not present

## 2022-04-11 DIAGNOSIS — Z01419 Encounter for gynecological examination (general) (routine) without abnormal findings: Secondary | ICD-10-CM | POA: Diagnosis not present

## 2022-04-11 DIAGNOSIS — Z1389 Encounter for screening for other disorder: Secondary | ICD-10-CM | POA: Diagnosis not present

## 2022-04-11 DIAGNOSIS — E669 Obesity, unspecified: Secondary | ICD-10-CM | POA: Diagnosis not present

## 2022-05-12 ENCOUNTER — Encounter: Payer: Self-pay | Admitting: Family Medicine

## 2022-05-12 ENCOUNTER — Ambulatory Visit: Payer: 59 | Admitting: Family Medicine

## 2022-05-12 VITALS — BP 126/78 | HR 69 | Temp 97.8°F | Ht 63.0 in | Wt 192.6 lb

## 2022-05-12 DIAGNOSIS — E785 Hyperlipidemia, unspecified: Secondary | ICD-10-CM

## 2022-05-12 DIAGNOSIS — R7989 Other specified abnormal findings of blood chemistry: Secondary | ICD-10-CM

## 2022-05-12 DIAGNOSIS — I10 Essential (primary) hypertension: Secondary | ICD-10-CM

## 2022-05-12 NOTE — Progress Notes (Signed)
Subjective:  Patient ID: Andrea Castro, female    DOB: 1956/10/14  Age: 66 y.o. MRN: [id number]  CC: Medical Management of Chronic Issues   HPI Andrea Castro presents for  follow-up of hypertension. Patient has no history of headache chest pain or shortness of breath or recent cough. Patient also denies symptoms of TIA such as focal numbness or weakness. Patient denies side effects from medication. States taking it regularly.  follow-up on  thyroid. The patient has a history of hypothyroidism for many years. It has been stable recently. Pt. denies any change in  voice, loss of hair, heat or cold intolerance. Energy level has been adequate to good. Patient denies constipation and diarrhea. No myxedema. Medication is as noted below. Verified that pt is taking it daily on an empty stomach. Well tolerated.   History Andrea Castro has a past medical history of ACL (anterior cruciate ligament) tear (2002), Arthritis, Atypical nevus (05/27/1996), Cancer ([place]) (8416), Complication of anesthesia, History of breast cancer, History of breast reconstruction (07/28/2011), Hyphema of right eye, PONV (postoperative nausea and vomiting), and Ruptured disc, thoracic (08/1983).   Andrea Castro has a past surgical history that includes Mastectomy (10/30/10); Portacath placement (12/17/09); Back surgery (1984); torn ligament (2002); Tubal ligation (2004); Mastectomy (05/22/10); Reconstruction breast w/ tram flap (02/20/11); Hernia repair (02/20/11); Port-a-cath removal (01/09/2012); nipple construction (08/2011); Knee arthroscopy with anterior cruciate ligament (acl) repair (Left, 03/2010); Knee arthroscopy (Left, 08/17/2015); Vaginal hysterectomy (N/A, 09/01/2019); Cystocele repair (N/A, 09/01/2019); Rectocele repair (N/A, 09/01/2019); and Pubovaginal sling (N/A, 09/01/2019).   Andrea Castro family history includes COPD in Andrea Castro father; Hypertension in Andrea Castro father and mother; Parkinson's disease in Andrea Castro mother.Andrea Castro reports that Andrea Castro has never smoked. Andrea Castro  has never used smokeless tobacco. Andrea Castro reports that Andrea Castro does not drink alcohol and does not use drugs.  Current Outpatient Medications on File Prior to Visit  Medication Sig Dispense Refill   aspirin EC 81 MG tablet Take 1 tablet (81 mg total) by mouth daily. 30 tablet 1   calcium-vitamin D (OSCAL WITH D) 500-200 MG-UNIT per tablet Take 1 tablet by mouth 2 (two) times daily.      ferrous sulfate 325 (65 FE) MG tablet Take 325 mg by mouth daily as needed.      fish oil-omega-3 fatty acids 1000 MG capsule Take 1 g by mouth 2 (two) times daily.     Glucosamine HCl 1000 MG TABS Take 1,000 mg by mouth 2 (two) times daily.     ibuprofen (ADVIL) 800 MG tablet Take 1 tablet (800 mg total) by mouth every 8 (eight) hours as needed. 20 tablet 1   levothyroxine (SYNTHROID) 100 MCG tablet Take 1 tablet (100 mcg total) by mouth daily. 90 tablet 3   lisinopril (ZESTRIL) 5 MG tablet Take 1 tablet (5 mg total) by mouth daily. 90 tablet 3   loratadine (CLARITIN) 10 MG tablet Take 10 mg by mouth daily as needed for allergies.     Multiple Vitamin (MULTIVITAMIN PO) Take 1 tablet by mouth daily.     zinc gluconate 50 MG tablet Take 50 mg by mouth daily as needed.      No current facility-administered medications on file prior to visit.    ROS Review of Systems  Constitutional: Negative.   HENT: Negative.    Eyes:  Negative for visual disturbance.  Respiratory:  Negative for shortness of breath.   Cardiovascular:  Negative for chest pain.  Gastrointestinal:  Negative for abdominal pain.  Musculoskeletal:  Negative for arthralgias.  Objective:  BP 126/78   Pulse 69   Temp 97.8 F (36.6 C)   Ht '5\' 3"'  (1.6 m)   Wt 192 lb 9.6 oz (87.4 kg)   SpO2 97%   BMI 34.12 kg/m   BP Readings from Last 3 Encounters:  05/12/22 126/78  11/18/21 139/75  10/22/21 140/88    Wt Readings from Last 3 Encounters:  05/12/22 192 lb 9.6 oz (87.4 kg)  11/18/21 191 lb (86.6 kg)  10/22/21 192 lb (87.1 kg)      Physical Exam Constitutional:      General: Andrea Castro is not in acute distress.    Appearance: Andrea Castro is well-developed.  Cardiovascular:     Rate and Rhythm: Normal rate and regular rhythm.  Pulmonary:     Breath sounds: Normal breath sounds.  Musculoskeletal:        General: Normal range of motion.  Skin:    General: Skin is warm and dry.  Neurological:     Mental Status: Andrea Castro is alert and oriented to person, place, and time.       Assessment & Plan:   Andrea Castro was seen today for medical management of chronic issues.  Diagnoses and all orders for this visit:  Low TSH level -     TSH + free T4  Essential hypertension -     CBC with Differential/Platelet -     CMP14+EGFR  Hyperlipidemia, unspecified hyperlipidemia type -     Lipid panel   Allergies as of 05/12/2022   No Known Allergies      Medication List        Accurate as of May 12, 2022  9:04 AM. If you have any questions, ask your nurse or doctor.          aspirin EC 81 MG tablet Take 1 tablet (81 mg total) by mouth daily.   calcium-vitamin D 500-200 MG-UNIT tablet Commonly known as: OSCAL WITH D Take 1 tablet by mouth 2 (two) times daily.   ferrous sulfate 325 (65 FE) MG tablet Take 325 mg by mouth daily as needed.   fish oil-omega-3 fatty acids 1000 MG capsule Take 1 g by mouth 2 (two) times daily.   Glucosamine HCl 1000 MG Tabs Take 1,000 mg by mouth 2 (two) times daily.   ibuprofen 800 MG tablet Commonly known as: ADVIL Take 1 tablet (800 mg total) by mouth every 8 (eight) hours as needed.   levothyroxine 100 MCG tablet Commonly known as: SYNTHROID Take 1 tablet (100 mcg total) by mouth daily.   lisinopril 5 MG tablet Commonly known as: ZESTRIL Take 1 tablet (5 mg total) by mouth daily.   loratadine 10 MG tablet Commonly known as: CLARITIN Take 10 mg by mouth daily as needed for allergies.   MULTIVITAMIN PO Take 1 tablet by mouth daily.   zinc gluconate 50 MG tablet Take 50 mg  by mouth daily as needed.        No orders of the defined types were placed in this encounter.   Continue current regimen of care. Walking regularly, encouraged to continue  Follow-up: Return in about 6 months (around 11/12/2022) for [facility] physical.  Andrea Castro, M.D.

## 2022-05-13 LAB — CBC WITH DIFFERENTIAL/PLATELET
Basophils Absolute: 0 10*3/uL (ref 0.0–0.2)
Basos: 1 %
EOS (ABSOLUTE): 0.1 10*3/uL (ref 0.0–0.4)
Eos: 2 %
Hematocrit: 41.7 % (ref 34.0–46.6)
Hemoglobin: 13.3 g/dL (ref 11.1–15.9)
Immature Grans (Abs): 0 10*3/uL (ref 0.0–0.1)
Immature Granulocytes: 0 %
Lymphocytes Absolute: 1.5 10*3/uL (ref 0.7–3.1)
Lymphs: 25 %
MCH: 27.3 pg (ref 26.6–33.0)
MCHC: 31.9 g/dL (ref 31.5–35.7)
MCV: 86 fL (ref 79–97)
Monocytes Absolute: 0.5 10*3/uL (ref 0.1–0.9)
Monocytes: 8 %
Neutrophils Absolute: 3.9 10*3/uL (ref 1.4–7.0)
Neutrophils: 64 %
Platelets: 220 10*3/uL (ref 150–450)
RBC: 4.87 x10E6/uL (ref 3.77–5.28)
RDW: 14.5 % (ref 11.7–15.4)
WBC: 6 10*3/uL (ref 3.4–10.8)

## 2022-05-13 LAB — CMP14+EGFR
ALT: 14 IU/L (ref 0–32)
AST: 17 IU/L (ref 0–40)
Albumin/Globulin Ratio: 1.4 (ref 1.2–2.2)
Albumin: 4.2 g/dL (ref 3.9–4.9)
Alkaline Phosphatase: 92 IU/L (ref 44–121)
BUN/Creatinine Ratio: 17 (ref 12–28)
BUN: 15 mg/dL (ref 8–27)
Bilirubin Total: 0.3 mg/dL (ref 0.0–1.2)
CO2: 23 mmol/L (ref 20–29)
Calcium: 9.8 mg/dL (ref 8.7–10.3)
Chloride: 102 mmol/L (ref 96–106)
Creatinine, Ser: 0.88 mg/dL (ref 0.57–1.00)
Globulin, Total: 2.9 g/dL (ref 1.5–4.5)
Glucose: 97 mg/dL (ref 70–99)
Potassium: 5.3 mmol/L — ABNORMAL HIGH (ref 3.5–5.2)
Sodium: 137 mmol/L (ref 134–144)
Total Protein: 7.1 g/dL (ref 6.0–8.5)
eGFR: 73 mL/min/{1.73_m2} (ref >59)

## 2022-05-13 LAB — TSH+FREE T4
Free T4: 1.89 ng/dL — ABNORMAL HIGH (ref 0.82–1.77)
TSH: 0.684 u[IU]/mL (ref 0.450–4.500)

## 2022-05-13 LAB — LIPID PANEL
Chol/HDL Ratio: 3.1 ratio (ref 0.0–4.4)
Cholesterol, Total: 211 mg/dL — ABNORMAL HIGH (ref 100–199)
HDL: 67 mg/dL (ref >39)
LDL Chol Calc (NIH): 133 mg/dL — ABNORMAL HIGH (ref 0–99)
Triglycerides: 59 mg/dL (ref 0–149)
VLDL Cholesterol Cal: 11 mg/dL (ref 5–40)

## 2022-06-16 ENCOUNTER — Other Ambulatory Visit: Payer: Self-pay | Admitting: Family Medicine

## 2022-06-16 ENCOUNTER — Other Ambulatory Visit (HOSPITAL_COMMUNITY): Payer: Self-pay

## 2022-06-16 DIAGNOSIS — I10 Essential (primary) hypertension: Secondary | ICD-10-CM

## 2022-06-16 MED ORDER — LISINOPRIL 5 MG PO TABS
5.0000 mg | ORAL_TABLET | Freq: Every day | ORAL | 1 refills | Status: DC
  Filled 2022-06-16: qty 90, 90d supply, fill #0
  Filled 2022-09-26: qty 90, 90d supply, fill #1

## 2022-06-20 ENCOUNTER — Other Ambulatory Visit (INDEPENDENT_AMBULATORY_CARE_PROVIDER_SITE_OTHER): Payer: 59

## 2022-06-20 DIAGNOSIS — E890 Postprocedural hypothyroidism: Secondary | ICD-10-CM | POA: Diagnosis not present

## 2022-06-20 LAB — TSH: TSH: 0.46 u[IU]/mL (ref 0.35–5.50)

## 2022-06-20 LAB — T4, FREE: Free T4: 1.39 ng/dL (ref 0.60–1.60)

## 2022-06-26 NOTE — Progress Notes (Signed)
Patient ID: Andrea Castro, female   DOB: 04-24-1956, 66 y.o.   MRN: [id number]                                                                                                              Reason for Appointment: Follow-up of thyroid   Chief complaint: Follow-up   History of Present Illness:   Recent history:  She had I-131 treatment on 02/03/2020 with 17 mCi for Graves' disease  Her thyroid levels were normal after the treatment but she became significantly hypothyroid on her visit in 05/2020 She was also symptomatic with significant fatigue and weight gain  Her levothyroxine dose has been adjusted on most of her visits subsequently  Previously in 8/22 she was taking 100 mcg levothyroxine daily Although she was subjectively doing well her TSH was slightly low and free T4 unexpectedly high Her dosage was adjusted to 6-1/2 tablets a week and she is taking just 1/2 tablet on Sundays  She takes her levothyroxine early morning consistently on empty stomach She takes her calcium tablets separately Currently not taking any B vitamins containing biotin  No complaints of unusual fatigue No palpitations or shakiness  Labs: TSH low normal compared to 12/22   Wt Readings from Last 3 Encounters:  06/27/22 193 lb (87.5 kg)  05/12/22 192 lb 9.6 oz (87.4 kg)  11/18/21 191 lb (86.6 kg)      Thyroid function tests as follows:     Lab Results  Component Value Date   TSH 0.46 06/20/2022   TSH 0.684 05/12/2022   TSH 1.46 10/04/2021   FREET4 1.39 06/20/2022   FREET4 1.89 (H) 05/12/2022   FREET4 1.42 10/04/2021    Free thyroxine index >12.1 with total T4 21.6 as of 10/11/2019  Lab Results  Component Value Date   THYROTRECAB 36.20 (H) 11/07/2019   THYROTRECAB 30.30 (H) 10/17/2019    History at baseline:  At onset she had weight loss of 43 pounds She thinks this was from changing her diet and generally exercising more However she also feels like she has had shakiness of her  entire body and also a feeling of fast heart rate She is starting to get hot flashes and generally feeling hot more than usual recently However she does not think she has any fatigue Her arms may be a little weaker than usual and she is not able to do as much physical work with her arms She does not think she is feeling more tired   Allergies as of 06/27/2022   No Known Allergies      Medication List        Accurate as of June 27, 2022  8:11 AM. If you have any questions, ask your nurse or doctor.          aspirin EC 81 MG tablet Take 1 tablet (81 mg total) by mouth daily.   calcium-vitamin D 500-200 MG-UNIT tablet Commonly known as: OSCAL WITH D Take 1 tablet by mouth 2 (two) times daily.   ferrous sulfate  325 (65 FE) MG tablet Take 325 mg by mouth daily as needed.   fish oil-omega-3 fatty acids 1000 MG capsule Take 1 g by mouth 2 (two) times daily.   Glucosamine HCl 1000 MG Tabs Take 1,000 mg by mouth 2 (two) times daily.   ibuprofen 800 MG tablet Commonly known as: ADVIL Take 1 tablet (800 mg total) by mouth every 8 (eight) hours as needed.   levothyroxine 100 MCG tablet Commonly known as: SYNTHROID Take 1 tablet (100 mcg total) by mouth daily.   lisinopril 5 MG tablet Commonly known as: ZESTRIL Take 1 tablet (5 mg total) by mouth daily.   loratadine 10 MG tablet Commonly known as: CLARITIN Take 10 mg by mouth daily as needed for allergies.   MULTIVITAMIN PO Take 1 tablet by mouth daily.   zinc gluconate 50 MG tablet Take 50 mg by mouth daily as needed.            Past Medical History:  Diagnosis Date   ACL (anterior cruciate ligament) tear 2002   Arthritis    Atypical nevus 05/27/1996   Right Rim Ear-Slight   Cancer [facility]) 2011   left breast   Complication of anesthesia    History of breast cancer    History of breast reconstruction 07/28/2011   Hyphema of right eye    PONV (postoperative nausea and vomiting)    Ruptured disc, thoracic  08/1983    Past Surgical History:  Procedure Laterality Date   BACK SURGERY  1984   ruptured disc   CYSTOCELE REPAIR N/A 09/01/2019   Procedure: ANTERIOR REPAIR (CYSTOCELE);  Surgeon: Newton Pigg, MD;  Location: [facility];  Service: Gynecology;  Laterality: N/A;   HERNIA REPAIR  02/20/11   ventral hernia   KNEE ARTHROSCOPY Left 08/17/2015   Procedure: Left Knee Arthroscopy and Debridement;  Surgeon: Newt Minion, MD;  Location: [place];  Service: Orthopedics;  Laterality: Left;   KNEE ARTHROSCOPY WITH ANTERIOR CRUCIATE LIGAMENT (ACL) REPAIR Left 03/2010   MASTECTOMY  10/30/10   right, total   MASTECTOMY  05/22/10   left cancer/chemo and radiation   nipple construction  08/2011   tattoo at another date   PORT-A-CATH REMOVAL  01/09/2012   Procedure: [place];  Surgeon: Haywood Lasso, MD;  Location: [place];  Service: General;  Laterality: N/A;   PORTACATH PLACEMENT  12/17/09   PUBOVAGINAL SLING N/A 09/01/2019   Procedure: Gaynelle Arabian;  Surgeon: Bjorn Loser, MD;  Location: [facility]  Service: Urology;  Laterality: N/A;   RECONSTRUCTION BREAST W/ TRAM FLAP  02/20/11   bilateral   RECTOCELE REPAIR N/A 09/01/2019   Procedure: POSSIBLE POSTERIOR REPAIR (RECTOCELE);  Surgeon: Newton Pigg, MD;  Location: [facility];  Service: Gynecology;  Laterality: N/A;  possible   torn ligament  2002   left knee   TUBAL LIGATION  2004   VAGINAL HYSTERECTOMY N/A 09/01/2019   Procedure: HYSTERECTOMY VAGINAL;  Surgeon: Newton Pigg, MD;  Location: [facility];  Service: Gynecology;  Laterality: N/A;    Family History  Problem Relation Age of Onset   COPD Father    Hypertension Father    Hypertension Mother    Parkinson's disease Mother     Social History:  reports that she has never smoked. She has never used smokeless tobacco. She reports that she does not drink alcohol and does not  use drugs.  Allergies: No Known Allergies  Review of Systems   Has history of mild hypertension managed by PCP   Examination:   BP (!) 142/90   Pulse 80   Ht '5\' 4"'$  (1.626 m)   Wt 193 lb (87.5 kg)   SpO2 93%   BMI 33.13 kg/m   No tremor Biceps reflexes appear normal   Assessment/Plan:  Post ablative hypothyroidism:  This is secondary to her I-131 treatment in April 2021  Her thyroid levels have been consistently now and she is on a regimen of 100 mcg levothyroxine, taking 6-1/2 tablets a week since 8/22 Subjectively doing well and no symptoms of excessive thyroid supplementation  The TSH is normal but tending to be low normal She is on the equivalent of about 93 mcg levothyroxine daily  Since she just refilled her 100 mcg dose she can stay on the same regimen of 6-1/2 tablets a day but next prescription will be for 88 mcg daily   Follow-up in 9/24   Elayne Snare 06/27/2022, 8:11 AM    Note: This office note was prepared with Dragon voice recognition system technology. Any transcriptional errors that result from this process are unintentional.

## 2022-06-27 ENCOUNTER — Encounter: Payer: Self-pay | Admitting: Endocrinology

## 2022-06-27 ENCOUNTER — Ambulatory Visit: Payer: 59 | Admitting: Endocrinology

## 2022-06-27 VITALS — BP 142/90 | HR 80 | Ht 64.0 in | Wt 193.0 lb

## 2022-06-27 DIAGNOSIS — E890 Postprocedural hypothyroidism: Secondary | ICD-10-CM

## 2022-06-27 NOTE — Patient Instructions (Signed)
Next Rx 88ug daily

## 2022-08-15 ENCOUNTER — Other Ambulatory Visit: Payer: Self-pay

## 2022-08-15 MED ORDER — COVID-19 MRNA 2023-2024 VACCINE (COMIRNATY) 0.3 ML INJECTION
0.3000 mL | Freq: Once | INTRAMUSCULAR | 0 refills | Status: AC
  Filled 2022-08-15: qty 0.3, 1d supply, fill #0

## 2022-09-23 ENCOUNTER — Encounter: Payer: Self-pay | Admitting: Endocrinology

## 2022-09-23 ENCOUNTER — Other Ambulatory Visit (HOSPITAL_COMMUNITY): Payer: Self-pay

## 2022-09-23 ENCOUNTER — Other Ambulatory Visit: Payer: Self-pay

## 2022-09-23 DIAGNOSIS — E890 Postprocedural hypothyroidism: Secondary | ICD-10-CM

## 2022-09-23 MED ORDER — LEVOTHYROXINE SODIUM 88 MCG PO TABS
88.0000 ug | ORAL_TABLET | Freq: Every day | ORAL | 3 refills | Status: DC
  Filled 2022-09-23: qty 90, 90d supply, fill #0
  Filled 2022-12-25: qty 90, 90d supply, fill #1
  Filled 2023-03-24: qty 90, 90d supply, fill #2
  Filled 2023-06-25: qty 90, 90d supply, fill #3

## 2022-11-03 ENCOUNTER — Encounter: Payer: Self-pay | Admitting: Internal Medicine

## 2022-11-24 ENCOUNTER — Encounter: Payer: 59 | Admitting: Family Medicine

## 2022-12-25 ENCOUNTER — Other Ambulatory Visit (HOSPITAL_COMMUNITY): Payer: Self-pay

## 2022-12-25 ENCOUNTER — Other Ambulatory Visit: Payer: Self-pay | Admitting: Family Medicine

## 2022-12-25 DIAGNOSIS — I10 Essential (primary) hypertension: Secondary | ICD-10-CM

## 2022-12-25 MED ORDER — LISINOPRIL 5 MG PO TABS
5.0000 mg | ORAL_TABLET | Freq: Every day | ORAL | 0 refills | Status: DC
  Filled 2022-12-25: qty 90, 90d supply, fill #0

## 2023-01-14 ENCOUNTER — Encounter: Payer: 59 | Admitting: Family Medicine

## 2023-03-24 ENCOUNTER — Other Ambulatory Visit: Payer: Self-pay | Admitting: Family Medicine

## 2023-03-24 ENCOUNTER — Other Ambulatory Visit (HOSPITAL_COMMUNITY): Payer: Self-pay

## 2023-03-24 DIAGNOSIS — I10 Essential (primary) hypertension: Secondary | ICD-10-CM

## 2023-03-24 MED ORDER — LISINOPRIL 5 MG PO TABS
5.0000 mg | ORAL_TABLET | Freq: Every day | ORAL | 0 refills | Status: DC
  Filled 2023-03-24: qty 90, 90d supply, fill #0

## 2023-03-26 ENCOUNTER — Other Ambulatory Visit (HOSPITAL_COMMUNITY): Payer: Self-pay

## 2023-04-13 ENCOUNTER — Other Ambulatory Visit (HOSPITAL_COMMUNITY): Payer: Self-pay

## 2023-04-13 ENCOUNTER — Encounter: Payer: Self-pay | Admitting: Family Medicine

## 2023-04-13 ENCOUNTER — Ambulatory Visit (INDEPENDENT_AMBULATORY_CARE_PROVIDER_SITE_OTHER): Payer: Commercial Managed Care - PPO | Admitting: Family Medicine

## 2023-04-13 VITALS — BP 138/83 | HR 66 | Temp 97.9°F | Ht 64.0 in | Wt 199.2 lb

## 2023-04-13 DIAGNOSIS — E039 Hypothyroidism, unspecified: Secondary | ICD-10-CM

## 2023-04-13 DIAGNOSIS — R0989 Other specified symptoms and signs involving the circulatory and respiratory systems: Secondary | ICD-10-CM | POA: Diagnosis not present

## 2023-04-13 DIAGNOSIS — Z0000 Encounter for general adult medical examination without abnormal findings: Secondary | ICD-10-CM

## 2023-04-13 DIAGNOSIS — I10 Essential (primary) hypertension: Secondary | ICD-10-CM

## 2023-04-13 DIAGNOSIS — E785 Hyperlipidemia, unspecified: Secondary | ICD-10-CM

## 2023-04-13 DIAGNOSIS — Z23 Encounter for immunization: Secondary | ICD-10-CM | POA: Diagnosis not present

## 2023-04-13 DIAGNOSIS — Z1211 Encounter for screening for malignant neoplasm of colon: Secondary | ICD-10-CM | POA: Diagnosis not present

## 2023-04-13 DIAGNOSIS — Z0001 Encounter for general adult medical examination with abnormal findings: Secondary | ICD-10-CM | POA: Diagnosis not present

## 2023-04-13 MED ORDER — LISINOPRIL 5 MG PO TABS
5.0000 mg | ORAL_TABLET | Freq: Every day | ORAL | 3 refills | Status: DC
  Filled 2023-04-13 – 2023-06-25 (×2): qty 90, 90d supply, fill #0
  Filled 2023-09-23: qty 90, 90d supply, fill #1
  Filled 2023-12-23: qty 90, 90d supply, fill #2
  Filled 2024-03-23: qty 90, 90d supply, fill #0

## 2023-04-13 MED ORDER — IBUPROFEN 200 MG PO TABS: 200.0000 mg | ORAL_TABLET | Freq: Three times a day (TID) | ORAL | Status: AC | PRN

## 2023-04-13 NOTE — Progress Notes (Signed)
Subjective:  Patient ID: Andrea Castro, female    DOB: February 01, 1956  Age: 67 y.o. MRN: [id number]  CC: Annual Exam   HPI Andrea Castro presents for Annual physical. Had double mastectomy.   Treated for Graves by Endocrine.  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   BP at home at rest 125-130/75-80. Planning weight loss for Cholesterol reduction.      04/13/2023    3:35 PM 05/12/2022    8:31 AM 11/18/2021    8:07 AM  Depression screen PHQ 2/9  Decreased Interest 0 0 0  Down, Depressed, Hopeless 0 0 0  PHQ - 2 Score 0 0 0    History Andrea Castro has a past medical history of ACL (anterior cruciate ligament) tear (2002), Arthritis, Atypical nevus (05/27/1996), Cancer (HCC) (2011), Complication of anesthesia, History of breast cancer, History of breast reconstruction (07/28/2011), Hyphema of right eye, PONV (postoperative nausea and vomiting), and Ruptured disc, thoracic (08/1983).   She has a past surgical history that includes Mastectomy (10/30/10); Portacath placement (12/17/09); Back surgery (1984); torn ligament (2002); Tubal ligation (2004); Mastectomy (05/22/10); Reconstruction breast w/ tram flap (02/20/11); Hernia repair (02/20/11); Port-a-cath removal (01/09/2012); nipple construction (08/2011); Knee arthroscopy with anterior cruciate ligament (acl) repair (Left, 03/2010); Knee arthroscopy (Left, 08/17/2015); Vaginal hysterectomy (N/A, 09/01/2019); Cystocele repair (N/A, 09/01/2019); Rectocele repair (N/A, 09/01/2019); and Pubovaginal sling (N/A, 09/01/2019).   Her family history includes COPD in her father; Hypertension in her father and mother; Parkinson's disease in her mother.She reports that she has never smoked. She has never used smokeless tobacco. She reports that she does not drink alcohol and does not use drugs.    ROS Review of Systems  Constitutional: Negative.  Negative for appetite change, chills, diaphoresis, fatigue, fever and unexpected weight change.  HENT:   Negative for congestion, ear pain, hearing loss, postnasal drip, rhinorrhea, sneezing, sore throat and trouble swallowing.   Eyes:  Negative for pain and visual disturbance.  Respiratory:  Negative for cough, chest tightness and shortness of breath.   Cardiovascular:  Negative for chest pain and palpitations.  Gastrointestinal:  Negative for abdominal pain, constipation, diarrhea, nausea and vomiting.  Endocrine: Negative for cold intolerance, heat intolerance, polydipsia, polyphagia and polyuria.  Genitourinary:  Negative for difficulty urinating, dysuria, frequency and menstrual problem.  Musculoskeletal:  Negative for arthralgias, joint swelling and myalgias.  Skin:  Negative for rash.  Allergic/Immunologic: Negative for environmental allergies.  Neurological:  Negative for dizziness, weakness, numbness and headaches.  Psychiatric/Behavioral:  Negative for agitation, dysphoric mood and sleep disturbance.     Objective:  BP 138/83   Pulse 66   Temp 97.9 F (36.6 C)   Ht 5\' 4"  (1.626 m)   Wt 199 lb 3.2 oz (90.4 kg)   SpO2 96%   BMI 34.19 kg/m   BP Readings from Last 3 Encounters:  04/13/23 138/83  06/27/22 (!) 142/90  05/12/22 126/78    Wt Readings from Last 3 Encounters:  04/13/23 199 lb 3.2 oz (90.4 kg)  06/27/22 193 lb (87.5 kg)  05/12/22 192 lb 9.6 oz (87.4 kg)     Physical Exam Constitutional:      General: She is not in acute distress.    Appearance: She is well-developed.  HENT:     Head: Normocephalic and atraumatic.  Eyes:     Conjunctiva/sclera: Conjunctivae normal.     Pupils: Pupils are equal, round, and reactive to light.  Neck:     Thyroid: No thyromegaly.  Vascular: Carotid bruit present.     Trachea: Trachea and phonation normal.  Cardiovascular:     Rate and Rhythm: Normal rate and regular rhythm.     Heart sounds: Normal heart sounds. No murmur heard. Pulmonary:     Effort: Pulmonary effort is normal. No respiratory distress.     Breath  sounds: Normal breath sounds. No wheezing or rales.  Abdominal:     General: Bowel sounds are normal. There is no distension.     Palpations: Abdomen is soft.     Tenderness: There is no abdominal tenderness.  Musculoskeletal:        General: Normal range of motion.     Cervical back: Normal range of motion and neck supple.  Lymphadenopathy:     Cervical: No cervical adenopathy.  Skin:    General: Skin is warm and dry.  Neurological:     Mental Status: She is alert and oriented to person, place, and time.  Psychiatric:        Behavior: Behavior normal.        Thought Content: Thought content normal.        Judgment: Judgment normal.       Assessment & Plan:   Andrea Castro was seen today for annual exam.  Diagnoses and all orders for this visit:  Well adult exam -     CBC with Differential/Platelet -     CMP14+EGFR -     Lipid panel -     TSH + free T4 -     Urinalysis  Essential hypertension -     CBC with Differential/Platelet -     CMP14+EGFR -     lisinopril (ZESTRIL) 5 MG tablet; Take 1 tablet (5 mg total) by mouth daily.  Hyperlipidemia, unspecified hyperlipidemia type -     Lipid panel  Hypothyroidism, unspecified type -     TSH + free T4  Need for pneumococcal 20-valent conjugate vaccination -     Pneumococcal conjugate vaccine 20-valent (Prevnar 20)  Screen for colon cancer -     Ambulatory referral to Gastroenterology  Bilateral carotid bruits -     US Carotid Bilateral; Future  Other orders -     ibuprofen (ADVIL) 200 MG tablet; Take 1 tablet (200 mg total) by mouth every 8 (eight) hours as needed.       I have changed Andrea Castro's ibuprofen. I am also having her maintain her Glucosamine HCl, fish oil-omega-3 fatty acids, Multiple Vitamin (MULTIVITAMIN PO), calcium-vitamin D, zinc gluconate, ferrous sulfate, loratadine, aspirin EC, levothyroxine, and lisinopril.  Allergies as of 04/13/2023   No Known Allergies      Medication List         Accurate as of April 13, 2023  6:18 PM. If you have any questions, ask your nurse or doctor.          aspirin EC 81 MG tablet Take 1 tablet (81 mg total) by mouth daily.   calcium-vitamin D 500-200 MG-UNIT tablet Commonly known as: OSCAL WITH D Take 1 tablet by mouth 2 (two) times daily.   ferrous sulfate 325 (65 FE) MG tablet Take 325 mg by mouth daily as needed.   fish oil-omega-3 fatty acids 1000 MG capsule Take 1 g by mouth 2 (two) times daily.   Glucosamine HCl 1000 MG Tabs Take 1,000 mg by mouth 2 (two) times daily.   ibuprofen 200 MG tablet Commonly known as: ADVIL Take 1 tablet (200 mg total) by mouth every 8 (  eight) hours as needed. What changed:  medication strength how much to take Changed by: Mechele Claude, MD   levothyroxine 88 MCG tablet Commonly known as: SYNTHROID Take 1 tablet (88 mcg total) by mouth daily.   lisinopril 5 MG tablet Commonly known as: ZESTRIL Take 1 tablet (5 mg total) by mouth daily.   loratadine 10 MG tablet Commonly known as: CLARITIN Take 10 mg by mouth daily as needed for allergies.   MULTIVITAMIN PO Take 1 tablet by mouth daily.   zinc gluconate 50 MG tablet Take 50 mg by mouth daily as needed.         Follow-up: Return in about 6 months (around 10/13/2023) for hypertension.  Mechele Claude, M.D.

## 2023-05-07 ENCOUNTER — Other Ambulatory Visit (INDEPENDENT_AMBULATORY_CARE_PROVIDER_SITE_OTHER): Payer: Commercial Managed Care - PPO

## 2023-05-07 DIAGNOSIS — E890 Postprocedural hypothyroidism: Secondary | ICD-10-CM

## 2023-05-07 LAB — TSH: TSH: 2.79 u[IU]/mL (ref 0.35–5.50)

## 2023-05-07 LAB — T4, FREE: Free T4: 1.26 ng/dL (ref 0.60–1.60)

## 2023-05-20 ENCOUNTER — Encounter: Payer: Self-pay | Admitting: Endocrinology

## 2023-05-20 ENCOUNTER — Ambulatory Visit: Payer: Commercial Managed Care - PPO | Admitting: Endocrinology

## 2023-05-20 VITALS — BP 128/70 | HR 65 | Ht 64.0 in | Wt 200.0 lb

## 2023-05-20 DIAGNOSIS — I10 Essential (primary) hypertension: Secondary | ICD-10-CM

## 2023-05-20 DIAGNOSIS — E7800 Pure hypercholesterolemia, unspecified: Secondary | ICD-10-CM | POA: Diagnosis not present

## 2023-05-20 DIAGNOSIS — E890 Postprocedural hypothyroidism: Secondary | ICD-10-CM | POA: Diagnosis not present

## 2023-05-20 LAB — LIPID PANEL
Cholesterol: 221 mg/dL — ABNORMAL HIGH (ref 0–200)
HDL: 63.8 mg/dL (ref >39.00)
LDL Cholesterol: 133 mg/dL — ABNORMAL HIGH (ref 0–99)
NonHDL: 157.35
Total CHOL/HDL Ratio: 3
Triglycerides: 123 mg/dL (ref 0.0–149.0)
VLDL: 24.6 mg/dL (ref 0.0–40.0)

## 2023-05-20 LAB — COMPREHENSIVE METABOLIC PANEL
ALT: 12 U/L (ref 0–35)
AST: 12 U/L (ref 0–37)
Albumin: 4.1 g/dL (ref 3.5–5.2)
Alkaline Phosphatase: 87 U/L (ref 39–117)
BUN: 18 mg/dL (ref 6–23)
CO2: 27 mEq/L (ref 19–32)
Calcium: 9.7 mg/dL (ref 8.4–10.5)
Chloride: 106 mEq/L (ref 96–112)
Creatinine, Ser: 0.92 mg/dL (ref 0.40–1.20)
GFR: 65.18 mL/min (ref >60.00)
Glucose, Bld: 86 mg/dL (ref 70–99)
Potassium: 4.6 mEq/L (ref 3.5–5.1)
Sodium: 140 mEq/L (ref 135–145)
Total Bilirubin: 0.3 mg/dL (ref 0.2–1.2)
Total Protein: 7.6 g/dL (ref 6.0–8.3)

## 2023-05-20 LAB — CBC WITH DIFFERENTIAL/PLATELET
Basophils Absolute: 0 10*3/uL (ref 0.0–0.1)
Basophils Relative: 0.6 % (ref 0.0–3.0)
Eosinophils Absolute: 0.1 10*3/uL (ref 0.0–0.7)
Eosinophils Relative: 1.7 % (ref 0.0–5.0)
HCT: 38 % (ref 36.0–46.0)
Hemoglobin: 12.2 g/dL (ref 12.0–15.0)
Lymphocytes Relative: 27.3 % (ref 12.0–46.0)
Lymphs Abs: 1.9 10*3/uL (ref 0.7–4.0)
MCHC: 32.2 g/dL (ref 30.0–36.0)
MCV: 80 fl (ref 78.0–100.0)
Monocytes Absolute: 0.6 10*3/uL (ref 0.1–1.0)
Monocytes Relative: 8.2 % (ref 3.0–12.0)
Neutro Abs: 4.3 10*3/uL (ref 1.4–7.7)
Neutrophils Relative %: 62.2 % (ref 43.0–77.0)
Platelets: 250 10*3/uL (ref 150.0–400.0)
RBC: 4.75 Mil/uL (ref 3.87–5.11)
RDW: 20.1 % — ABNORMAL HIGH (ref 11.5–15.5)
WBC: 6.8 10*3/uL (ref 4.0–10.5)

## 2023-05-20 NOTE — Progress Notes (Signed)
Patient ID: Andrea Castro, female   DOB: [birth year], 67 y.o.   MRN: [id number]                                                                                                              Reason for Appointment: Follow-up of thyroid   Chief complaint: Follow-up   History of Present Illness:   Recent history:  She had I-131 treatment on 02/03/2020 with 17 mCi for Graves' disease  Her thyroid levels were normal after the treatment but she became significantly hypothyroid on her visit in 05/2020 She was also symptomatic with significant fatigue and weight gain  Her levothyroxine dose has been adjusted on most of her visits subsequently  Previously in 8/22 she was taking 100 mcg levothyroxine daily Subsequently the dose has been adjusted and now she is taking 88 mcg since 09/2022  She takes her levothyroxine in the morning regularly on empty stomach  She takes her calcium tablets separately Currently not taking any B vitamins containing biotin  She feels fairly good, she thinks her energy level was a little better with reducing her dose slightly after her last visit in 9/23 Has gained 7 pounds since her last visit  Labs: TSH normal compared to last visit   Wt Readings from Last 3 Encounters:  05/20/23 200 lb (90.7 kg)  04/13/23 199 lb 3.2 oz (90.4 kg)  06/27/22 193 lb (87.5 kg)      Thyroid function tests as follows:     Lab Results  Component Value Date   TSH 2.79 05/07/2023   TSH 0.46 06/20/2022   TSH 0.684 05/12/2022   FREET4 1.26 05/07/2023   FREET4 1.39 06/20/2022   FREET4 1.89 (H) 05/12/2022    Free thyroxine index >12.1 with total T4 21.6 as of 10/11/2019  Lab Results  Component Value Date   THYROTRECAB 36.20 (H) 11/07/2019   THYROTRECAB 30.30 (H) 10/17/2019    History at baseline:  At onset she had weight loss of 43 pounds She thinks this was from changing her diet and generally exercising more However she also feels like she has had shakiness of  her entire body and also a feeling of fast heart rate She is starting to get hot flashes and generally feeling hot more than usual recently However she does not think she has any fatigue Her arms may be a little weaker than usual and she is not able to do as much physical work with her arms She does not think she is feeling more tired   Allergies as of 05/20/2023   No Known Allergies      Medication List        Accurate as of May 20, 2023  8:46 AM. If you have any questions, ask your nurse or doctor.          aspirin EC 81 MG tablet Take 1 tablet (81 mg total) by mouth daily.   calcium-vitamin D 500-200 MG-UNIT tablet Commonly known as: OSCAL WITH D Take 1 tablet by mouth 2 (two)  times daily.   ferrous sulfate 325 (65 FE) MG tablet Take 325 mg by mouth daily as needed.   fish oil-omega-3 fatty acids 1000 MG capsule Take 1 g by mouth 2 (two) times daily.   Glucosamine HCl 1000 MG Tabs Take 1,000 mg by mouth 2 (two) times daily.   ibuprofen 200 MG tablet Commonly known as: ADVIL Take 1 tablet (200 mg total) by mouth every 8 (eight) hours as needed.   levothyroxine 88 MCG tablet Commonly known as: SYNTHROID Take 1 tablet (88 mcg total) by mouth daily.   lisinopril 5 MG tablet Commonly known as: ZESTRIL Take 1 tablet (5 mg total) by mouth daily.   loratadine 10 MG tablet Commonly known as: CLARITIN Take 10 mg by mouth daily as needed for allergies.   MULTIVITAMIN PO Take 1 tablet by mouth daily.   zinc gluconate 50 MG tablet Take 50 mg by mouth daily as needed.            Past Medical History:  Diagnosis Date   ACL (anterior cruciate ligament) tear 2002   Arthritis    Atypical nevus 05/27/1996   Right Rim Ear-Slight   Cancer [facility]) 2011   left breast   Complication of anesthesia    History of breast cancer    History of breast reconstruction 07/28/2011   Hyphema of right eye    PONV (postoperative nausea and vomiting)    Ruptured disc, thoracic  08/1983    Past Surgical History:  Procedure Laterality Date   BACK SURGERY  1984   ruptured disc   CYSTOCELE REPAIR N/A 09/01/2019   Procedure: ANTERIOR REPAIR (CYSTOCELE);  Surgeon: Tracey Harries, MD;  Location: [facility];  Service: Gynecology;  Laterality: N/A;   HERNIA REPAIR  02/20/11   ventral hernia   KNEE ARTHROSCOPY Left 08/17/2015   Procedure: Left Knee Arthroscopy and Debridement;  Surgeon: Nadara Mustard, MD;  [facility];  Service: Orthopedics;  Laterality: Left;   KNEE ARTHROSCOPY WITH ANTERIOR CRUCIATE LIGAMENT (ACL) REPAIR Left 03/2010   MASTECTOMY  10/30/10   right, total   MASTECTOMY  05/22/10   left cancer/chemo and radiation   nipple construction  08/2011   tattoo at another date   PORT-A-CATH REMOVAL  01/09/2012   Procedure: MINOR REMOVAL PORT-A-CATH;  Surgeon: Currie Paris, MD;  Location: [facility];  Service: General;  Laterality: N/A;   PORTACATH PLACEMENT  12/17/09   PUBOVAGINAL SLING N/A 09/01/2019   Procedure: Leonides Grills;  Surgeon: Alfredo Martinez, MD;  Location: [facility];  Service: Urology;  Laterality: N/A;   RECONSTRUCTION BREAST W/ TRAM FLAP  02/20/11   bilateral   RECTOCELE REPAIR N/A 09/01/2019   Procedure: POSSIBLE POSTERIOR REPAIR (RECTOCELE);  Surgeon: Tracey Harries, MD;  Location: [facility];  Service: Gynecology;  Laterality: N/A;  possible   torn ligament  2002   left knee   TUBAL LIGATION  2004   VAGINAL HYSTERECTOMY N/A 09/01/2019   Procedure: HYSTERECTOMY VAGINAL;  Surgeon: Tracey Harries, MD;  Location: [facility];  Service: Gynecology;  Laterality: N/A;    Family History  Problem Relation Age of Onset   COPD Father    Hypertension Father    Hypertension Mother    Parkinson's disease Mother     Social History:  reports that she has never smoked. She has never used smokeless tobacco. She reports that she does not drink alcohol and does not  use drugs.  Allergies: No Known Allergies   Review of Systems   Has history of mild hypertension managed by PCP   Examination:   BP 128/70   Pulse 65   Ht 5\' 4"  (1.626 m)   Wt 200 lb (90.7 kg)   SpO2 97%   BMI 34.33 kg/m   Mild prominence of the eyes present   Assessment/Plan:  Post ablative hypothyroidism:  This is secondary to her I-131 treatment in April 2021  Her thyroid levels have been fairly consistently in the normal range although low normal TSH on the previous visit  She has no complaints of fatigue and is taking her levothyroxine consistently  She well continue 88 mcg of levothyroxine  Follow-up in 1 year  Labs were ordered for her PCP to be drawn in our office for convenience  Reather Littler 05/20/2023, 8:46 AM    Note: This office note was prepared with Dragon voice recognition system technology. Any transcriptional errors that result from this process are unintentional.

## 2023-05-28 ENCOUNTER — Other Ambulatory Visit: Payer: Commercial Managed Care - PPO

## 2023-06-02 ENCOUNTER — Ambulatory Visit: Payer: 59 | Admitting: Endocrinology

## 2023-06-25 ENCOUNTER — Other Ambulatory Visit (HOSPITAL_COMMUNITY): Payer: Self-pay

## 2023-06-26 ENCOUNTER — Other Ambulatory Visit: Payer: 59

## 2023-06-30 ENCOUNTER — Ambulatory Visit: Payer: 59 | Admitting: Endocrinology

## 2023-07-13 ENCOUNTER — Encounter: Payer: Self-pay | Admitting: Family Medicine

## 2023-07-13 DIAGNOSIS — R0989 Other specified symptoms and signs involving the circulatory and respiratory systems: Secondary | ICD-10-CM

## 2023-07-31 ENCOUNTER — Other Ambulatory Visit: Payer: Self-pay

## 2023-07-31 MED ORDER — COVID-19 MRNA VAC-TRIS(PFIZER) 30 MCG/0.3ML IM SUSY
0.3000 mL | PREFILLED_SYRINGE | Freq: Once | INTRAMUSCULAR | 0 refills | Status: AC
  Filled 2023-07-31: qty 0.3, 1d supply, fill #0

## 2023-08-17 ENCOUNTER — Encounter: Payer: Self-pay | Admitting: Internal Medicine

## 2023-08-30 ENCOUNTER — Encounter (HOSPITAL_COMMUNITY): Payer: Self-pay | Admitting: Emergency Medicine

## 2023-08-30 ENCOUNTER — Ambulatory Visit (HOSPITAL_COMMUNITY)
Admission: EM | Admit: 2023-08-30 | Discharge: 2023-08-30 | Disposition: A | Discharge status: Home / Self Care | Payer: Commercial Managed Care - PPO | Attending: Emergency Medicine | Admitting: Emergency Medicine

## 2023-08-30 ENCOUNTER — Ambulatory Visit (INDEPENDENT_AMBULATORY_CARE_PROVIDER_SITE_OTHER): Payer: Commercial Managed Care - PPO

## 2023-08-30 DIAGNOSIS — J209 Acute bronchitis, unspecified: Secondary | ICD-10-CM

## 2023-08-30 DIAGNOSIS — R059 Cough, unspecified: Secondary | ICD-10-CM | POA: Diagnosis not present

## 2023-08-30 DIAGNOSIS — R0989 Other specified symptoms and signs involving the circulatory and respiratory systems: Secondary | ICD-10-CM | POA: Diagnosis not present

## 2023-08-30 DIAGNOSIS — J929 Pleural plaque without asbestos: Secondary | ICD-10-CM | POA: Diagnosis not present

## 2023-08-30 MED ORDER — AMOXICILLIN-POT CLAVULANATE 875-125 MG PO TABS: 1.0000 | ORAL_TABLET | Freq: Two times a day (BID) | ORAL | 0 refills | Status: DC

## 2023-08-30 MED ORDER — PREDNISONE 10 MG PO TABS: 20.0000 mg | ORAL_TABLET | Freq: Every day | ORAL | 0 refills | Status: AC

## 2023-08-30 MED ORDER — BENZONATATE 100 MG PO CAPS: 100.0000 mg | ORAL_CAPSULE | Freq: Three times a day (TID) | ORAL | 0 refills | Status: AC | PRN

## 2023-08-30 NOTE — ED Triage Notes (Signed)
Patient here today with c/o cough, chest congestion, wheeze, SOB, nasal drainage since Thursday night. She had a fever yesterday. Patient states that she tested herself for Covid 4 times and each time was negative. She has been taking Dayquil and Nyquil with some relief. Her son started having some symptoms last week.

## 2023-08-30 NOTE — ED Provider Notes (Signed)
[facility] CARE CENTER   [id number] 08/30/23 Arrival Time: 1005   Chief Complaint  Patient presents with   Cough     SUBJECTIVE: History from: patient.  Andrea Castro is a 67 y.o. female who presents to the urgent care with a complaint of cough, chest congestion, wheezing, shortness of breath and nasal drainage for the past 4 days.  Has tested negative for COVID via home test 4 times.  Denies recent travel.  Has tried OTC medication without relief.  Denies any alleviating or aggravating factors.  Did previous symptoms in the past.   Denies fever, chills, fatigue, sinus pain, rhinorrhea, sore throat,  chest pain, nausea, changes in bowel or bladder habits.    ROS: As per HPI.  All other pertinent ROS negative.       Past Medical History:  Diagnosis Date   ACL (anterior cruciate ligament) tear 2002   Arthritis    Atypical nevus 05/27/1996   Right Rim Ear-Slight   Cancer [facility]) 2011   left breast   Complication of anesthesia    History of breast cancer    History of breast reconstruction 07/28/2011   Hyphema of right eye    PONV (postoperative nausea and vomiting)    Ruptured disc, thoracic 08/1983   Past Surgical History:  Procedure Laterality Date   BACK SURGERY  1984   ruptured disc   CYSTOCELE REPAIR N/A 09/01/2019   Procedure: ANTERIOR REPAIR (CYSTOCELE);  Surgeon: Tracey Harries, MD;  Location: [facility];  Service: Gynecology;  Laterality: N/A;   HERNIA REPAIR  02/20/11   ventral hernia   KNEE ARTHROSCOPY Left 08/17/2015   Procedure: Left Knee Arthroscopy and Debridement;  Surgeon: Nadara Mustard, MD;  Location: [facility] OR;  Service: Orthopedics;  Laterality: Left;   KNEE ARTHROSCOPY WITH ANTERIOR CRUCIATE LIGAMENT (ACL) REPAIR Left 03/2010   MASTECTOMY  10/30/10   right, total   MASTECTOMY  05/22/10   left cancer/chemo and radiation   nipple construction  08/2011   tattoo at another date   PORT-A-CATH REMOVAL  01/09/2012   Procedure: MINOR REMOVAL  PORT-A-CATH;  Surgeon: Currie Paris, MD;  Location: [facility];  Service: General;  Laterality: N/A;   PORTACATH PLACEMENT  12/17/09   PUBOVAGINAL SLING N/A 09/01/2019   Procedure: Leonides Grills;  Surgeon: Alfredo Martinez, MD;  Location: [facility];  Service: Urology;  Laterality: N/A;   RECONSTRUCTION BREAST W/ TRAM FLAP  02/20/11   bilateral   RECTOCELE REPAIR N/A 09/01/2019   Procedure: POSSIBLE POSTERIOR REPAIR (RECTOCELE);  Surgeon: Tracey Harries, MD;  Location: [facility];  Service: Gynecology;  Laterality: N/A;  possible   torn ligament  2002   left knee   TUBAL LIGATION  2004   VAGINAL HYSTERECTOMY N/A 09/01/2019   Procedure: HYSTERECTOMY VAGINAL;  Surgeon: Tracey Harries, MD;  Location: [facility];  Service: Gynecology;  Laterality: N/A;   No Known Allergies No current facility-administered medications on file prior to encounter.   Current Outpatient Medications on File Prior to Encounter  Medication Sig Dispense Refill   aspirin EC 81 MG tablet Take 1 tablet (81 mg total) by mouth daily. 30 tablet 1   calcium-vitamin D (OSCAL WITH D) 500-200 MG-UNIT per tablet Take 1 tablet by mouth 2 (two) times daily.      ferrous sulfate 325 (65 FE) MG tablet Take 325 mg by mouth daily as needed.      fish oil-omega-3 fatty acids 1000  MG capsule Take 1 g by mouth 2 (two) times daily.     Glucosamine HCl 1000 MG TABS Take 1,000 mg by mouth 2 (two) times daily.     ibuprofen (ADVIL) 200 MG tablet Take 1 tablet (200 mg total) by mouth every 8 (eight) hours as needed.     levothyroxine (SYNTHROID) 88 MCG tablet Take 1 tablet (88 mcg total) by mouth daily. 90 tablet 3   lisinopril (ZESTRIL) 5 MG tablet Take 1 tablet (5 mg total) by mouth daily. 90 tablet 3   loratadine (CLARITIN) 10 MG tablet Take 10 mg by mouth daily as needed for allergies.     Multiple Vitamin (MULTIVITAMIN PO) Take 1 tablet by mouth daily.     zinc  gluconate 50 MG tablet Take 50 mg by mouth daily as needed.      Social History   Socioeconomic History   Marital status: Married    Spouse name: Not on file   Number of children: Not on file   Years of education: Not on file   Highest education level: Not on file  Occupational History   Not on file  Tobacco Use   Smoking status: Never   Smokeless tobacco: Never  Vaping Use   Vaping status: Never Used  Substance and Sexual Activity   Alcohol use: No   Drug use: No   Sexual activity: Yes    Birth control/protection: Post-menopausal  Other Topics Concern   Not on file  Social History Narrative   Not on file   Social Determinants of Health   Financial Resource Strain: Not on file  Food Insecurity: Not on file  Transportation Needs: Not on file  Physical Activity: Not on file  Stress: Not on file  Social Connections: Not on file  Intimate Partner Violence: Not on file   Family History  Problem Relation Age of Onset   COPD Father    Hypertension Father    Hypertension Mother    Parkinson's disease Mother     OBJECTIVE:  Vitals:   08/30/23 1019  BP: (!) 156/89  Pulse: 92  Resp: 16  Temp: 99.3 F (37.4 C)  TempSrc: Oral  SpO2: 96%  Weight: 198 lb (89.8 kg)  Height: 5' 4.25" (1.632 m)     Physical Exam Vitals and nursing note reviewed.  Constitutional:      General: She is not in acute distress.    Appearance: Normal appearance. She is normal weight. She is not ill-appearing, toxic-appearing or diaphoretic.  HENT:     Head: Normocephalic.     Right Ear: Tympanic membrane, ear canal and external ear normal. There is no impacted cerumen.     Left Ear: Tympanic membrane, ear canal and external ear normal. There is no impacted cerumen.     Nose: Congestion present.  Cardiovascular:     Rate and Rhythm: Normal rate and regular rhythm.     Pulses: Normal pulses.     Heart sounds: Normal heart sounds. No murmur heard.    No friction rub. No gallop.   Pulmonary:     Effort: Pulmonary effort is normal. No respiratory distress.     Breath sounds: Normal breath sounds. No stridor. No wheezing, rhonchi or rales.  Chest:     Chest wall: No tenderness.  Neurological:     Mental Status: She is alert and oriented to person, place, and time.      LABS:  No results found for this or any previous visit (from  the past 24 hour(s)).   RADIOLOGY:  DG Chest 2 View  Result Date: 08/30/2023 CLINICAL DATA:  Chest congestion and cough EXAM: CHEST - 2 VIEW COMPARISON:  09/28/2019 FINDINGS: Midline trachea. Normal heart size and mediastinal contours. No pleural effusion or pneumothorax. Surgical clips project over the left axilla and likely anterior central right chest wall. Clear lungs. Biapical pleural thickening. IMPRESSION: No active cardiopulmonary disease. Electronically Signed   By: Jeronimo Greaves M.D.   On: 08/30/2023 10:57      Chest X-ray is negative for cardiopulmonary disease.  I have reviewed the x-ray myself and the radiologist interpretation.  I am in agreement with the radiologist interpretation.   ASSESSMENT & PLAN:  1. Acute bronchitis, unspecified organism     Meds ordered this encounter  Medications   amoxicillin-clavulanate (AUGMENTIN) 875-125 MG tablet    Sig: Take 1 tablet by mouth every 12 (twelve) hours.    Dispense:  14 tablet    Refill:  0   predniSONE (DELTASONE) 10 MG tablet    Sig: Take 2 tablets (20 mg total) by mouth daily for 5 days.    Dispense:  10 tablet    Refill:  0   benzonatate (TESSALON) 100 MG capsule    Sig: Take 1 capsule (100 mg total) by mouth 3 (three) times daily as needed for up to 10 days for cough.    Dispense:  30 capsule    Refill:  0   Discharge instructions  Get plenty of rest and push fluids Augmentin prescribed/take as directed  Prednisone was prescribed Tessalon Perles prescribed use OTC zyrtec for nasal congestion, runny nose, and/or sore throat Use OTC flonase for nasal  congestion and runny nose Use medications daily for symptom relief Use OTC medications like ibuprofen or tylenol as needed fever or pain Call or go to the ED if you have any new or worsening symptoms such as fever, worsening cough, shortness of breath, chest tightness, chest pain, turning blue, changes in mental status, etc...   Reviewed expectations re: course of current medical issues. Questions answered. Outlined signs and symptoms indicating need for more acute intervention. Patient verbalized understanding. After Visit Summary given.          Durward Parcel, FNP 08/30/23 [phone]

## 2023-08-30 NOTE — Discharge Instructions (Addendum)
Get plenty of rest and push fluids Augmentin prescribed/take as directed  Prednisone was prescribed Tessalon Perles prescribed use OTC zyrtec for nasal congestion, runny nose, and/or sore throat Use OTC flonase for nasal congestion and runny nose Use medications daily for symptom relief Use OTC medications like ibuprofen or tylenol as needed fever or pain Call or go to the ED if you have any new or worsening symptoms such as fever, worsening cough, shortness of breath, chest tightness, chest pain, turning blue, changes in mental status, etc..Marland Kitchen

## 2023-09-03 ENCOUNTER — Other Ambulatory Visit (HOSPITAL_COMMUNITY): Payer: Self-pay

## 2023-09-03 ENCOUNTER — Ambulatory Visit: Payer: Commercial Managed Care - PPO

## 2023-09-03 VITALS — Ht 64.0 in | Wt 198.0 lb

## 2023-09-03 DIAGNOSIS — Z1211 Encounter for screening for malignant neoplasm of colon: Secondary | ICD-10-CM

## 2023-09-03 MED ORDER — NA SULFATE-K SULFATE-MG SULF 17.5-3.13-1.6 GM/177ML PO SOLN
1.0000 | Freq: Once | ORAL | 0 refills | Status: AC
Start: 2023-09-03 — End: 2023-10-13
  Filled 2023-09-03: qty 354, 2d supply, fill #0
  Filled 2023-10-12: qty 354, 1d supply, fill #0

## 2023-09-03 NOTE — Progress Notes (Signed)

## 2023-09-09 ENCOUNTER — Ambulatory Visit (HOSPITAL_COMMUNITY): Payer: Commercial Managed Care - PPO

## 2023-09-14 ENCOUNTER — Ambulatory Visit (HOSPITAL_COMMUNITY)
Admission: RE | Admit: 2023-09-14 | Discharge: 2023-09-14 | Disposition: A | Discharge status: Home / Self Care | Payer: Commercial Managed Care - PPO | Source: Ambulatory Visit | Attending: Family Medicine | Admitting: Family Medicine

## 2023-09-14 DIAGNOSIS — R0989 Other specified symptoms and signs involving the circulatory and respiratory systems: Secondary | ICD-10-CM | POA: Insufficient documentation

## 2023-09-14 DIAGNOSIS — I6523 Occlusion and stenosis of bilateral carotid arteries: Secondary | ICD-10-CM | POA: Diagnosis not present

## 2023-09-15 ENCOUNTER — Other Ambulatory Visit (HOSPITAL_COMMUNITY): Payer: Self-pay

## 2023-09-23 ENCOUNTER — Other Ambulatory Visit: Payer: Self-pay

## 2023-09-24 ENCOUNTER — Other Ambulatory Visit: Payer: Self-pay

## 2023-09-24 ENCOUNTER — Other Ambulatory Visit (HOSPITAL_COMMUNITY): Payer: Self-pay

## 2023-09-24 DIAGNOSIS — E890 Postprocedural hypothyroidism: Secondary | ICD-10-CM

## 2023-09-24 MED ORDER — LEVOTHYROXINE SODIUM 88 MCG PO TABS
88.0000 ug | ORAL_TABLET | Freq: Every day | ORAL | 3 refills | Status: DC
  Filled 2023-09-24: qty 90, 90d supply, fill #0
  Filled 2023-12-23: qty 90, 90d supply, fill #1
  Filled 2024-03-23: qty 90, 90d supply, fill #0

## 2023-10-01 ENCOUNTER — Encounter: Payer: Self-pay | Admitting: Internal Medicine

## 2023-10-06 ENCOUNTER — Ambulatory Visit: Payer: Medicare Other | Admitting: Family Medicine

## 2023-10-09 ENCOUNTER — Encounter: Payer: Commercial Managed Care - PPO | Admitting: Internal Medicine

## 2023-10-12 ENCOUNTER — Other Ambulatory Visit (HOSPITAL_COMMUNITY): Payer: Self-pay

## 2023-10-16 ENCOUNTER — Encounter: Payer: Self-pay | Admitting: Internal Medicine

## 2023-10-16 ENCOUNTER — Ambulatory Visit (AMBULATORY_SURGERY_CENTER): Payer: Commercial Managed Care - PPO | Admitting: Internal Medicine

## 2023-10-16 VITALS — BP 136/73 | HR 71 | Temp 98.1°F | Resp 17 | Ht 64.0 in | Wt 198.0 lb

## 2023-10-16 DIAGNOSIS — D124 Benign neoplasm of descending colon: Secondary | ICD-10-CM | POA: Diagnosis not present

## 2023-10-16 DIAGNOSIS — D120 Benign neoplasm of cecum: Secondary | ICD-10-CM | POA: Diagnosis not present

## 2023-10-16 DIAGNOSIS — K635 Polyp of colon: Secondary | ICD-10-CM

## 2023-10-16 DIAGNOSIS — Z1211 Encounter for screening for malignant neoplasm of colon: Secondary | ICD-10-CM | POA: Diagnosis not present

## 2023-10-16 DIAGNOSIS — D125 Benign neoplasm of sigmoid colon: Secondary | ICD-10-CM

## 2023-10-16 DIAGNOSIS — I10 Essential (primary) hypertension: Secondary | ICD-10-CM | POA: Diagnosis not present

## 2023-10-16 DIAGNOSIS — K648 Other hemorrhoids: Secondary | ICD-10-CM | POA: Diagnosis not present

## 2023-10-16 MED ORDER — SODIUM CHLORIDE 0.9 % IV SOLN: 500.0000 mL | Freq: Once | INTRAVENOUS | Status: DC

## 2023-10-16 NOTE — Progress Notes (Signed)
Report to PACU, RN, vss, BBS= Clear.  

## 2023-10-16 NOTE — Op Note (Signed)
[facility] Patient Name: Andrea Castro Procedure Date: 10/16/2023 9:05 AM MRN: [id number] Endoscopist: Particia Lather , , [id number] Age: 67 Referring MD:  Date of Birth: 01/14/1956 Gender: Female Account #: [id number] Procedure:                Colonoscopy Indications:              Screening for colorectal malignant neoplasm Medicines:                Monitored Anesthesia Care Procedure:                Pre-Anesthesia Assessment:                           - Prior to the procedure, a History and Physical                            was performed, and patient medications and                            allergies were reviewed. The patient's tolerance of                            previous anesthesia was also reviewed. The risks                            and benefits of the procedure and the sedation                            options and risks were discussed with the patient.                            All questions were answered, and informed consent                            was obtained. Prior Anticoagulants: The patient has                            taken no anticoagulant or antiplatelet agents. ASA                            Grade Assessment: II - A patient with mild systemic                            disease. After reviewing the risks and benefits,                            the patient was deemed in satisfactory condition to                            undergo the procedure.                           After obtaining informed consent, the colonoscope  was passed under direct vision. Throughout the                            procedure, the patient's blood pressure, pulse, and                            oxygen saturations were monitored continuously. The                            Olympus Scope SN [id number] was introduced through the                            anus and advanced to the the terminal ileum. The                             colonoscopy was performed without difficulty. The                            patient tolerated the procedure well. The quality                            of the bowel preparation was excellent. The                            terminal ileum, ileocecal valve, appendiceal                            orifice, and rectum were photographed. Scope In: 9:09:57 AM Scope Out: 9:23:35 AM Scope Withdrawal Time: 0 hours 10 minutes 20 seconds  Total Procedure Duration: 0 hours 13 minutes 38 seconds  Findings:                 The terminal ileum appeared normal.                           Two sessile polyps were found in the cecum. The                            polyps were 3 to 4 mm in size. These polyps were                            removed with a cold snare. Resection and retrieval                            were complete.                           Two sessile polyps were found in the sigmoid colon                            and descending colon. The polyps were 4 to 6 mm in                            size. These  polyps were removed with a cold snare.                            Resection and retrieval were complete.                           Non-bleeding internal hemorrhoids were found during                            retroflexion. Complications:            No immediate complications. Estimated Blood Loss:     Estimated blood loss was minimal. Impression:               - The examined portion of the ileum was normal.                           - Two 3 to 4 mm polyps in the cecum, removed with a                            cold snare. Resected and retrieved.                           - Two 4 to 6 mm polyps in the sigmoid colon and in                            the descending colon, removed with a cold snare.                            Resected and retrieved.                           - Non-bleeding internal hemorrhoids. Recommendation:           - Discharge patient to home (with escort).                            - Await pathology results.                           - The findings and recommendations were discussed                            with the patient. Dr Particia Lather "Alan Ripper" Leonides Schanz,  10/16/2023 9:29:17 AM

## 2023-10-16 NOTE — Patient Instructions (Signed)

## 2023-10-16 NOTE — Progress Notes (Signed)
GASTROENTEROLOGY PROCEDURE H&P NOTE   Primary Care Physician: Mechele Claude, MD    Reason for Procedure:   Colon cancer screening  Plan:    Colonoscopy  Patient is appropriate for endoscopic procedure(s) in the ambulatory ([facility]) setting.  The nature of the procedure, as well as the risks, benefits, and alternatives were carefully and thoroughly reviewed with the patient. Ample time for discussion and questions allowed. The patient understood, was satisfied, and agreed to proceed.     HPI: Andrea Castro is a 67 y.o. female who presents for colonoscopy for colon cancer screening.   Last colonoscopy was in 2013 with some diverticulosis.  Past Medical History:  Diagnosis Date   ACL (anterior cruciate ligament) tear 2002   Arthritis    Atypical nevus 05/27/1996   Right Rim Ear-Slight   Cancer [facility]) 2011   left breast   Complication of anesthesia    History of breast cancer    History of breast reconstruction 07/28/2011   Hypertension    Hyphema of right eye    PONV (postoperative nausea and vomiting)    Ruptured disc, thoracic 08/1983   Thyroid disease     Past Surgical History:  Procedure Laterality Date   BACK SURGERY  1984   ruptured disc   CYSTOCELE REPAIR N/A 09/01/2019   Procedure: ANTERIOR REPAIR (CYSTOCELE);  Surgeon: Tracey Harries, MD;  Location: [facility];  Service: Gynecology;  Laterality: N/A;   HERNIA REPAIR  02/20/11   ventral hernia   KNEE ARTHROSCOPY Left 08/17/2015   Procedure: Left Knee Arthroscopy and Debridement;  Surgeon: Nadara Mustard, MD;  Location: [facility] OR;  Service: Orthopedics;  Laterality: Left;   KNEE ARTHROSCOPY WITH ANTERIOR CRUCIATE LIGAMENT (ACL) REPAIR Left 03/2010   MASTECTOMY  10/30/10   right, total   MASTECTOMY  05/22/10   left cancer/chemo and radiation   nipple construction  08/2011   tattoo at another date   PORT-A-CATH REMOVAL  01/09/2012   Procedure: MINOR REMOVAL PORT-A-CATH;  Surgeon: Currie Paris,  MD;  Location: [facility];  Service: General;  Laterality: N/A;   PORTACATH PLACEMENT  12/17/09   PUBOVAGINAL SLING N/A 09/01/2019   Procedure: Leonides Grills;  Surgeon: Alfredo Martinez, MD;  Location: [facility];  Service: Urology;  Laterality: N/A;   RECONSTRUCTION BREAST W/ TRAM FLAP  02/20/11   bilateral   RECTOCELE REPAIR N/A 09/01/2019   Procedure: POSSIBLE POSTERIOR REPAIR (RECTOCELE);  Surgeon: Tracey Harries, MD;  Location: [facility];  Service: Gynecology;  Laterality: N/A;  possible   torn ligament  2002   left knee   TUBAL LIGATION  2004   VAGINAL HYSTERECTOMY N/A 09/01/2019   Procedure: HYSTERECTOMY VAGINAL;  Surgeon: Tracey Harries, MD;  Location: [facility];  Service: Gynecology;  Laterality: N/A;    Prior to Admission medications   Medication Sig Start Date End Date Taking? Authorizing Provider  levothyroxine (SYNTHROID) 88 MCG tablet Take 1 tablet (88 mcg total) by mouth daily. 09/24/23  Yes Th[place], [place], MD  lisinopril (ZESTRIL) 5 MG tablet Take 1 tablet (5 mg total) by mouth daily. 04/13/23  Yes Mechele Claude, MD  aspirin EC 81 MG tablet Take 1 tablet (81 mg total) by mouth daily. Patient not taking: Reported on 09/03/2023 08/17/15   Nadara Mustard, MD  calcium-vitamin D (OSCAL WITH D) 500-200 MG-UNIT per tablet Take 1 tablet by mouth 2 (two) times daily.     [provider]  ferrous sulfate 325 (65 FE) MG tablet Take 325 mg by mouth daily as needed.     [provider]  fish oil-omega-3 fatty acids 1000 MG capsule Take 1 g by mouth 2 (two) times daily.    [provider]  Glucosamine HCl 1000 MG TABS Take 1,000 mg by mouth 2 (two) times daily.    [provider]  ibuprofen (ADVIL) 200 MG tablet Take 1 tablet (200 mg total) by mouth every 8 (eight) hours as needed. 04/13/23   Mechele Claude, MD  loratadine (CLARITIN) 10 MG tablet Take 10 mg by mouth daily as needed for  allergies.    [provider]  Multiple Vitamin (MULTIVITAMIN PO) Take 1 tablet by mouth daily.    [provider]  zinc gluconate 50 MG tablet Take 50 mg by mouth daily as needed.     [provider]    Current Outpatient Medications  Medication Sig Dispense Refill   levothyroxine (SYNTHROID) 88 MCG tablet Take 1 tablet (88 mcg total) by mouth daily. 90 tablet 3   lisinopril (ZESTRIL) 5 MG tablet Take 1 tablet (5 mg total) by mouth daily. 90 tablet 3   aspirin EC 81 MG tablet Take 1 tablet (81 mg total) by mouth daily. (Patient not taking: Reported on 09/03/2023) 30 tablet 1   calcium-vitamin D (OSCAL WITH D) 500-200 MG-UNIT per tablet Take 1 tablet by mouth 2 (two) times daily.      ferrous sulfate 325 (65 FE) MG tablet Take 325 mg by mouth daily as needed.      fish oil-omega-3 fatty acids 1000 MG capsule Take 1 g by mouth 2 (two) times daily.     Glucosamine HCl 1000 MG TABS Take 1,000 mg by mouth 2 (two) times daily.     ibuprofen (ADVIL) 200 MG tablet Take 1 tablet (200 mg total) by mouth every 8 (eight) hours as needed.     loratadine (CLARITIN) 10 MG tablet Take 10 mg by mouth daily as needed for allergies.     Multiple Vitamin (MULTIVITAMIN PO) Take 1 tablet by mouth daily.     zinc gluconate 50 MG tablet Take 50 mg by mouth daily as needed.      Current Facility-Administered Medications  Medication Dose Route Frequency Provider Last Rate Last Admin   0.9 %  sodium chloride infusion  500 mL Intravenous Once Imogene Burn, MD        Allergies as of 10/16/2023   (No Known Allergies)    Family History  Problem Relation Age of Onset   Hypertension Mother    Parkinson's disease Mother    COPD Father    Hypertension Father    Esophageal cancer Sister    Colon cancer Neg Hx    Colon polyps Neg Hx    Rectal cancer Neg Hx    Stomach cancer Neg Hx     Social History   Socioeconomic History   Marital status: Married    Spouse name: Not on file    Number of children: Not on file   Years of education: Not on file   Highest education level: Not on file  Occupational History   Not on file  Tobacco Use   Smoking status: Never   Smokeless tobacco: Never  Vaping Use   Vaping status: Never Used  Substance and Sexual Activity   Alcohol use: Yes    Comment: occassional   Drug use: No   Sexual activity: Yes  Birth control/protection: Post-menopausal  Other Topics Concern   Not on file  Social History Narrative   Not on file   Social Drivers of Health   Financial Resource Strain: Not on file  Food Insecurity: Not on file  Transportation Needs: Not on file  Physical Activity: Not on file  Stress: Not on file  Social Connections: Not on file  Intimate Partner Violence: Not on file    Physical Exam: Vital signs in last 24 hours: BP (!) 152/95   Pulse 76   Temp 98.1 F (36.7 C) (Temporal)   Ht 5\' 4"  (1.626 m)   Wt 198 lb (89.8 kg)   SpO2 97%   BMI 33.99 kg/m  GEN: NAD EYE: Sclerae anicteric ENT: MMM CV: Non-tachycardic Pulm: No increased work of breathing GI: Soft, NT/ND NEURO:  Alert & Oriented   Eulah Pont, MD [facility]  10/16/2023 9:02 AM

## 2023-10-16 NOTE — Progress Notes (Signed)
Called to room to assist during endoscopic procedure.  Patient ID and intended procedure confirmed with present staff. Received instructions for my participation in the procedure from the performing physician.  

## 2023-10-16 NOTE — Progress Notes (Signed)
I have reviewed the patient's medical history in detail and updated the computerized patient record.

## 2023-10-19 ENCOUNTER — Telehealth: Payer: Self-pay

## 2023-10-19 NOTE — Telephone Encounter (Signed)
  Follow up Call-     10/16/2023    8:26 AM  Call back number  Post procedure Call Back phone  # [phone]  Permission to leave phone message Yes     Patient questions:  Do you have a fever, pain , or abdominal swelling? No. Pain Score  0 *  Have you tolerated food without any problems? Yes.    Have you been able to return to your normal activities? Yes.    Do you have any questions about your discharge instructions: Diet   No. Medications  No. Follow up visit  No.  Do you have questions or concerns about your Care? No.  Actions: * If pain score is 4 or above: No action needed, pain <4.

## 2023-10-22 ENCOUNTER — Encounter: Payer: Self-pay | Admitting: Internal Medicine

## 2023-10-22 LAB — SURGICAL PATHOLOGY

## 2023-11-23 ENCOUNTER — Ambulatory Visit: Payer: Medicare Other | Admitting: Family Medicine

## 2023-12-24 ENCOUNTER — Other Ambulatory Visit (HOSPITAL_COMMUNITY): Payer: Self-pay

## 2024-01-27 DIAGNOSIS — R002 Palpitations: Secondary | ICD-10-CM | POA: Diagnosis not present

## 2024-01-30 DIAGNOSIS — I4719 Other supraventricular tachycardia: Secondary | ICD-10-CM | POA: Diagnosis not present

## 2024-01-30 DIAGNOSIS — Z9189 Other specified personal risk factors, not elsewhere classified: Secondary | ICD-10-CM | POA: Diagnosis not present

## 2024-01-30 DIAGNOSIS — R0602 Shortness of breath: Secondary | ICD-10-CM | POA: Diagnosis not present

## 2024-01-30 DIAGNOSIS — R079 Chest pain, unspecified: Secondary | ICD-10-CM | POA: Diagnosis not present

## 2024-02-05 ENCOUNTER — Other Ambulatory Visit (HOSPITAL_COMMUNITY): Payer: Self-pay | Admitting: Internal Medicine

## 2024-02-05 DIAGNOSIS — R0602 Shortness of breath: Secondary | ICD-10-CM

## 2024-02-05 DIAGNOSIS — R079 Chest pain, unspecified: Secondary | ICD-10-CM

## 2024-02-18 ENCOUNTER — Telehealth (HOSPITAL_COMMUNITY): Payer: Self-pay | Admitting: *Deleted

## 2024-02-18 ENCOUNTER — Other Ambulatory Visit: Payer: Self-pay

## 2024-02-18 ENCOUNTER — Other Ambulatory Visit (HOSPITAL_COMMUNITY): Payer: Self-pay | Admitting: *Deleted

## 2024-02-18 ENCOUNTER — Other Ambulatory Visit (HOSPITAL_COMMUNITY): Payer: Self-pay

## 2024-02-18 MED ORDER — METOPROLOL TARTRATE 100 MG PO TABS
100.0000 mg | ORAL_TABLET | ORAL | 0 refills | Status: DC
  Filled 2024-02-18: qty 1, 1d supply, fill #0

## 2024-02-18 MED ORDER — METOPROLOL TARTRATE 100 MG PO TABS
100.0000 mg | ORAL_TABLET | ORAL | 0 refills | Status: DC
  Filled 2024-02-18 (×2): qty 1, 1d supply, fill #0

## 2024-02-18 NOTE — Telephone Encounter (Signed)
 Patient returning call about her upcoming cardiac imaging study; pt verbalizes understanding of appt date/time, parking situation and where to check in, pre-test NPO status and medications ordered, and verified current allergies; name and call back number provided for further questions should they arise  Chase Copping RN Navigator Cardiac Imaging [facility] [phone] office [phone] cell  Patient to take 100mg  metoprolol  tartrate two hours prior to her cardiac CT scan. She is aware to arrive at 11 AM.

## 2024-02-18 NOTE — Telephone Encounter (Signed)
 Attempted to call patient regarding upcoming cardiac CT appointment. Left message on voicemail with name and callback number  Larey Brick RN Navigator Cardiac Imaging [facility] [phone] Office [phone] Cell

## 2024-02-19 ENCOUNTER — Ambulatory Visit (HOSPITAL_COMMUNITY)
Admission: RE | Admit: 2024-02-19 | Discharge: 2024-02-19 | Disposition: A | Discharge status: Home / Self Care | Source: Ambulatory Visit | Attending: Internal Medicine | Admitting: Internal Medicine

## 2024-02-19 ENCOUNTER — Encounter (HOSPITAL_COMMUNITY): Payer: Self-pay

## 2024-02-19 DIAGNOSIS — R0602 Shortness of breath: Secondary | ICD-10-CM | POA: Diagnosis present

## 2024-02-19 DIAGNOSIS — K449 Diaphragmatic hernia without obstruction or gangrene: Secondary | ICD-10-CM | POA: Diagnosis not present

## 2024-02-19 DIAGNOSIS — R079 Chest pain, unspecified: Secondary | ICD-10-CM | POA: Insufficient documentation

## 2024-02-19 DIAGNOSIS — Z9013 Acquired absence of bilateral breasts and nipples: Secondary | ICD-10-CM | POA: Insufficient documentation

## 2024-02-19 DIAGNOSIS — I2510 Atherosclerotic heart disease of native coronary artery without angina pectoris: Secondary | ICD-10-CM | POA: Insufficient documentation

## 2024-02-19 DIAGNOSIS — I700 Atherosclerosis of aorta: Secondary | ICD-10-CM | POA: Diagnosis not present

## 2024-02-19 MED ORDER — IOHEXOL 350 MG/ML SOLN
100.0000 mL | Freq: Once | INTRAVENOUS | Status: AC | PRN
  Administered 2024-02-19: 100 mL via INTRAVENOUS

## 2024-02-19 MED ORDER — NITROGLYCERIN 0.4 MG SL SUBL
SUBLINGUAL_TABLET | SUBLINGUAL | Status: AC
  Filled 2024-02-19: qty 2

## 2024-02-19 MED ORDER — NITROGLYCERIN 0.4 MG SL SUBL
0.8000 mg | SUBLINGUAL_TABLET | Freq: Once | SUBLINGUAL | Status: AC
Start: 2024-02-19 — End: 2024-02-19
  Administered 2024-02-19: 0.8 mg via SUBLINGUAL

## 2024-03-10 ENCOUNTER — Other Ambulatory Visit (HOSPITAL_COMMUNITY): Payer: Self-pay | Admitting: Internal Medicine

## 2024-03-10 DIAGNOSIS — R0602 Shortness of breath: Secondary | ICD-10-CM

## 2024-03-23 ENCOUNTER — Other Ambulatory Visit: Payer: Self-pay

## 2024-03-24 ENCOUNTER — Other Ambulatory Visit: Payer: Self-pay

## 2024-04-06 ENCOUNTER — Ambulatory Visit (HOSPITAL_COMMUNITY)
Admission: RE | Admit: 2024-04-06 | Discharge: 2024-04-06 | Disposition: A | Discharge status: Home / Self Care | Source: Ambulatory Visit | Attending: Cardiology | Admitting: Cardiology

## 2024-04-06 DIAGNOSIS — I517 Cardiomegaly: Secondary | ICD-10-CM

## 2024-04-06 DIAGNOSIS — R0602 Shortness of breath: Secondary | ICD-10-CM | POA: Diagnosis not present

## 2024-04-06 DIAGNOSIS — I358 Other nonrheumatic aortic valve disorders: Secondary | ICD-10-CM | POA: Diagnosis not present

## 2024-04-06 DIAGNOSIS — I5030 Unspecified diastolic (congestive) heart failure: Secondary | ICD-10-CM | POA: Diagnosis not present

## 2024-04-06 LAB — ECHOCARDIOGRAM COMPLETE: S' Lateral: 2.83 cm

## 2024-04-18 ENCOUNTER — Telehealth: Payer: Self-pay

## 2024-04-18 ENCOUNTER — Other Ambulatory Visit: Payer: Self-pay | Admitting: Internal Medicine

## 2024-04-18 NOTE — Progress Notes (Signed)
 Encounter created in error

## 2024-04-18 NOTE — Telephone Encounter (Signed)
 Error

## 2024-04-21 NOTE — Telephone Encounter (Signed)
 Pt needs to get labs at visit.

## 2024-04-29 ENCOUNTER — Other Ambulatory Visit: Payer: Commercial Managed Care - PPO

## 2024-05-06 ENCOUNTER — Ambulatory Visit: Payer: Commercial Managed Care - PPO | Admitting: Endocrinology

## 2024-05-17 ENCOUNTER — Ambulatory Visit: Payer: Commercial Managed Care - PPO | Admitting: Endocrinology

## 2024-05-17 ENCOUNTER — Encounter: Payer: Self-pay | Admitting: Endocrinology

## 2024-05-17 VITALS — BP 130/70 | HR 65 | Resp 20 | Ht 64.0 in | Wt 197.4 lb

## 2024-05-17 DIAGNOSIS — Z8639 Personal history of other endocrine, nutritional and metabolic disease: Secondary | ICD-10-CM | POA: Diagnosis not present

## 2024-05-17 DIAGNOSIS — E890 Postprocedural hypothyroidism: Secondary | ICD-10-CM | POA: Diagnosis not present

## 2024-05-17 LAB — TSH: TSH: 1.13 m[IU]/L (ref 0.40–4.50)

## 2024-05-17 LAB — T4, FREE: Free T4: 1.8 ng/dL (ref 0.8–1.8)

## 2024-05-17 NOTE — Progress Notes (Signed)
 Outpatient Endocrinology Note Andrea Lisvet Rasheed, MD   Patient's Name: Andrea Castro    DOB: 1956-09-06    MRN: [id number]  REASON OF VISIT: Follow-up for hypothyroidism  PCP: Zollie Lowers, MD  HISTORY OF PRESENT ILLNESS:   Andrea Castro is a 68 y.o. old female with past medical history as listed below is presented for a follow up for postablative hypothyroidism.   Pertinent Thyroid  History: Patient was previously seen by Dr. Von and was last time seen in July 2024.  Patient has postablative hypothyroidism, status post radioactive iodine therapy with 17 mCi of RAI I-131 for Graves' disease in  February 03, 2020.  She was started on levothyroxine  in August 2021, initially treated with levothyroxine  100 mcg daily and was later decreased to 88 mcg daily in December 2023.  Background: with the onset of hyperthyroidism in December 2020, she had lost 43 pounds of weight, had sickness of for her entire body and palpitation, hot flashes, weakness of the arms.  She had positive thyrotropin receptor antibody consistent with Graves' disease.  She has no Graves' eye disease.   Interval history Patient has been taking levothyroxine  88 mcg daily, she takes in the morning and reports compliance.  She denies palpitation and heat intolerance.  No change in bowel habits.  She reports lost about 6 pounds of weight in the last few weeks, she has been following a special diet.  She has also been following with primary care provider and possibly considering weight loss medication.  She reports she had hemoglobin A1c checked with PCP few months ago and was normal.  Denies redness or watering of the eyes.  She uses contact lenses.  No other complaints today.  REVIEW OF SYSTEMS:  As per history of present illness.   PAST MEDICAL HISTORY: Past Medical History:  Diagnosis Date   ACL (anterior cruciate ligament) tear 2002   Arthritis    Atypical nevus 05/27/1996   Right Rim Ear-Slight   [facility]) 2011    left breast   Complication of anesthesia    History of breast cancer    History of breast reconstruction 07/28/2011   Hypertension    Hyphema of right eye    PONV (postoperative nausea and vomiting)    Ruptured disc, thoracic 08/1983   Thyroid  disease     PAST SURGICAL HISTORY: Past Surgical History:  Procedure Laterality Date   BACK SURGERY  1984   ruptured disc   CYSTOCELE REPAIR N/A 09/01/2019   Procedure: ANTERIOR REPAIR (CYSTOCELE);  Surgeon: Austin Ned, MD;  Location: [facility];  Service: Gynecology;  Laterality: N/A;   HERNIA REPAIR  02/20/11   ventral hernia   KNEE ARTHROSCOPY Left 08/17/2015   Procedure: Left Knee Arthroscopy and Debridement;  Surgeon: Jerona Harden GAILS, MD;  Location: [facility] OR;  Service: Orthopedics;  Laterality: Left;   KNEE ARTHROSCOPY WITH ANTERIOR CRUCIATE LIGAMENT (ACL) REPAIR Left 03/2010   MASTECTOMY  10/30/10   right, total   MASTECTOMY  05/22/10   left cancer/chemo and radiation   nipple construction  08/2011   tattoo at another date   PORT-A-CATH REMOVAL  01/09/2012   Procedure: MINOR REMOVAL PORT-A-CATH;  Surgeon: Sherlean JINNY Laughter, MD;  Location: [facility];  Service: General;  Laterality: N/A;   PORTACATH PLACEMENT  12/17/09   PUBOVAGINAL SLING N/A 09/01/2019   Procedure: CARLOYN GLADE;  Surgeon: Gaston Hamilton, MD;  Location: [facility];  Service: Urology;  Laterality: N/A;   RECONSTRUCTION  BREAST W/ TRAM FLAP  02/20/11   bilateral   RECTOCELE REPAIR N/A 09/01/2019   Procedure: POSSIBLE POSTERIOR REPAIR (RECTOCELE);  Surgeon: Austin Ned, MD;  [facility];  Service: Gynecology;  Laterality: N/A;  possible   torn ligament  2002   left knee   TUBAL LIGATION  2004   VAGINAL HYSTERECTOMY N/A 09/01/2019   Procedure: HYSTERECTOMY VAGINAL;  Surgeon: Austin Ned, MD;  Location: [facility];  Service: Gynecology;  Laterality: N/A;    ALLERGIES: No  Known Allergies  FAMILY HISTORY:  Family History  Problem Relation Age of Onset   Hypertension Mother    Parkinson's disease Mother    COPD Father    Hypertension Father    Esophageal cancer Sister    Colon cancer Neg Hx    Colon polyps Neg Hx    Rectal cancer Neg Hx    Stomach cancer Neg Hx     SOCIAL HISTORY: Social History   Socioeconomic History   Marital status: Married    Spouse name: Not on file   Number of children: Not on file   Years of education: Not on file   Highest education level: Not on file  Occupational History   Not on file  Tobacco Use   Smoking status: Never   Smokeless tobacco: Never  Vaping Use   Vaping status: Never Used  Substance and Sexual Activity   Alcohol use: Yes    Comment: occassional   Drug use: No   Sexual activity: Yes    Birth control/protection: Post-menopausal  Other Topics Concern   Not on file  Social History Narrative   Not on file   Social Drivers of Health   Financial Resource Strain: Not on file  Food Insecurity: Not on file  Transportation Needs: Not on file  Physical Activity: Not on file  Stress: Not on file  Social Connections: Not on file    MEDICATIONS:  Current Outpatient Medications  Medication Sig Dispense Refill   calcium-vitamin D  (OSCAL WITH D) 500-200 MG-UNIT per tablet Take 1 tablet by mouth 2 (two) times daily.      ferrous sulfate 325 (65 FE) MG tablet Take 325 mg by mouth daily as needed.      fish oil-omega-3 fatty acids 1000 MG capsule Take 1 g by mouth 2 (two) times daily.     Glucosamine HCl 1000 MG TABS Take 1,000 mg by mouth 2 (two) times daily.     ibuprofen  (ADVIL ) 200 MG tablet Take 1 tablet (200 mg total) by mouth every 8 (eight) hours as needed.     levothyroxine  (SYNTHROID ) 88 MCG tablet Take 1 tablet (88 mcg total) by mouth daily. 90 tablet 3   lisinopril  (ZESTRIL ) 5 MG tablet Take 1 tablet (5 mg total) by mouth daily. 90 tablet 3   loratadine  (CLARITIN ) 10 MG tablet Take 10 mg by  mouth daily as needed for allergies.     Multiple Vitamin (MULTIVITAMIN PO) Take 1 tablet by mouth daily.     zinc gluconate 50 MG tablet Take 50 mg by mouth daily as needed.      aspirin  EC 81 MG tablet Take 1 tablet (81 mg total) by mouth daily. (Patient not taking: Reported on 05/17/2024) 30 tablet 1   metoprolol  tartrate (LOPRESSOR ) 100 MG tablet Take one tablet (100mg ) TWO hours prior to your cardiac CT scan. (Patient not taking: Reported on 05/17/2024) 1 tablet 0   No current facility-administered medications for this visit.  PHYSICAL EXAM: Vitals:   05/17/24 1057  BP: 130/70  Pulse: 65  Resp: 20  SpO2: 99%  Weight: 197 lb 6.4 oz (89.5 kg)  Height: 5' 4 (1.626 m)   Body mass index is 33.88 kg/m.  Wt Readings from Last 3 Encounters:  05/17/24 197 lb 6.4 oz (89.5 kg)  10/16/23 198 lb (89.8 kg)  09/03/23 198 lb (89.8 kg)    General: Well developed, well nourished female in no apparent distress.  HEENT: AT/[place], no external lesions. Hearing intact to the spoken word Eyes: EOMI. No stare, proptosis or lid lag. Conjunctiva clear and no icterus.  No redness or watering of the eyes.  Prominent eyeballs.   Neck: Trachea midline, neck supple without appreciable thyromegaly or lymphadenopathy and no palpable thyroid  nodules Lungs: Clear to auscultation, no wheeze. Respirations not labored Heart: S1S2, Regular in rate and rhythm. No loud murmurs Abdomen: Soft Neurologic: Alert, oriented, normal speech, deep tendon biceps reflexes normal,  no gross focal neurological deficit Extremities: No pedal pitting edema, no tremors of outstretched hands Skin: Warm, color good.  Psychiatric: Does not appear depressed or anxious  PERTINENT HISTORIC LABORATORY AND IMAGING STUDIES:  All pertinent laboratory results were reviewed. Please see HPI also for further details.   TSH  Date Value Ref Range Status  05/07/2023 2.79 0.35 - 5.50 uIU/mL Final  06/20/2022 0.46 0.35 - 5.50 uIU/mL Final   05/12/2022 0.684 0.450 - 4.500 uIU/mL Final     ASSESSMENT / PLAN  1. Postablative hypothyroidism   2. H/O Graves' disease    -Patient has postablative hypothyroidism, status post RAI I-131 ablation in April 2021 for Graves' disease.  She has been taking levothyroxine  88 mcg daily since December of 2023. - She is clinically euthyroid in the clinic today.  Plan: - Check thyroid  function test TSH, free T4 , adjust the dose as needed. - Annual endocrinology follow-up and thyroid  labs.  Diagnoses and all orders for this visit:  Postablative hypothyroidism -     T4, free -     TSH  H/O Graves' disease   DISPOSITION Follow up in clinic in 12 months suggested.  All questions answered and patient verbalized understanding of the plan.  Andrea Cortlin Marano, MD [facility] Endocrinology [facility] [address] [place], [address] [place], [place] [address] Phone # [phone]  At least part of this note was generated using voice recognition software. Inadvertent word errors may have occurred, which were not recognized during the proofreading process.

## 2024-05-18 ENCOUNTER — Other Ambulatory Visit: Payer: Self-pay | Admitting: Endocrinology

## 2024-05-18 ENCOUNTER — Other Ambulatory Visit: Payer: Self-pay

## 2024-05-18 ENCOUNTER — Ambulatory Visit: Payer: Self-pay | Admitting: Endocrinology

## 2024-05-18 DIAGNOSIS — E890 Postprocedural hypothyroidism: Secondary | ICD-10-CM

## 2024-05-18 MED ORDER — LEVOTHYROXINE SODIUM 88 MCG PO TABS
88.0000 ug | ORAL_TABLET | Freq: Every day | ORAL | 3 refills | Status: AC
  Filled 2024-05-18 – 2024-06-14 (×2): qty 90, 90d supply, fill #0
  Filled 2024-09-20: qty 90, 90d supply, fill #1
  Filled 2024-11-23: qty 90, 90d supply, fill #2

## 2024-06-14 ENCOUNTER — Other Ambulatory Visit: Payer: Self-pay

## 2024-06-15 ENCOUNTER — Other Ambulatory Visit: Payer: Self-pay

## 2024-08-03 ENCOUNTER — Ambulatory Visit (INDEPENDENT_AMBULATORY_CARE_PROVIDER_SITE_OTHER): Admitting: Family Medicine

## 2024-08-03 ENCOUNTER — Encounter: Payer: Self-pay | Admitting: Family Medicine

## 2024-08-03 VITALS — BP 126/75 | HR 65 | Temp 98.2°F | Ht 64.0 in | Wt 186.0 lb

## 2024-08-03 DIAGNOSIS — Z23 Encounter for immunization: Secondary | ICD-10-CM | POA: Diagnosis not present

## 2024-08-03 DIAGNOSIS — Z170 Estrogen receptor positive status [ER+]: Secondary | ICD-10-CM

## 2024-08-03 DIAGNOSIS — E785 Hyperlipidemia, unspecified: Secondary | ICD-10-CM | POA: Diagnosis not present

## 2024-08-03 DIAGNOSIS — C50412 Malignant neoplasm of upper-outer quadrant of left female breast: Secondary | ICD-10-CM | POA: Diagnosis not present

## 2024-08-03 DIAGNOSIS — E039 Hypothyroidism, unspecified: Secondary | ICD-10-CM

## 2024-08-03 DIAGNOSIS — Z0001 Encounter for general adult medical examination with abnormal findings: Secondary | ICD-10-CM | POA: Diagnosis not present

## 2024-08-03 DIAGNOSIS — I10 Essential (primary) hypertension: Secondary | ICD-10-CM

## 2024-08-03 LAB — URINALYSIS
Bilirubin, UA: NEGATIVE
Glucose, UA: NEGATIVE
Ketones, UA: NEGATIVE
Nitrite, UA: NEGATIVE
Protein,UA: NEGATIVE
Specific Gravity, UA: <=1.005 — AB (ref 1.005–1.030)
Urobilinogen, Ur: 0.2 mg/dL (ref 0.2–1.0)
pH, UA: 6 (ref 5.0–7.5)

## 2024-08-03 NOTE — Progress Notes (Addendum)
 Subjective:  Patient ID: Andrea Castro, female    DOB: 1956-07-07  Age: 68 y.o. MRN: [id number]  CC: Annual Exam   HPI  Discussed the use of AI scribe software for clinical note transcription with the patient, who gave verbal consent to proceed.  History of Present Illness Andrea Castro is a 68 year old female who presents for an annual physical exam.  No new symptoms. No chest pain, cough, shortness of breath, joint pains, urinary issues, headaches, vision problems, dizziness, or hearing issues.  She has lost approximately 20 pounds, reducing her weight from 202 pounds to 182 pounds, by following the Noom diet and being mindful of her eating habits.  She is taking lisinopril  5 mg for hypertension, and her blood pressure readings at home are typically under 130/80 mmHg. She also takes thyroid  medication with recent lab results showing a TSH of 1.123 and free T4 of 1.8, both within normal ranges.  Occasionally takes ibuprofen  for headaches or joint pain, but not regularly. Uses Oscal for bone health and glucosamine for joint health. Takes Claritin  seasonally for allergies, primarily in the spring, and uses zinc PRN when feeling congested.  History of breast cancer, currently in remission for ten years. She had meniscus surgery in the past and occasionally experiences a slight sensation in her knee, but it is not painful.  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          08/03/2024   11:21 AM 04/13/2023    3:35 PM 05/12/2022    8:31 AM  Depression screen PHQ 2/9  Decreased Interest 0 0 0  Down, Depressed, Hopeless 0 0 0  PHQ - 2 Score 0 0 0    History [place] has a past medical history of ACL (anterior cruciate ligament) tear (2002), Arthritis, Atypical nevus (05/27/1996), Cancer (HCC) (2011), Complication of anesthesia, History of breast cancer, History of breast reconstruction (07/28/2011),  Hypertension, Hyphema of right eye, PONV (postoperative nausea and vomiting), Ruptured disc, thoracic (08/1983), and Thyroid  disease.   She has a past surgical history that includes Mastectomy (10/30/2010); Portacath placement (12/17/2009); Back surgery (10/20/1982); torn ligament (10/20/2000); Tubal ligation (10/20/2002); Mastectomy (05/22/2010); Reconstruction breast w/ tram flap (02/20/2011); Hernia repair (02/20/2011); Port-a-cath removal (01/09/2012); nipple construction (08/21/2011); Knee arthroscopy with anterior cruciate ligament (acl) repair (Left, 03/20/2010); Knee arthroscopy (Left, 08/17/2015); Vaginal hysterectomy (N/A, 09/01/2019); Cystocele repair (N/A, 09/01/2019); Rectocele repair (N/A, 09/01/2019); Pubovaginal sling (N/A, 09/01/2019); Abdominal hysterectomy (Aug 2020); Cosmetic surgery (Aug 2012); Breast surgery (2011 & 2012); and Spine surgery (Nov 1984).   Her family history includes Arthritis in her mother, sister, and sister; COPD in her father; Esophageal cancer in her sister; Hypertension in her brother, brother, brother, father, mother, sister, sister, sister, sister, and sister; Parkinson's disease in her mother.She reports that she has never smoked. She has never used smokeless tobacco. She reports current alcohol use of about 2.0 standard drinks of alcohol per week. She reports that she does not use drugs.    ROS Review of Systems  Constitutional:  Negative for appetite change, chills, diaphoresis, fatigue, fever and unexpected weight change.  HENT:  Negative for congestion, ear pain, hearing loss, postnasal drip, rhinorrhea, sneezing, sore throat and trouble swallowing.   Eyes:  Negative for pain.  Respiratory:  Negative for cough, chest tightness and shortness of breath.   Cardiovascular:  Negative for chest pain and palpitations.  Gastrointestinal:  Negative for abdominal pain,  constipation, diarrhea, nausea and vomiting.  Endocrine: Negative for cold intolerance, heat  intolerance, polydipsia, polyphagia and polyuria.  Genitourinary:  Negative for dysuria, frequency and menstrual problem.  Musculoskeletal:  Negative for arthralgias and joint swelling.  Skin:  Negative for rash.  Allergic/Immunologic: Negative for environmental allergies.  Neurological:  Negative for dizziness, weakness, numbness and headaches.  Psychiatric/Behavioral:  Negative for agitation and dysphoric mood.     Objective:  BP 126/75   Pulse 65   Temp 98.2 F (36.8 C)   Ht 5' 4 (1.626 m)   Wt 186 lb (84.4 kg)   SpO2 96%   BMI 31.93 kg/m   BP Readings from Last 3 Encounters:  08/03/24 126/75  05/17/24 130/70  02/19/24 (!) 172/100    Wt Readings from Last 3 Encounters:  08/03/24 186 lb (84.4 kg)  05/17/24 197 lb 6.4 oz (89.5 kg)  10/16/23 198 lb (89.8 kg)     Physical Exam Constitutional:      General: She is not in acute distress.    Appearance: She is well-developed.  HENT:     Head: Normocephalic and atraumatic.  Eyes:     Conjunctiva/sclera: Conjunctivae normal.     Pupils: Pupils are equal, round, and reactive to light.  Neck:     Thyroid : No thyromegaly.  Cardiovascular:     Rate and Rhythm: Normal rate and regular rhythm.     Heart sounds: Normal heart sounds. No murmur heard. Pulmonary:     Effort: Pulmonary effort is normal. No respiratory distress.     Breath sounds: Normal breath sounds. No wheezing or rales.  Abdominal:     General: Bowel sounds are normal. There is no distension.     Palpations: Abdomen is soft.     Tenderness: There is no abdominal tenderness.  Musculoskeletal:        General: Normal range of motion.     Cervical back: Normal range of motion and neck supple.  Lymphadenopathy:     Cervical: No cervical adenopathy.  Skin:    General: Skin is warm and dry.  Neurological:     Mental Status: She is alert and oriented to person, place, and time.  Psychiatric:        Behavior: Behavior normal.        Thought Content: Thought  content normal.        Judgment: Judgment normal.    Physical Exam VITALS: BP- 126/75 MEASUREMENTS: Weight- 186. GENERAL: Alert, cooperative, well developed, no acute distress. HEENT: Normocephalic, normal oropharynx, moist mucous membranes, oral cavity normal. CHEST: Clear to auscultation bilaterally, no wheezes, rhonchi, or crackles. CARDIOVASCULAR: Normal heart rate and rhythm, S1 and S2 normal without murmurs. ABDOMEN: Soft, non-tender, non-distended, without organomegaly, normal bowel sounds. EXTREMITIES: No cyanosis or edema. NEUROLOGICAL: Cranial nerves grossly intact, moves all extremities without gross motor or sensory deficit.   Assessment & Plan:  Essential hypertension -     CMP14+EGFR -     CBC with Differential/Platelet -     Lipid panel -     Urinalysis  Malignant neoplasm of upper-outer quadrant of left breast in female, estrogen receptor positive (HCC)  Hyperlipidemia, unspecified hyperlipidemia type -     CMP14+EGFR -     CBC with Differential/Platelet -     Lipid panel -     Urinalysis  Hypothyroidism, unspecified type -     CMP14+EGFR -     CBC with Differential/Platelet -     Lipid panel -  Urinalysis  Need for Tdap vaccination -     Tdap vaccine greater than or equal to 7yo IM    Assessment and Plan Assessment & Plan Adult Wellness Visit   Routine adult wellness visit with no new symptoms. She achieved weight loss through the Noom diet, now weighing 186 pounds. No issues with dizziness, headaches, vision, or hearing. Medications, including over-the-counter ones, were reviewed. Perform routine blood work and conduct a physical examination.  Essential hypertension   Blood pressure is well-controlled on Lisinopril  5 mg daily, with today's reading at 126/75 mmHg and consistent readings within the target range. Continue Lisinopril  5 mg orally daily and monitor blood pressure regularly.  Hyperlipidemia   Cholesterol levels will be checked as part  of today's blood work. Discussed aspirin  use and decided to focus on controlling cholesterol and blood pressure instead. Order a lipid panel as part of blood work.  Hypothyroidism   Condition is well-managed on Levothyroxine  88 mcg daily. Recent TSH and free T4 levels are within normal range (TSH 1.123, free T4 1.8). Continue Levothyroxine  88 mcg orally daily.  Breast cancer in remission   Breast cancer has been in remission for over ten years. No ongoing treatments are required.  Allergic rhinitis, seasonal   Seasonal allergic rhinitis primarily affects her in spring due to pollen from pine trees and flowers. She is not currently taking Loratadine  as symptoms are not present. Use Loratadine  10 mg orally daily as needed during allergy season.  General Health Maintenance   She is due for a Tdap vaccination and has not received the RSV vaccine. Discussion on the RSV vaccine indicates it is optional unless she has asthma or is frequently around young children. She prefers to wait on the COVID-19 vaccine due to concerns about side effects and limited duration of efficacy. Administer the Tdap vaccine today. Consider the RSV vaccine based on exposure risk. Wait two weeks after Tdap before considering the COVID-19 vaccine. Encourage regular dental and eye exams to monitor for glaucoma, macular degeneration, and cataracts.       Follow-up: Return in about 6 months (around 02/01/2025).  Butler Der, M.D.

## 2024-08-04 ENCOUNTER — Ambulatory Visit: Payer: Self-pay | Admitting: Family Medicine

## 2024-08-04 ENCOUNTER — Telehealth: Payer: Self-pay

## 2024-08-04 LAB — CBC WITH DIFFERENTIAL/PLATELET
Basophils Absolute: 0 x10E3/uL (ref 0.0–0.2)
Basos: 1 %
EOS (ABSOLUTE): 0.1 x10E3/uL (ref 0.0–0.4)
Eos: 2 %
Hematocrit: 37 % (ref 34.0–46.6)
Hemoglobin: 11.5 g/dL (ref 11.1–15.9)
Immature Grans (Abs): 0 x10E3/uL (ref 0.0–0.1)
Immature Granulocytes: 0 %
Lymphocytes Absolute: 1.8 x10E3/uL (ref 0.7–3.1)
Lymphs: 29 %
MCH: 26.2 pg — ABNORMAL LOW (ref 26.6–33.0)
MCHC: 31.1 g/dL — ABNORMAL LOW (ref 31.5–35.7)
MCV: 84 fL (ref 79–97)
Monocytes Absolute: 0.4 x10E3/uL (ref 0.1–0.9)
Monocytes: 6 %
Neutrophils Absolute: 3.9 x10E3/uL (ref 1.4–7.0)
Neutrophils: 62 %
Platelets: 313 x10E3/uL (ref 150–450)
RBC: 4.39 x10E6/uL (ref 3.77–5.28)
RDW: 22.5 % — ABNORMAL HIGH (ref 11.7–15.4)
WBC: 6.3 x10E3/uL (ref 3.4–10.8)

## 2024-08-04 LAB — CMP14+EGFR
ALT: 13 IU/L (ref 0–32)
AST: 15 IU/L (ref 0–40)
Albumin: 4.3 g/dL (ref 3.9–4.9)
Alkaline Phosphatase: 94 IU/L (ref 49–135)
BUN/Creatinine Ratio: 17 (ref 12–28)
BUN: 16 mg/dL (ref 8–27)
Bilirubin Total: 0.4 mg/dL (ref 0.0–1.2)
CO2: 23 mmol/L (ref 20–29)
Calcium: 9.3 mg/dL (ref 8.7–10.3)
Chloride: 102 mmol/L (ref 96–106)
Creatinine, Ser: 0.92 mg/dL (ref 0.57–1.00)
Globulin, Total: 3.2 g/dL (ref 1.5–4.5)
Glucose: 78 mg/dL (ref 70–99)
Potassium: 4.2 mmol/L (ref 3.5–5.2)
Sodium: 139 mmol/L (ref 134–144)
Total Protein: 7.5 g/dL (ref 6.0–8.5)
eGFR: 68 mL/min/1.73 (ref >59)

## 2024-08-04 LAB — LIPID PANEL
Chol/HDL Ratio: 3.7 ratio (ref 0.0–4.4)
Cholesterol, Total: 241 mg/dL — ABNORMAL HIGH (ref 100–199)
HDL: 66 mg/dL (ref >39)
LDL Chol Calc (NIH): 161 mg/dL — ABNORMAL HIGH (ref 0–99)
Triglycerides: 82 mg/dL (ref 0–149)
VLDL Cholesterol Cal: 14 mg/dL (ref 5–40)

## 2024-08-04 NOTE — Telephone Encounter (Signed)
 Copied from CRM #[id number]. Topic: Clinical - Medical Advice >> Aug 04, 2024 11:42 AM Nathanel BROCKS wrote: Reason for CRM: spoke with pt regarding labs and she is willing to take cholesterol medication, please send to pharmacy. Pt is willing to send in a urine sample for culture to check for uti. Her daughter will bring it in.    ----------------------------------------------------------------------- From previous Reason for Contact - Lab/Test Results: Reason for CRM:

## 2024-08-05 ENCOUNTER — Other Ambulatory Visit: Payer: Self-pay

## 2024-08-05 ENCOUNTER — Telehealth: Payer: Self-pay

## 2024-08-05 NOTE — Telephone Encounter (Signed)
 Refer to [employer] from 10/16- waiting on provider to advise

## 2024-08-05 NOTE — Telephone Encounter (Signed)
 Copied from CRM #[id number]. Topic: Clinical - Medical Advice >> Aug 05, 2024 12:53 PM Antwanette L wrote: The patient is calling back because Dr. Zollie was supposed to send in an order for cholesterol medication, but [facility] has not received it.

## 2024-08-15 ENCOUNTER — Telehealth: Payer: Self-pay | Admitting: Family Medicine

## 2024-08-15 ENCOUNTER — Other Ambulatory Visit: Payer: Self-pay

## 2024-08-15 ENCOUNTER — Other Ambulatory Visit

## 2024-08-15 DIAGNOSIS — R8290 Unspecified abnormal findings in urine: Secondary | ICD-10-CM

## 2024-08-15 NOTE — Telephone Encounter (Signed)
 Copied from CRM [id number]. Topic: Clinical - Request for Lab/Test Order >> Aug 15, 2024 11:27 AM Tobias CROME wrote: Reason for CRM: Patient states she is supposed to drop off a urine sample for a urine culture. Patient states she will not be able to drop off the urine sample during drop off times.   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

## 2024-08-15 NOTE — Progress Notes (Signed)
 Urine culture

## 2024-08-15 NOTE — Telephone Encounter (Signed)
 Orders placed and released to [facility]. Pt notified via voicemail.LS

## 2024-08-15 NOTE — Telephone Encounter (Signed)
 Copied from CRM [id number]. Topic: Clinical - Medication Question >> Aug 15, 2024 11:29 AM Tobias CROME wrote: Reason for CRM: Patient states she checked with pharmacy to see if new medication was available. Patient informed they did not have a new prescription. Dr. Zollie requested for patient to start new cholesterol medication, patient unsure of the name.   Patient inquiring if prescription could be sent in [facility].

## 2024-08-17 ENCOUNTER — Other Ambulatory Visit: Payer: Self-pay | Admitting: Family Medicine

## 2024-08-17 ENCOUNTER — Other Ambulatory Visit: Payer: Self-pay

## 2024-08-17 MED ORDER — ROSUVASTATIN CALCIUM 10 MG PO TABS
10.0000 mg | ORAL_TABLET | Freq: Every day | ORAL | 1 refills | Status: AC
  Filled 2024-08-17 (×2): qty 90, 90d supply, fill #0
  Filled 2024-11-23: qty 90, 90d supply, fill #1

## 2024-08-17 NOTE — Telephone Encounter (Signed)
 Patient notified.Marland Kitchen LS

## 2024-08-17 NOTE — Telephone Encounter (Signed)
 Scrip sent to [facility] on [address]

## 2024-08-18 ENCOUNTER — Other Ambulatory Visit: Payer: Self-pay

## 2024-08-22 DIAGNOSIS — R8290 Unspecified abnormal findings in urine: Secondary | ICD-10-CM | POA: Diagnosis not present

## 2024-08-24 ENCOUNTER — Ambulatory Visit: Payer: Self-pay | Admitting: Family Medicine

## 2024-08-24 LAB — URINE CULTURE

## 2024-09-20 ENCOUNTER — Other Ambulatory Visit: Payer: Self-pay | Admitting: Family Medicine

## 2024-09-20 ENCOUNTER — Other Ambulatory Visit: Payer: Self-pay

## 2024-09-20 DIAGNOSIS — I10 Essential (primary) hypertension: Secondary | ICD-10-CM

## 2024-09-20 MED ORDER — LISINOPRIL 5 MG PO TABS
5.0000 mg | ORAL_TABLET | Freq: Every day | ORAL | 1 refills | Status: AC
  Filled 2024-09-20: qty 90, 90d supply, fill #0
  Filled 2024-11-23: qty 90, 90d supply, fill #1

## 2024-10-19 ENCOUNTER — Telehealth: Admitting: Nurse Practitioner

## 2024-10-19 ENCOUNTER — Other Ambulatory Visit: Payer: Self-pay

## 2024-10-19 DIAGNOSIS — J0140 Acute pansinusitis, unspecified: Secondary | ICD-10-CM

## 2024-10-19 MED ORDER — AZITHROMYCIN 250 MG PO TABS
ORAL_TABLET | ORAL | 0 refills | Status: AC
  Filled 2024-10-19: qty 6, 5d supply, fill #0

## 2024-10-19 MED ORDER — IPRATROPIUM BROMIDE 0.03 % NA SOLN
2.0000 | Freq: Two times a day (BID) | NASAL | 12 refills | Status: AC
  Filled 2024-10-19: qty 30, 75d supply, fill #0

## 2024-10-19 NOTE — Progress Notes (Signed)
"      E-Visit for Sinus Problems  We are sorry that you are not feeling well.  Here is how we plan to help!  Based on what you have shared with me it looks like you have sinusitis.  Sinusitis is inflammation and infection in the sinus cavities of the head.  Based on your presentation I believe you most likely have Acute Bacterial Sinusitis.  This is an infection caused by bacteria and is treated with antibiotics. I have prescribed Azithromycin daily for 5 days. and I have also prescribed Ipratropium Bromide Nasal Spray Use 1 spray in each nostril twice daily as needed for drainage; discontinue if too drying You may use an oral decongestant such as Mucinex D or if you have glaucoma or high blood pressure use plain Mucinex. Saline nasal spray help and can safely be used as often as needed for congestion.  If you develop worsening sinus pain, fever or notice severe headache and vision changes, or if symptoms are not better after completion of antibiotic, please schedule an appointment with a health care provider.    Sinus infections are not as easily transmitted as other respiratory infection, however we still recommend that you avoid close contact with loved ones, especially the very young and elderly.  Remember to wash your hands thoroughly throughout the day as this is the number one way to prevent the spread of infection!  Home Care: Only take medications as instructed by your medical team. Complete the entire course of an antibiotic. Do not take these medications with alcohol. A steam or ultrasonic humidifier can help congestion.  You can place a towel over your head and breathe in the steam from hot water  coming from a faucet. Avoid close contacts especially the very young and the elderly. Cover your mouth when you cough or sneeze. Always remember to wash your hands.  Get Help Right Away If: You develop worsening fever or sinus pain. You develop a severe head ache or visual changes. Your  symptoms persist after you have completed your treatment plan.  Make sure you Understand these instructions. Will watch your condition. Will get help right away if you are not doing well or get worse.  Your e-visit answers were reviewed by a board certified advanced clinical practitioner to complete your personal care plan.  Depending on the condition, your plan could have included both over the counter or prescription medications.  If there is a problem please reply  once you have received a response from your provider.  Your safety is important to us .  If you have drug allergies check your prescription carefully.    You can use MyChart to ask questions about todays visit, request a non-urgent call back, or ask for a work or school excuse for 24 hours related to this e-Visit. If it has been greater than 24 hours you will need to follow up with your provider, or enter a new e-Visit to address those concerns.  You will get an e-mail in the next two days asking about your experience.  I hope that your e-visit has been valuable and will speed your recovery. Thank you for using e-visits.  I have spent 5 minutes in review of e-visit questionnaire, review and updating patient chart, medical decision making and response to patient.   Lauraine Kitty, FNP "

## 2024-11-24 ENCOUNTER — Other Ambulatory Visit: Payer: Self-pay

## 2025-05-18 ENCOUNTER — Ambulatory Visit: Admitting: Endocrinology

## 2025-08-10 ENCOUNTER — Encounter: Payer: Self-pay | Admitting: Family Medicine
